# Patient Record
Sex: Female | Born: 1950 | Race: Black or African American | Hispanic: No | Marital: Single | Smoking: Never smoker
Health system: Southern US, Community
[De-identification: ages and names within clinical notes are randomized; demographics above are authoritative.]

## PROBLEM LIST (undated history)

## (undated) DIAGNOSIS — I773 Arterial fibromuscular dysplasia: Secondary | ICD-10-CM

## (undated) DIAGNOSIS — E7800 Pure hypercholesterolemia, unspecified: Secondary | ICD-10-CM

## (undated) DIAGNOSIS — G7102 Facioscapulohumeral muscular dystrophy: Secondary | ICD-10-CM

## (undated) DIAGNOSIS — K219 Gastro-esophageal reflux disease without esophagitis: Secondary | ICD-10-CM

## (undated) DIAGNOSIS — K635 Polyp of colon: Secondary | ICD-10-CM

## (undated) DIAGNOSIS — F419 Anxiety disorder, unspecified: Secondary | ICD-10-CM

## (undated) DIAGNOSIS — R51 Headache: Secondary | ICD-10-CM

## (undated) DIAGNOSIS — C50919 Malignant neoplasm of unspecified site of unspecified female breast: Secondary | ICD-10-CM

## (undated) DIAGNOSIS — Z923 Personal history of irradiation: Secondary | ICD-10-CM

## (undated) DIAGNOSIS — I10 Essential (primary) hypertension: Secondary | ICD-10-CM

## (undated) DIAGNOSIS — L501 Idiopathic urticaria: Secondary | ICD-10-CM

## (undated) HISTORY — PX: OTHER SURGICAL HISTORY: SHX169

## (undated) HISTORY — DX: Anxiety disorder, unspecified: F41.9

## (undated) HISTORY — DX: Essential (primary) hypertension: I10

## (undated) HISTORY — DX: Headache: R51

## (undated) HISTORY — DX: Pure hypercholesterolemia, unspecified: E78.00

## (undated) HISTORY — DX: Idiopathic urticaria: L50.1

## (undated) HISTORY — DX: Malignant neoplasm of unspecified site of unspecified female breast: C50.919

## (undated) HISTORY — PX: AUGMENTATION MAMMAPLASTY: SUR837

## (undated) HISTORY — PX: BREAST LUMPECTOMY: SHX2

## (undated) HISTORY — DX: Polyp of colon: K63.5

## (undated) HISTORY — DX: Facioscapulohumeral muscular dystrophy: G71.02

## (undated) HISTORY — DX: Gastro-esophageal reflux disease without esophagitis: K21.9

## (undated) HISTORY — PX: VAGINAL HYSTERECTOMY: SHX2639

## (undated) HISTORY — PX: POLYPECTOMY: SHX149

---

## 1999-05-19 ENCOUNTER — Other Ambulatory Visit: Admission: RE | Admit: 1999-05-19 | Discharge: 1999-05-19 | Payer: Self-pay | Admitting: Obstetrics and Gynecology

## 2000-10-17 ENCOUNTER — Other Ambulatory Visit: Admission: RE | Admit: 2000-10-17 | Discharge: 2000-10-17 | Payer: Self-pay | Admitting: Obstetrics and Gynecology

## 2001-12-13 ENCOUNTER — Other Ambulatory Visit: Admission: RE | Admit: 2001-12-13 | Discharge: 2001-12-13 | Payer: Self-pay | Admitting: Obstetrics and Gynecology

## 2005-04-05 ENCOUNTER — Ambulatory Visit: Payer: Self-pay | Admitting: Pulmonary Disease

## 2005-04-26 ENCOUNTER — Ambulatory Visit: Payer: Self-pay | Admitting: Gastroenterology

## 2005-05-09 ENCOUNTER — Ambulatory Visit: Payer: Self-pay | Admitting: Gastroenterology

## 2005-07-04 ENCOUNTER — Encounter (INDEPENDENT_AMBULATORY_CARE_PROVIDER_SITE_OTHER): Payer: Self-pay | Admitting: *Deleted

## 2005-07-04 ENCOUNTER — Ambulatory Visit (HOSPITAL_COMMUNITY): Admission: RE | Admit: 2005-07-04 | Discharge: 2005-07-04 | Payer: Self-pay | Admitting: Surgery

## 2005-12-09 ENCOUNTER — Ambulatory Visit: Payer: Self-pay | Admitting: Pulmonary Disease

## 2005-12-10 ENCOUNTER — Ambulatory Visit (HOSPITAL_COMMUNITY): Admission: RE | Admit: 2005-12-10 | Discharge: 2005-12-10 | Payer: Self-pay | Admitting: Pulmonary Disease

## 2006-12-16 ENCOUNTER — Emergency Department (HOSPITAL_COMMUNITY): Admission: EM | Admit: 2006-12-16 | Discharge: 2006-12-17 | Payer: Self-pay | Admitting: Emergency Medicine

## 2007-07-27 ENCOUNTER — Ambulatory Visit: Payer: Self-pay | Admitting: Pulmonary Disease

## 2007-12-20 DIAGNOSIS — K219 Gastro-esophageal reflux disease without esophagitis: Secondary | ICD-10-CM | POA: Insufficient documentation

## 2007-12-20 DIAGNOSIS — F411 Generalized anxiety disorder: Secondary | ICD-10-CM

## 2007-12-20 DIAGNOSIS — R51 Headache: Secondary | ICD-10-CM

## 2007-12-20 DIAGNOSIS — I10 Essential (primary) hypertension: Secondary | ICD-10-CM

## 2007-12-20 DIAGNOSIS — E7800 Pure hypercholesterolemia, unspecified: Secondary | ICD-10-CM

## 2007-12-20 DIAGNOSIS — R519 Headache, unspecified: Secondary | ICD-10-CM | POA: Insufficient documentation

## 2008-01-24 ENCOUNTER — Ambulatory Visit: Payer: Self-pay | Admitting: Pulmonary Disease

## 2008-01-24 DIAGNOSIS — I8390 Asymptomatic varicose veins of unspecified lower extremity: Secondary | ICD-10-CM

## 2008-01-24 DIAGNOSIS — L501 Idiopathic urticaria: Secondary | ICD-10-CM

## 2008-01-24 DIAGNOSIS — D126 Benign neoplasm of colon, unspecified: Secondary | ICD-10-CM | POA: Insufficient documentation

## 2008-01-28 ENCOUNTER — Ambulatory Visit: Payer: Self-pay | Admitting: Pulmonary Disease

## 2008-02-12 LAB — CONVERTED CEMR LAB
ALT: 22 units/L (ref 0–35)
AST: 29 units/L (ref 0–37)
Albumin: 3.5 g/dL (ref 3.5–5.2)
Alkaline Phosphatase: 73 units/L (ref 39–117)
BUN: 8 mg/dL (ref 6–23)
Basophils Absolute: 0 10*3/uL (ref 0.0–0.1)
Basophils Relative: 0.1 % (ref 0.0–1.0)
Bilirubin, Direct: 0.2 mg/dL (ref 0.0–0.3)
CO2: 33 meq/L — ABNORMAL HIGH (ref 19–32)
Calcium: 9.2 mg/dL (ref 8.4–10.5)
Chloride: 101 meq/L (ref 96–112)
Cholesterol: 234 mg/dL (ref 0–200)
Creatinine, Ser: 0.8 mg/dL (ref 0.4–1.2)
Direct LDL: 136.4 mg/dL
Eosinophils Absolute: 0.1 10*3/uL (ref 0.0–0.7)
Eosinophils Relative: 1.6 % (ref 0.0–5.0)
GFR calc Af Amer: 96 mL/min
GFR calc non Af Amer: 79 mL/min
Glucose, Bld: 83 mg/dL (ref 70–99)
HCT: 41.8 % (ref 36.0–46.0)
HDL: 88.8 mg/dL (ref >39.0)
Hemoglobin: 14 g/dL (ref 12.0–15.0)
Lymphocytes Relative: 22.7 % (ref 12.0–46.0)
MCHC: 33.6 g/dL (ref 30.0–36.0)
MCV: 91.5 fL (ref 78.0–100.0)
Monocytes Absolute: 0.3 10*3/uL (ref 0.1–1.0)
Monocytes Relative: 6.5 % (ref 3.0–12.0)
Neutro Abs: 3.2 10*3/uL (ref 1.4–7.7)
Neutrophils Relative %: 69.1 % (ref 43.0–77.0)
Platelets: 318 10*3/uL (ref 150–400)
Potassium: 4.1 meq/L (ref 3.5–5.1)
RBC: 4.56 M/uL (ref 3.87–5.11)
RDW: 12 % (ref 11.5–14.6)
Sodium: 141 meq/L (ref 135–145)
TSH: 1.5 microintl units/mL (ref 0.35–5.50)
Total Bilirubin: 0.8 mg/dL (ref 0.3–1.2)
Total CHOL/HDL Ratio: 2.6
Total Protein: 7 g/dL (ref 6.0–8.3)
Triglycerides: 67 mg/dL (ref 0–149)
VLDL: 13 mg/dL (ref 0–40)
WBC: 4.6 10*3/uL (ref 4.5–10.5)

## 2009-01-21 ENCOUNTER — Encounter: Payer: Self-pay | Admitting: Pulmonary Disease

## 2009-01-29 ENCOUNTER — Encounter: Payer: Self-pay | Admitting: Pulmonary Disease

## 2009-01-30 ENCOUNTER — Encounter: Payer: Self-pay | Admitting: Pulmonary Disease

## 2009-03-30 ENCOUNTER — Telehealth (INDEPENDENT_AMBULATORY_CARE_PROVIDER_SITE_OTHER): Payer: Self-pay | Admitting: *Deleted

## 2009-07-14 ENCOUNTER — Ambulatory Visit: Payer: Self-pay | Admitting: Pulmonary Disease

## 2009-07-17 ENCOUNTER — Ambulatory Visit: Payer: Self-pay | Admitting: Pulmonary Disease

## 2009-07-19 LAB — CONVERTED CEMR LAB
ALT: 15 units/L (ref 0–35)
AST: 25 units/L (ref 0–37)
Albumin: 3.8 g/dL (ref 3.5–5.2)
Alkaline Phosphatase: 68 units/L (ref 39–117)
BUN: 13 mg/dL (ref 6–23)
Basophils Absolute: 0 10*3/uL (ref 0.0–0.1)
Basophils Relative: 0 % (ref 0.0–3.0)
Bilirubin, Direct: 0.1 mg/dL (ref 0.0–0.3)
CO2: 31 meq/L (ref 19–32)
Calcium: 9.1 mg/dL (ref 8.4–10.5)
Chloride: 102 meq/L (ref 96–112)
Cholesterol: 222 mg/dL — ABNORMAL HIGH (ref 0–200)
Creatinine, Ser: 0.8 mg/dL (ref 0.4–1.2)
Direct LDL: 118 mg/dL
Eosinophils Absolute: 0.1 10*3/uL (ref 0.0–0.7)
Eosinophils Relative: 1.6 % (ref 0.0–5.0)
GFR calc non Af Amer: 95.3 mL/min (ref >60)
Glucose, Bld: 81 mg/dL (ref 70–99)
HCT: 36.6 % (ref 36.0–46.0)
HDL: 91.8 mg/dL (ref >39.00)
Hemoglobin: 12.6 g/dL (ref 12.0–15.0)
Lymphocytes Relative: 22.4 % (ref 12.0–46.0)
Lymphs Abs: 1 10*3/uL (ref 0.7–4.0)
MCHC: 34.5 g/dL (ref 30.0–36.0)
MCV: 92.5 fL (ref 78.0–100.0)
Monocytes Absolute: 0.3 10*3/uL (ref 0.1–1.0)
Monocytes Relative: 7.9 % (ref 3.0–12.0)
Neutro Abs: 2.9 10*3/uL (ref 1.4–7.7)
Neutrophils Relative %: 68.1 % (ref 43.0–77.0)
Platelets: 255 10*3/uL (ref 150.0–400.0)
Potassium: 3.8 meq/L (ref 3.5–5.1)
RBC: 3.95 M/uL (ref 3.87–5.11)
RDW: 11.8 % (ref 11.5–14.6)
Sodium: 145 meq/L (ref 135–145)
TSH: 0.97 microintl units/mL (ref 0.35–5.50)
Total Bilirubin: 0.6 mg/dL (ref 0.3–1.2)
Total CHOL/HDL Ratio: 2
Total Protein: 7.6 g/dL (ref 6.0–8.3)
Triglycerides: 63 mg/dL (ref 0.0–149.0)
VLDL: 12.6 mg/dL (ref 0.0–40.0)
WBC: 4.3 10*3/uL — ABNORMAL LOW (ref 4.5–10.5)

## 2009-10-15 ENCOUNTER — Telehealth (INDEPENDENT_AMBULATORY_CARE_PROVIDER_SITE_OTHER): Payer: Self-pay | Admitting: *Deleted

## 2010-01-01 ENCOUNTER — Encounter: Payer: Self-pay | Admitting: Pulmonary Disease

## 2010-07-14 ENCOUNTER — Ambulatory Visit: Payer: Self-pay | Admitting: Pulmonary Disease

## 2010-07-14 ENCOUNTER — Encounter (INDEPENDENT_AMBULATORY_CARE_PROVIDER_SITE_OTHER): Payer: Self-pay | Admitting: *Deleted

## 2010-07-30 ENCOUNTER — Encounter (INDEPENDENT_AMBULATORY_CARE_PROVIDER_SITE_OTHER): Payer: Self-pay | Admitting: *Deleted

## 2010-08-04 ENCOUNTER — Ambulatory Visit: Payer: Self-pay | Admitting: Gastroenterology

## 2010-09-19 HISTORY — PX: COLONOSCOPY: SHX5424

## 2010-10-11 ENCOUNTER — Encounter: Payer: Self-pay | Admitting: Obstetrics and Gynecology

## 2010-10-19 NOTE — Miscellaneous (Signed)
Summary: [facility] Previsit/prep  Clinical Lists Changes  Medications: Added new medication of MOVIPREP 100 GM  SOLR (PEG-KCL-NACL-NASULF-NA ASC-C) As per prep instructions. - Signed Rx of MOVIPREP 100 GM  SOLR (PEG-KCL-NACL-NASULF-NA ASC-C) As per prep instructions.;  #1 x 0;  Signed;  Entered by: Wyona Almas RN;  Authorized by: Mardella Layman MD [facility];  Method used: [facility] [phone]*, [address] [place], [place]  [address], Ph: [phone] or [phone], Fax: [phone] Observations: Added new observation of NKA: T (08/04/2010 8:55)    Prescriptions: MOVIPREP 100 GM  SOLR (PEG-KCL-NACL-NASULF-NA ASC-C) As per prep instructions.  #1 x 0   Entered by:   Wyona Almas RN   Authorized by:   Mardella Layman MD [facility]   Signed by:   Wyona Almas RN on 08/04/2010   Method used:   [facility]ronically to        [facility] [phone]* (retail)       [address]       [place], [place]  [address]       Ph: [phone] or [phone]       Fax: [phone]   RxID:   [id number]

## 2010-10-19 NOTE — Letter (Signed)
Summary: Moviprep Instructions  [facility]  [address]   [place], [place] [address]   Phone: [phone]  Fax: [phone]       TENIESHA STAUCH    26-May-1951    MRN: [id number]        Procedure Day Dorna Bloom: Wednesday, 08-25-10     Arrival Time: 8:30 a.m.     Procedure Time: 9:30 a.m.     Location of Procedure:                    x   [facility] (4th Floor)  PREPARATION FOR COLONOSCOPY WITH MOVIPREP   Starting 5 days prior to your procedure 08-20-10 do not eat nuts, seeds, popcorn, corn, beans, peas,  salads, or any raw vegetables.  Do not take any fiber supplements (e.g. Metamucil, Citrucel, and Benefiber).  THE DAY BEFORE YOUR PROCEDURE         DATE: 08-24-10   DAY: Tuesday  1.  Drink clear liquids the entire day-NO SOLID FOOD  2.  Do not drink anything colored red or purple.  Avoid juices with pulp.  No orange juice.  3.  Drink at least 64 oz. (8 glasses) of fluid/clear liquids during the day to prevent dehydration and help the prep work efficiently.  CLEAR LIQUIDS INCLUDE: Water Jello Ice Popsicles Tea (sugar ok, no milk/cream) Powdered fruit flavored drinks Coffee (sugar ok, no milk/cream) Gatorade Juice: apple, white grape, white cranberry  Lemonade Clear bullion, consomm, broth Carbonated beverages (any kind) Strained chicken noodle soup Hard Candy                             4.  In the morning, mix first dose of MoviPrep solution:    Empty 1 Pouch A and 1 Pouch B into the disposable container    Add lukewarm drinking water to the top line of the container. Mix to dissolve    Refrigerate (mixed solution should be used within 24 hrs)  5.  Begin drinking the prep at 5:00 p.m. The MoviPrep container is divided by 4 marks.   Every 15 minutes drink the solution down to the next mark (approximately 8 oz) until the full liter is complete.   6.  Follow completed prep with 16 oz of clear liquid of your choice (Nothing red or purple).   Continue to drink clear liquids until bedtime.  7.  Before going to bed, mix second dose of MoviPrep solution:    Empty 1 Pouch A and 1 Pouch B into the disposable container    Add lukewarm drinking water to the top line of the container. Mix to dissolve    Refrigerate  THE DAY OF YOUR PROCEDURE      DATE: 08-25-10  DAY: Wednesday  Beginning at 4:30 a.m. (5 hours before procedure):         1. Every 15 minutes, drink the solution down to the next mark (approx 8 oz) until the full liter is complete.  2. Follow completed prep with 16 oz. of clear liquid of your choice.    3. You may drink clear liquids until  7:30 a.m. (2 HOURS BEFORE PROCEDURE).   MEDICATION INSTRUCTIONS  Unless otherwise instructed, you should take regular prescription medications with a small sip of water   as early as possible the morning of your procedure.   Additional medication instructions: Hold HCTZ the morning of procedure.  OTHER INSTRUCTIONS  You will need a responsible adult at least 58 years of age to accompany you and drive you home.   This person must remain in the waiting room during your procedure.  Wear loose fitting clothing that is easily removed.  Leave jewelry and other valuables at home.  However, you may wish to bring a book to read or  an iPod/MP3 player to listen to music as you wait for your procedure to start.  Remove all body piercing jewelry and leave at home.  Total time from sign-in until discharge is approximately 2-3 hours.  You should go home directly after your procedure and rest.  You can resume normal activities the  day after your procedure.  The day of your procedure you should not:   Drive   Make legal decisions   Operate machinery   Drink alcohol   Return to work  You will receive specific instructions about eating, activities and medications before you leave.    The above instructions have been reviewed and explained to me by   Wyona Almas RN  August 04, 2010 9:30 AM    I fully understand and can verbalize these instructions _____________________________ Date _________

## 2010-10-19 NOTE — Progress Notes (Signed)
Summary: Change Avalide-LMTCB x 2  Phone Note Outgoing Call   Call placed by: Michel Bickers CMA,  October 15, 2009 5:05 PM Call placed to: Patient Summary of Call: Form received from the pharmacy requesting to change Avalide 150-12.5mg  due to recall. They are wanting to give the pt Avapro 150mg  and HCTZ 12.5mg  separately. SN is ok with this if the patient agrees. There are other alternate combination therapies if the patient wants to contiune on the combination therapy.LMOMTCB.  LMOMTCB Vernie Murders  October 16, 2009 9:32 AM  Initial call taken by: Michel Bickers CMA,  October 15, 2009 5:08 PM  Follow-up for Phone Call        called and spoke with pt and the pharmacy has not contacted her about this but she is fine with the change in meds if its ok with SN.. please advise Randell Loop CMA  October 16, 2009 10:52 AM    this was ok with SN to change this one med to the 2 meds and they have been sent to her pharmacy. lmom to make pt aware of the change. Randell Loop CMA  October 16, 2009 11:14 AM     New/Updated Medications: AVAPRO 150 MG TABS (IRBESARTAN) Take 1 tablet by mouth once a day HYDROCHLOROTHIAZIDE 12.5 MG CAPS (HYDROCHLOROTHIAZIDE) take one capsule by mouth once daily Prescriptions: HYDROCHLOROTHIAZIDE 12.5 MG CAPS (HYDROCHLOROTHIAZIDE) take one capsule by mouth once daily  #30 x 6   Entered by:   Randell Loop CMA   Authorized by:   Michele Mcalpine MD   Signed by:   Randell Loop CMA on 10/16/2009   Method used:   [facility]        [facility] [phone]* (retail)       [address]       [place], [place]  [address]       Ph: [phone] or [phone]       Fax: [phone]   RxID:   [id number] AVAPRO 150 MG TABS (IRBESARTAN) Take 1 tablet by mouth once a day  #30 x 6   Entered by:   Randell Loop CMA   Authorized by:   Michele Mcalpine MD   Signed by:   Randell Loop CMA on 10/16/2009   Method used:   Electronically to        [facility] [phone]* (retail)  [address]       [place], [place]  [address]       Ph: [phone] or [phone]       Fax: [phone]   RxID:   [id number]

## 2010-10-19 NOTE — Assessment & Plan Note (Signed)
Summary: 12 months/apc-pt req flu shot as well//kp   CC:  Yearly ROV & review of mult medical problems....  History of Present Illness: 60 y/o BF here for a follow up visit... she has mult med problems as noted....    ~  July 14, 2009:  she has been under some stress caring for her mother, Janyth Pupa... she is c/o being tired- not weak, exercises daily by walking her dog (husky)... BP well controlled on the Avalide & overdue for FLP to check her cholesterol... OK flu shot today.   ~  July 14, 2010:  she has had a good yr- no new complaints or concerns, just incr stress w/ mother... her insurance comp changed her Avalide to Avapro + HCTZ for some reason- BP reamins well controlled;  she has hx sl elev TChol/ LDL but good HDL & she doesn't want meds Rx (committed to diet alone);  weight= 126# is down 9# in the last yr;  GI is stable & she is due for 27yr f/u colon- refer to DrPatterson;  she is on Premarin, black cohosh, calcium, vits per GYN... OK 2011 Flu vaccine & TDAP booster today...    Current Problem List:  HYPERTENSION (ICD-401.9) - on AVAPRO 150mg /d + HCTZ 12.5mg /d w/ good control... BP= 126/78, tolerating rx well... denies HA, fatigue, visual changes, CP, palipit, dizziness, syncope, dyspnea, edema, etc...  VARICOSE VEINS, LOWER EXTREMITIES (ICD-454.9) - she has been eval at the Vein clinic and had VV surg by DrFeatherston... she avoids sodium, elevates legs, wears support hose...  HYPERCHOLESTEROLEMIA (ICD-272.0) - on diet alone and has a high HDL...  ~  last FLP was 6/05 showing TChol 236, Tg 148, HDL 83, LDL 125... I told her she may need a low dose statin med to get her TChol & LDL to goal...  ~  FLP 10/10 showed TChol 222, TG 63, HDL 92, LDL 118... rec better diet efforit.  GERD (ICD-530.81) - occas reflux symptoms treated w/ Prilosec vs Zantac OTC...  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 follow up planned 78yrs & we will refer to GI for this important screening procedure.  HEADACHE (ICD-784.0) - she believes related to stress...  ANXIETY (ICD-300.00) - she uses LORAZEPAM 0.5mg  as needed for anxiety... eg. during storms etc...  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  Health Maintenance:  Also takes ASA 81mg /d... GYN= DrHorvath, on Premarin 0.625mg /d, s/p hysterectomy... Mammograms yearly at [facility] (she has implants)- and they follow closely... she gets the seasonal Flu vaccine yearly... given TDAP booster 10/11...   Preventive Screening-Counseling & Management  Alcohol-Tobacco     Smoking Status: never  Allergies (verified): No Known Drug Allergies  Comments:  Nurse/Medical Assistant: The patient's medications and allergies were reviewed with the patient and were updated in the Medication and Allergy Lists.  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 Father alive age 27 w/ hx ASHD, back surg Mother, Addrenna  Rivers, alive age 60 w/ HBP, Chol, DM, breast cancer, DVT/PE... 3 Siblings- all sisters, one w/ back surg  Social History: Reviewed history from 07/14/2009 and no changes required. Divorced No children never smoked social alcohol coffee- 3 cups  Review of Systems  The patient denies anorexia, fever, weight loss, weight  gain, vision loss, decreased hearing, hoarseness, chest pain, syncope, dyspnea on exertion, peripheral edema, prolonged cough, headaches, hemoptysis, abdominal pain, melena, hematochezia, severe indigestion/heartburn, hematuria, incontinence, muscle weakness, suspicious skin lesions, transient blindness, difficulty walking, depression, unusual weight change, abnormal bleeding, enlarged lymph nodes, and angioedema.    Vital Signs:  Patient profile:   60 year old female Height:      63 inches Weight:      125.50 pounds BMI:     22.31 O2 Sat:      100 % on Room air Temp:     98.5 degrees F oral Pulse rate:   72 / minute BP sitting:   126 / 78  (left arm) Cuff size:   regular  Vitals Entered By: Randell Loop CMA (July 14, 2010 2:05 PM)  O2 Sat at Rest %:  100 O2 Flow:  Room air CC: Yearly ROV & review of mult medical problems... Is Patient Diabetic? No Pain Assessment Patient in pain? no      Comments meds updated today with pt   Physical Exam  Additional Exam:  WD, WN, 59 y/o BF in NAD... GENERAL:  Alert & oriented; pleasant & cooperative... HEENT:  Cecilia/AT, EOM-wnl, PERRLA, EACs-clear, TMs-wnl, NOSE-clear, THROAT-clear & wnl. NECK:  Supple w/ full ROM; no JVD; normal carotid impulses w/o bruits; no thyromegaly or nodules palpated; no lymphadenopathy. CHEST:  Clear to P & A; without wheezes/ rales/ or rhonchi. HEART:  Regular Rhythm; without murmurs/ rubs/ or gallops. ABDOMEN:  Soft & nontender; normal bowel sounds; no organomegaly or masses detected. EXT: without deformities or arthritic changes; no varicose veins/ venous insuffic/ or edema. NEURO:  CN's intact; motor testing normal; sensory testing normal; gait normal & balance OK. DERM:  No lesions noted; no rash etc...    Impression & Recommendations:  Problem # 1:  HYPERTENSION (ICD-401.9) Well controlled>  continue same. Her updated medication list for this problem includes:    Avapro 150 Mg Tabs (Irbesartan) .Marland Kitchen...  Take 1 tablet by mouth once a day    Hydrochlorothiazide 12.5 Mg Caps (Hydrochlorothiazide) .Marland Kitchen... Take one capsule by mouth once daily  Problem # 2:  HYPERCHOLESTEROLEMIA (ICD-272.0) She has elev TChol & LDL, but excellent HDL & is not inclined to take meds... discussed low chol/ low fat diet & exercise...  Problem # 3:  COLONIC POLYPS (ICD-211.3) She is due for colonoscopy w/ DrPatterson & has not received notification from the GI dept- we will refer for this important sceeening procedure. Orders: Gastroenterology Referral (GI)  Problem # 4:  OTHER MEDICAL PROBLEMS AS NOTED>>> OK Flu vaccine & TDAP today...  Complete Medication List: 1)  Avapro 150 Mg Tabs (Irbesartan) .... Take 1 tablet by mouth once a day 2)  Hydrochlorothiazide 12.5 Mg Caps (Hydrochlorothiazide) .... Take one capsule by mouth once daily 3)  Premarin 0.625 Mg Tabs (Estrogens conjugated) .... Take 1/2 tablet by mouth once a day 4)  Black Cohosh 200 Mg Caps (Black cohosh) .... Take 1 tablet by mouth once a day 5)  Lorazepam 0.5 Mg Tabs (Lorazepam) .... Take 1/2 to 1 tab by mouth up to three times a day as needed for nerves...  Other Orders: Tdap =>  40yrs IM [phone]) Admin 1st Vaccine ([address]454) Admin 1st Vaccine (09811) Flu Vaccine 97yrs + [phone])  Patient Instructions: 1)  Today we updated your med list- see below.... 2)  we refilled your perscriptions as requested... 3)  today we gave you the 2011 Flu vaccine and the TDAP (combination tetanus & whooping cough vaccine- good for 37yrs)... 4)  Call for any problems.Marland KitchenMarland Kitchen 5)  We will ask DrPatterson to check into your f/u colonoscopy... 6)  Please schedule a follow-up appointment in 1 year. Prescriptions: LORAZEPAM 0.5 MG TABS (LORAZEPAM) take 1/2 to 1 tab by mouth up to three times a day as needed for nerves...  #90 x 12   Entered and Authorized by:   Michele Mcalpine MD   Signed by:   Michele Mcalpine MD on 07/14/2010   Method used:   Print then Give to Patient   RxID:    [id number] HYDROCHLOROTHIAZIDE 12.5 MG CAPS (HYDROCHLOROTHIAZIDE) take one capsule by mouth once daily  #30 x 12   Entered and Authorized by:   Michele Mcalpine MD   Signed by:   Michele Mcalpine MD on 07/14/2010   Method used:   Print then Give to Patient   RxID:   [id number] AVAPRO 150 MG TABS (IRBESARTAN) Take 1 tablet by mouth once a day  #30 x 12   Entered and Authorized by:   Michele Mcalpine MD   Signed by:   Michele Mcalpine MD on 07/14/2010   Method used:   Print then Give to Patient   RxID:   [id number]    Immunizations Administered:  Tetanus Vaccine:    Vaccine Type: Tdap    Site: right deltoid    Mfr: boostrix    Dose: 0.5 ml    Route: IM    Given by: Randell Loop CMA    Exp. Date: 06/09/2012    Lot #: [id number]    VIS given: 08/06/08 version given July 14, 2010.  Flu Vaccine Consent Questions     Do you have a history of severe allergic reactions to this vaccine? no    Any prior history of allergic reactions to egg and/or gelatin? no    Do you have a sensitivity to the preservative Thimersol? no    Do you have a past history of Guillan-Barre Syndrome? no    Do you currently have an acute febrile illness? no    Have you ever had a severe reaction to latex? no    Vaccine information given and explained to patient? yes    Are you currently pregnant? no    Lot Number:[id number]   Exp Date:03/19/2011   Site Given  Left Deltoid IMu1    Randell Loop [facility]  July 14, 2010 3:41 PM

## 2010-10-19 NOTE — Letter (Signed)
Summary: Pre Visit Letter Revised  [facility]  [address] [place], [place] [address]   Phone: [phone]  Fax: [phone]        07/14/2010 MRN: [id number] [name] [name] [address] [facility] [address] [place], [place]  [address]             Procedure Date:  08-25-10   Welcome to the Gastroenterology Division at [facility].    You are scheduled to see a nurse for your pre-procedure visit on 08-04-10 at 9:00a.m. on the 3rd floor at [facility], [address]  We ask that you try to arrive at our office 15 minutes prior to your appointment time to allow for check-in.  Please take a minute to review the attached form.  If you answer "Yes" to one or more of the questions on the first page, we ask that you call the person listed at your earliest opportunity.  If you answer "No" to all of the questions, please complete the rest of the form and bring it to your appointment.    Your nurse visit will consist of discussing your medical and surgical history, your immediate family medical history, and your medications.   If you are unable to list all of your medications on the form, please bring the medication bottles to your appointment and we will list them.  We will need to be aware of both prescribed and over the counter drugs.  We will need to know exact dosage information as well.    Please be prepared to read and sign documents such as consent forms, a financial agreement, and acknowledgement forms.  If necessary, and with your consent, a friend or relative is welcome to sit-in on the nurse visit with you.  Please bring your insurance card so that we may make a copy of it.  If your insurance requires a referral to see a specialist, please bring your referral form from your primary care physician.  No co-pay is required for this nurse visit.     If you cannot keep your appointment, please call [phone] to cancel or reschedule prior to your appointment date.  This allows  [place] the opportunity to schedule an appointment for another patient in need of care.    Thank you for choosing [facility] for your medical needs.  We appreciate the opportunity to care for you.  Please visit [place] at our website  to learn more about our practice.  Sincerely, The Gastroenterology Division

## 2010-11-24 ENCOUNTER — Telehealth: Payer: Self-pay | Admitting: Pulmonary Disease

## 2010-11-30 NOTE — Progress Notes (Signed)
Summary: returning call  Phone Note Call from Patient Call back at Work Phone [phone]   Caller: Patient Call For: Chyrel Taha Summary of Call: Pt returning call to Dr. Kriste Basque states she can be reached at [phone]. Initial call taken by: Darletta Moll,  November 24, 2010 10:47 AM  Follow-up for Phone Call        SN is aware and will call the pt back today Randell Loop CMA  November 24, 2010 2:22 PM

## 2011-02-04 NOTE — Op Note (Signed)
[name] [name], [name] [name]                ACCOUNT NO.:  [id number]   MEDICAL RECORD NO.:  [id number]          PATIENT TYPE:  AMB   LOCATION:  DAY                          FACILITY:  [facility]   PHYSICIAN:  Thomas A. Cornett, M.D.DATE OF BIRTH:  02-02-1951   DATE OF PROCEDURE:  07/04/2005  DATE OF DISCHARGE:                                 OPERATIVE REPORT   PREOPERATIVE DIAGNOSIS:  Anal skin tag.   POSTOPERATIVE DIAGNOSIS:  Large anal polyp as well as internal and external  hemorrhoid.   PROCEDURE:  Wide excision of large anal polyp.   SURGEON:  Maisie Fus A. Cornett, M.D.   ANESTHESIA:  MAC with 20 mL of 0.25% Sensorcaine local.   ESTIMATED BLOOD LOSS:  10 mL.   SPECIMEN:  Large pedunculated anal polyp to pathology with internal  hemorrhoid and external hemorrhoid complex.   INDICATIONS FOR PROCEDURE:  The patient is a 60 year old female who has had  a large anal skin tag. When I saw her in the office, this appeared to be  originating from the anal canal or just inside of it in the distal rectum.  Colonoscopy revealed this. She also had a large external hemorrhoid  associated with it. I recommended excision since it is giving her problems  with hygiene. After explanation of the procedure as well as potential  complications, she agreed to proceed.   DESCRIPTION OF PROCEDURE:  The patient was brought to the operating room and  placed in lithotomy. MAC anesthesia was initiated. The perineum was prepped  and draped in sterile fashion. The region around the base of the anal polyp  was anesthetized with 0.25% Sensorcaine. I grabbed the polyp with my fingers  and used the electrocautery to dissect it as well as the distal end of an  internal hemorrhoid as well as an external hemorrhoid complex in the  posterior midline or just right of the posterior midline it appeared. This  was done with good hemostasis. Specimen was sent to pathology for  evaluation. I inspected the area of the anoderm where  it was excised and  this appeared to be hemostatic. There appeared be part of an internal  hemorrhoid associated with this well. Of  note, she did have some small grade III internal hemorrhoid in the left  lateral position. Gelfoam was placed for hemostasis. I used 2% lidocaine  jelly as well. At this point in time sponge, needle and instrument counts  were counted and found to be correct. The patient was awoke and taken to  recovery in satisfactory condition.      Thomas A. Cornett, M.D.  Electronically Signed     TAC/MEDQ  D:  07/04/2005  T:  07/04/2005  Job:  [id number]   cc:   Lonzo Cloud. Kriste Basque, M.D. [facility]  [address]  [place]  [place] [address]   Dr. Eloise Harman or [facility]

## 2011-05-19 ENCOUNTER — Other Ambulatory Visit: Payer: Self-pay | Admitting: Pulmonary Disease

## 2011-06-10 ENCOUNTER — Encounter: Payer: Self-pay | Admitting: Adult Health

## 2011-06-13 ENCOUNTER — Ambulatory Visit (INDEPENDENT_AMBULATORY_CARE_PROVIDER_SITE_OTHER): Payer: Federal, State, Local not specified - PPO

## 2011-06-13 ENCOUNTER — Encounter: Payer: Self-pay | Admitting: Adult Health

## 2011-06-13 ENCOUNTER — Encounter: Payer: Self-pay | Admitting: Gastroenterology

## 2011-06-13 ENCOUNTER — Ambulatory Visit (INDEPENDENT_AMBULATORY_CARE_PROVIDER_SITE_OTHER): Payer: Federal, State, Local not specified - PPO | Admitting: Adult Health

## 2011-06-13 ENCOUNTER — Encounter: Payer: Self-pay | Admitting: *Deleted

## 2011-06-13 VITALS — BP 126/78 | HR 63 | Temp 97.6°F | Ht 64.0 in | Wt 119.4 lb

## 2011-06-13 DIAGNOSIS — R5381 Other malaise: Secondary | ICD-10-CM

## 2011-06-13 DIAGNOSIS — R5383 Other fatigue: Secondary | ICD-10-CM

## 2011-06-13 DIAGNOSIS — Z86010 Personal history of colonic polyps: Secondary | ICD-10-CM

## 2011-06-13 DIAGNOSIS — Z23 Encounter for immunization: Secondary | ICD-10-CM

## 2011-06-13 LAB — BASIC METABOLIC PANEL
BUN: 12 mg/dL (ref 6–23)
CO2: 31 mEq/L (ref 19–32)
Calcium: 9.3 mg/dL (ref 8.4–10.5)
Chloride: 102 mEq/L (ref 96–112)
Creatinine, Ser: 0.8 mg/dL (ref 0.4–1.2)
GFR: 90.72 mL/min (ref >60.00)
Glucose, Bld: 80 mg/dL (ref 70–99)
Potassium: 3 mEq/L — ABNORMAL LOW (ref 3.5–5.1)
Sodium: 142 mEq/L (ref 135–145)

## 2011-06-13 LAB — CBC WITH DIFFERENTIAL/PLATELET
Basophils Absolute: 0 10*3/uL (ref 0.0–0.1)
Basophils Relative: 0.5 % (ref 0.0–3.0)
Eosinophils Absolute: 0 10*3/uL (ref 0.0–0.7)
HCT: 39.5 % (ref 36.0–46.0)
Hemoglobin: 13.1 g/dL (ref 12.0–15.0)
Lymphocytes Relative: 24 % (ref 12.0–46.0)
Lymphs Abs: 1.1 10*3/uL (ref 0.7–4.0)
MCHC: 33.3 g/dL (ref 30.0–36.0)
Monocytes Absolute: 0.5 10*3/uL (ref 0.1–1.0)
Monocytes Relative: 10.7 % (ref 3.0–12.0)
Neutro Abs: 2.9 10*3/uL (ref 1.4–7.7)
Neutrophils Relative %: 64 % (ref 43.0–77.0)
Platelets: 307 10*3/uL (ref 150.0–400.0)
RBC: 4.26 Mil/uL (ref 3.87–5.11)
RDW: 13.4 % (ref 11.5–14.6)

## 2011-06-13 LAB — HEPATIC FUNCTION PANEL
ALT: 23 U/L (ref 0–35)
AST: 32 U/L (ref 0–37)
Albumin: 4 g/dL (ref 3.5–5.2)
Alkaline Phosphatase: 67 U/L (ref 39–117)
Bilirubin, Direct: 0.1 mg/dL (ref 0.0–0.3)
Total Bilirubin: 0.4 mg/dL (ref 0.3–1.2)
Total Protein: 7.4 g/dL (ref 6.0–8.3)

## 2011-06-13 LAB — IBC PANEL
Iron: 68 ug/dL (ref 42–145)
Saturation Ratios: 16.6 % — ABNORMAL LOW (ref 20.0–50.0)
Transferrin: 292.6 mg/dL (ref 212.0–360.0)

## 2011-06-13 NOTE — Patient Instructions (Signed)
Avoid high amounts of caffeine, avoid energy drinks/supplements Increase water in take.  If possible switch back to day shift and cut down to 5 days a week.  I will call with with labs.  We are referring you to GI for routine colonoscopy.  Flu shot today  follow up Dr. Kriste Basque  In 3 months for physical.

## 2011-06-13 NOTE — Progress Notes (Signed)
Subjective:    Patient ID: [name] [name], female    DOB: [birth year], 60 y.o.   MRN: [id number]  HPI 36 AAF  ~ July 14, 2009: she has been under some stress caring for her mother, Janyth Pupa... she is c/o being tired- not weak, exercises daily by walking her dog (husky)... BP well controlled on the Avalide & overdue for FLP to check her cholesterol... OK flu shot today.   ~ July 14, 2010: she has had a good yr- no new complaints or concerns, just incr stress w/ mother... her insurance comp changed her Avalide to Avapro + HCTZ for some reason- BP reamins well controlled; she has hx sl elev TChol/ LDL but good HDL & she doesn't want meds Rx (committed to diet alone); weight= 126# is down 9# in the last yr; GI is stable & she is due for 55yr f/u colon- refer to DrPatterson; she is on Premarin, black cohosh, calcium, vits per GYN... OK 2011 Flu vaccine & TDAP booster today...   06/13/2011 Acute OV  Pt presents for a work in visit. Complains of  extreme fatigue for last 2-3 months . Sleeping 10 hrs / night. Feels tired all the time, falls to sleep as soon as she lies down in the bed. She denies  new meds. Takes ativan rarely. Under a lot of stress. With elderly /sick parents.   Works for [employer], recently moved  to night shift due to budget cuts  With more hours each day/week. Working 6 days a week now. On her day off can sleep all day.   Taking energy supplements , energy drinks which help some.  No depression .  No chest pain, bloody stools, heat/cold intolerance, rash or vomitting.  Weight is down few pounds.  Did not go for referral to GI for colonoscopy last year.    PMH : :  HYPERTENSION (ICD-401.9) - on AVAPRO 150mg /d + HCTZ 12.5mg /d w/ good control.   VARICOSE VEINS, LOWER EXTREMITIES (ICD-454.9) - she has been eval at the Vein clinic and had VV surg by DrFeatherston... she avoids sodium, elevates legs, wears support hose...   HYPERCHOLESTEROLEMIA (ICD-272.0) - on diet alone  and has a high HDL...  ~ last FLP was 6/05 showing TChol 236, Tg 148, HDL 83, LDL 125... I told her she may need a low dose statin med to get her TChol & LDL to goal...  ~ FLP 10/10 showed TChol 222, TG 63, HDL 92, LDL 118... rec better diet efforit.   GERD (ICD-530.81) - occas reflux symptoms treated w/ Prilosec vs Zantac OTC...   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  Referred 2011 for follow up colon however did not go.   HEADACHE (ICD-784.0) - she believes related to stress...   ANXIETY (ICD-300.00) - she uses LORAZEPAM 0.5mg  as needed for anxiety... eg. during storms etc...   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   Health Maintenance: Also takes ASA 81mg /d... GYN= DrHorvath, on Premarin 0.625mg /d, s/p hysterectomy... Mammograms yearly [facility] (she has implants)- and they follow closely... she gets the seasonal Flu vaccine yearly... given TDAP booster 10/11...     Review of Systems Constitutional:   No  weight loss, night sweats,  Fevers, chills  HEENT:   No headaches,  Difficulty swallowing,  Tooth/dental problems, or  Sore throat,  No sneezing, itching, ear ache, nasal congestion, post nasal drip,   CV:  No chest pain,  Orthopnea, PND, swelling in lower extremities, anasarca, dizziness, palpitations, syncope.   GI  No heartburn, indigestion, abdominal pain, nausea, vomiting, diarrhea, change in bowel habits, loss of appetite, bloody stools.   Resp: No shortness of breath with exertion or at rest.  No excess mucus, no productive cough,  No non-productive cough,  No coughing up of blood.  No change in color of mucus.  No wheezing.  No chest wall deformity  Skin: no rash or lesions.  GU: no dysuria, change in color of urine, no urgency or frequency.  No flank pain, no hematuria   MS:  No  joint pain or swelling.  No decreased range of motion.  No back pain.  Psych:  No change in mood or affect. No depression or anxiety.  No memory loss.         Objective:   Physical Exam GEN: A/Ox3; pleasant , NAD, well nourished   HEENT:  [place]/AT,  EACs-clear, TMs-wnl, NOSE-clear, THROAT-clear, no lesions, no postnasal drip or exudate noted.   NECK:  Supple w/ fair ROM; no JVD; normal carotid impulses w/o bruits; no thyromegaly or nodules palpated; no lymphadenopathy.  RESP  Clear  P & A; w/o, wheezes/ rales/ or rhonchi.no accessory muscle use, no dullness to percussion  CARD:  RRR, no m/r/g  , no peripheral edema, pulses intact, no cyanosis or clubbing.  GI:   Soft & nt; nml bowel sounds; no organomegaly or masses detected.  Musco: Warm bil, no deformities or joint swelling noted.   Neuro: alert, no focal deficits noted.    Skin: Warm, no lesions or rashes         Assessment & Plan:

## 2011-06-13 NOTE — Assessment & Plan Note (Signed)
?  etiology -suspect secondary to shift work and increased workload.  Will check labs to rule out other possibily causes  Also needs colonoscopy -hx of polyps.   Discussed healthy sleep habits. Consideration if possible to change back to day shift if option arises.

## 2011-06-14 ENCOUNTER — Telehealth: Payer: Self-pay | Admitting: *Deleted

## 2011-06-14 MED ORDER — POTASSIUM CHLORIDE CRYS ER 20 MEQ PO TBCR: 20.0000 meq | EXTENDED_RELEASE_TABLET | Freq: Two times a day (BID) | ORAL | Status: DC

## 2011-06-14 NOTE — Telephone Encounter (Signed)
PT notified of lab results and rx sent to pharmacy.

## 2011-06-14 NOTE — Telephone Encounter (Signed)
Message copied by Jenna Luo on Tue Jun 14, 2011 10:40 AM ------      Message from: Julio Sicks      Created: Mon Jun 13, 2011  3:30 PM       Blood count is normal, iron level is ok,       Thyroid is nml       K+ is low ,       REC: Kdur 1  Po daily #30 , prn refills.       Please contact office for sooner follow up if symptoms do not improve or worsen or seek emergency care

## 2011-06-27 ENCOUNTER — Encounter: Payer: Self-pay | Admitting: Pulmonary Disease

## 2011-07-11 ENCOUNTER — Ambulatory Visit (AMBULATORY_SURGERY_CENTER): Payer: Federal, State, Local not specified - PPO | Admitting: *Deleted

## 2011-07-11 DIAGNOSIS — Z1211 Encounter for screening for malignant neoplasm of colon: Secondary | ICD-10-CM

## 2011-07-11 DIAGNOSIS — Z86010 Personal history of colonic polyps: Secondary | ICD-10-CM

## 2011-07-11 NOTE — Progress Notes (Signed)
Pt has Moviprep from her last scheduled procedure (she cancelled last year due to mother's ill health).  She doesn't need an RX sent to pharmacy

## 2011-07-25 ENCOUNTER — Ambulatory Visit (AMBULATORY_SURGERY_CENTER): Payer: Federal, State, Local not specified - PPO | Admitting: Gastroenterology

## 2011-07-25 ENCOUNTER — Encounter: Payer: Self-pay | Admitting: Gastroenterology

## 2011-07-25 DIAGNOSIS — Z86010 Personal history of colonic polyps: Secondary | ICD-10-CM

## 2011-07-25 DIAGNOSIS — Z1211 Encounter for screening for malignant neoplasm of colon: Secondary | ICD-10-CM

## 2011-07-25 MED ORDER — SODIUM CHLORIDE 0.9 % IV SOLN: 500.0000 mL | INTRAVENOUS | Status: DC

## 2011-07-25 NOTE — Patient Instructions (Signed)
Please refer to the blue and neon green sheets for instructions regarding diet and activity for the rest of today.  Resume previous medications. 

## 2011-07-26 ENCOUNTER — Encounter: Payer: Self-pay | Admitting: *Deleted

## 2011-07-26 ENCOUNTER — Telehealth: Payer: Self-pay

## 2011-07-26 NOTE — Telephone Encounter (Signed)

## 2011-07-27 ENCOUNTER — Encounter: Payer: Self-pay | Admitting: *Deleted

## 2011-09-27 ENCOUNTER — Encounter: Payer: Self-pay | Admitting: Pulmonary Disease

## 2011-09-27 ENCOUNTER — Ambulatory Visit (INDEPENDENT_AMBULATORY_CARE_PROVIDER_SITE_OTHER): Payer: Federal, State, Local not specified - PPO | Admitting: Pulmonary Disease

## 2011-09-27 VITALS — BP 132/80 | HR 64 | Temp 97.5°F | Ht 64.0 in | Wt 122.4 lb

## 2011-09-27 DIAGNOSIS — F411 Generalized anxiety disorder: Secondary | ICD-10-CM

## 2011-09-27 DIAGNOSIS — I8390 Asymptomatic varicose veins of unspecified lower extremity: Secondary | ICD-10-CM

## 2011-09-27 DIAGNOSIS — I10 Essential (primary) hypertension: Secondary | ICD-10-CM

## 2011-09-27 DIAGNOSIS — E7800 Pure hypercholesterolemia, unspecified: Secondary | ICD-10-CM

## 2011-09-27 DIAGNOSIS — L501 Idiopathic urticaria: Secondary | ICD-10-CM

## 2011-09-27 DIAGNOSIS — Z0000 Encounter for general adult medical examination without abnormal findings: Secondary | ICD-10-CM

## 2011-09-27 DIAGNOSIS — D126 Benign neoplasm of colon, unspecified: Secondary | ICD-10-CM

## 2011-09-27 MED ORDER — HYDROCHLOROTHIAZIDE 12.5 MG PO CAPS: 12.5000 mg | ORAL_CAPSULE | ORAL | Status: DC

## 2011-09-27 MED ORDER — LORAZEPAM 0.5 MG PO TABS: ORAL_TABLET | ORAL | Status: DC

## 2011-09-27 MED ORDER — IRBESARTAN 150 MG PO TABS: 150.0000 mg | ORAL_TABLET | Freq: Every day | ORAL | Status: DC

## 2011-09-27 MED ORDER — POTASSIUM CHLORIDE CRYS ER 20 MEQ PO TBCR: EXTENDED_RELEASE_TABLET | ORAL | Status: DC

## 2011-09-27 NOTE — Patient Instructions (Signed)
Today we updated your med list in our EPIC system...    Continue your current medications the same...    We refilled your meds today...  Please return to our lab for your follow up CXR & fasting blood work...    Please call the PHONE TREE in a few days for your results...    Dial [id number] & when prompted enter your patient number followed by the # symbol...    Your patient number is:  [phone]  Call for any questions...  Let's plan a similar follow up exam in 1 year's time, sooner if needed for problems.Marland KitchenMarland Kitchen

## 2011-09-27 NOTE — Progress Notes (Signed)
Subjective:     Patient ID: [name] [name], female   DOB: Sep 05, 1951, 61 y.o.   MRN: [id number]  HPI 61 y/o BF here for a follow up visit... she has mult med problems as noted....   ~  July 14, 2009:  she has been under some stress caring for her mother, [name] [name]... she is c/o being tired- not weak, exercises daily by walking her dog (husky)... BP well controlled on the Avalide & overdue for FLP to check her cholesterol... OK flu shot today.  ~  July 14, 2010:  she has had a good yr- no new complaints or concerns, just incr stress w/ mother... her insurance comp changed her Avalide to Avapro + HCTZ for some reason- BP remains well controlled;  she has hx sl elev TChol/ LDL but good HDL & she doesn't want meds Rx (committed to diet alone);  weight= 126# is down 9# in the last yr;  GI is stable & she is due for 61yr f/u colon- refer to [name] [name];  she is on Premarin, black cohosh, calcium, vits per GYN... OK 2011 Flu vaccine & TDAP booster today...  ~  September 27, 2011:  Yearly ROV & CPX> [name] [name] reports a good yr w/o new complaints or concerns & she has a new Malamute puppy "[name] [name]";  She saw TP 9/12 w/ fatigue & labs showed K=3.0 (due to HCTZ), supplemented & feels better; we discussed checking CXR (she didn't go to basement for this film), EKG (NSR, rate60, WNL), & ret for fasting labs (she never did- we will call w/ reminder)... See Prob List>>          Problem List:  HYPERTENSION (ICD-401.9) - on AVAPRO 150mg /d + HCTZ 12.5mg /d & K20 added 9/12 (taking 1/2 tab daily she says) w/ good control...  ~  10/11:  BP= 126/78, tolerating rx well... denies HA, fatigue, visual changes, CP, palipit, dizziness, syncope, dyspnea, edema, etc... ~  1/13:  BP= 132/80 & her fatigue is resolved, feeling better, currently asymptomatic...  VARICOSE VEINS, LOWER EXTREMITIES (ICD-454.9) - she has been eval at the Vein clinic and had VV surg by [name] [name] (sclerotherapy w/o help she says)... she avoids  sodium, elevates legs, wears support hose...  HYPERCHOLESTEROLEMIA (ICD-272.0) - on diet alone and has a high HDL... ~  last FLP was 6/05 showing TChol 236, Tg 148, HDL 83, LDL 125... I told her she may need a low dose statin med to get her TChol & LDL to goal... ~  FLP 10/10 showed TChol 222, TG 63, HDL 92, LDL 118... rec better diet effort. ~  FLP 1/13 on diet alone ==> pending  GERD (ICD-530.81) - occas reflux symptoms treated w/ Prilosec vs Zantac OTC...  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 follow up planned 61yrs & we will refer to GI for this important screening procedure. ~  F/u colonoscopy done 11/12 by [name] [name] was neg> no polyps or cancers, no rectal mass, f/u suggested in 61yrs...  HEADACHE (ICD-784.0) - she believes related to stress...  ANXIETY (ICD-300.00) - she uses LORAZEPAM 0.5mg  as needed for anxiety... eg. during storms etc...  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  Health Maintenance:  Also takes ASA 81mg /d... GYN= [name] [name], on Premarin 0.625mg /d, s/p hysterectomy... Mammograms yearly at [facility] (she has implants)- and they follow closely... she gets the seasonal  Flu vaccine yearly... given TDAP booster 10/11...   Past Surgical History  Procedure Date  . Vaginal hysterectomy   . Bilateral breast implants   . Benign breast biopsy   . Varicose vein   . Cataract right eye w/ lens implant   . Excision of large anal polyp   . Colonoscopy   . Polypectomy     Outpatient Encounter Prescriptions as of 09/27/2011  Medication Sig Dispense Refill  . aspirin 81 MG tablet Take 81 mg by mouth daily.        . Black Cohosh 200 MG CAPS Take 1 capsule by mouth daily.        Marland Kitchen estrogens, conjugated, (PREMARIN) 0.3 MG tablet Take 0.3 mg by mouth daily. Take daily for 21 days then do not take for 7 days.        . hydrochlorothiazide (,MICROZIDE/HYDRODIURIL,) 12.5 MG capsule TAKE ONE CAPSULE BY MOUTH EVERY DAY  30 capsule  5  . irbesartan (AVAPRO) 150 MG tablet TAKE 1 TABLET BY MOUTH EVERY DAY  30 tablet  5  . LORazepam (ATIVAN) 0.5 MG tablet Take 1/2 to 1 tablet by mouth up to 3 times a day as needed for nerves       . potassium chloride SA (K-DUR,KLOR-CON) 20 MEQ tablet Take 1/2 tablet by mouth once daily       . DISCONTD: potassium chloride SA (K-DUR,KLOR-CON) 20 MEQ tablet Take 1 tablet (20 mEq total) by mouth 2 (two) times daily.  30 tablet  11    No Known Allergies   Current Medications, Allergies, Past Medical History, Past Surgical History, Family History, and Social History were reviewed in [employer] record.    Review of Systems       See HPI - all other systems neg except as noted...  The patient denies anorexia, fever, weight loss, weight gain, vision loss, decreased hearing, hoarseness, chest pain, syncope, dyspnea on exertion, peripheral edema, prolonged cough, headaches, hemoptysis, abdominal pain, melena, hematochezia, severe indigestion/heartburn, hematuria, incontinence, muscle weakness, suspicious skin lesions, transient blindness, difficulty walking, depression, unusual weight change, abnormal bleeding, enlarged lymph nodes, and angioedema.    Objective:   Physical Exam     WD, WN, 61 y/o BF in NAD... GENERAL:  Alert & oriented; pleasant & cooperative... HEENT:  [name] [name]/AT, EOM-wnl, PERRLA, EACs-clear, TMs-wnl, NOSE-clear, THROAT-clear & wnl. NECK:  Supple w/ full ROM; no JVD; normal carotid impulses w/o bruits; no thyromegaly or nodules palpated; no lymphadenopathy. CHEST:  Clear to P & A; without wheezes/ rales/ or rhonchi. HEART:  Regular Rhythm; without murmurs/ rubs/ or gallops. ABDOMEN:  Soft & nontender; normal bowel sounds; no organomegaly or masses detected. EXT: without deformities or arthritic changes; no varicose veins/ venous insuffic/  or edema. NEURO:  CN's intact; motor testing normal; sensory testing normal; gait normal & balance OK. DERM:  No lesions noted; no rash etc...  RADIOLOGY DATA:  Reviewed in the EPIC EMR & discussed w/ the patient...  LABORATORY DATA:  Reviewed in the EPIC EMR & discussed w/ the patient...   Assessment:     CPX>>  HBP>  Controlled on Avapro & HCT w/ K20 added 9/12 due to fatigue & hypokalemia; continue same, needs f/u labs...  VV, VI>  Prev eval at the Vein clinic w/ surg by [name] [name]...  CHOL>  On diet alone & needs f/u FLP, we are awaiting her ret to our lab for the FLP...  GI> GERD, Polyps>  She uses OTC acid suppressors &  had f/u co9lonoscopy 11/12 (neg)...  Hx HAs>  She denies recent HAs etc...  Anxiety>  She uses Lorazepam as needed & requests refill of this med...     Plan:     Patient's Medications  New Prescriptions   No medications on file  Previous Medications   ASPIRIN 81 MG TABLET    Take 81 mg by mouth daily.     BLACK COHOSH 200 MG CAPS    Take 1 capsule by mouth daily.     ESTROGENS, CONJUGATED, (PREMARIN) 0.3 MG TABLET    Take 0.3 mg by mouth daily. Take daily for 21 days then do not take for 7 days.   Modified Medications   Modified Medication Previous Medication   HYDROCHLOROTHIAZIDE (MICROZIDE) 12.5 MG CAPSULE hydrochlorothiazide (,MICROZIDE/HYDRODIURIL,) 12.5 MG capsule      Take 1 capsule (12.5 mg total) by mouth every morning.    TAKE ONE CAPSULE BY MOUTH EVERY DAY   IRBESARTAN (AVAPRO) 150 MG TABLET irbesartan (AVAPRO) 150 MG tablet      Take 1 tablet (150 mg total) by mouth daily.    TAKE 1 TABLET BY MOUTH EVERY DAY   LORAZEPAM (ATIVAN) 0.5 MG TABLET LORazepam (ATIVAN) 0.5 MG tablet      Take 1/2 to 1 tablet by mouth up to 3 times a day as needed for nerves    Take 1/2 to 1 tablet by mouth up to 3 times a day as needed for nerves    POTASSIUM CHLORIDE SA (K-DUR,KLOR-CON) 20 MEQ TABLET potassium chloride SA (K-DUR,KLOR-CON) 20 MEQ tablet       Take 1/2 tablet by mouth once daily    Take 1 tablet (20 mEq total) by mouth 2 (two) times daily.  Discontinued Medications   POTASSIUM CHLORIDE SA (K-DUR,KLOR-CON) 20 MEQ TABLET    Take 1/2 tablet by mouth once daily

## 2011-10-09 ENCOUNTER — Encounter: Payer: Self-pay | Admitting: Pulmonary Disease

## 2011-10-21 ENCOUNTER — Other Ambulatory Visit (INDEPENDENT_AMBULATORY_CARE_PROVIDER_SITE_OTHER): Payer: Federal, State, Local not specified - PPO

## 2011-10-21 DIAGNOSIS — Z0000 Encounter for general adult medical examination without abnormal findings: Secondary | ICD-10-CM

## 2011-10-21 LAB — BASIC METABOLIC PANEL
BUN: 16 mg/dL (ref 6–23)
Calcium: 9 mg/dL (ref 8.4–10.5)
Creatinine, Ser: 0.8 mg/dL (ref 0.4–1.2)
GFR: 101.85 mL/min (ref >60.00)
Glucose, Bld: 68 mg/dL — ABNORMAL LOW (ref 70–99)
Sodium: 140 mEq/L (ref 135–145)

## 2011-10-21 LAB — LIPID PANEL
HDL: 91.7 mg/dL (ref >39.00)
Triglycerides: 27 mg/dL (ref 0.0–149.0)

## 2011-10-26 ENCOUNTER — Other Ambulatory Visit: Payer: Self-pay | Admitting: *Deleted

## 2011-10-26 MED ORDER — POTASSIUM CHLORIDE CRYS ER 20 MEQ PO TBCR: 20.0000 meq | EXTENDED_RELEASE_TABLET | Freq: Two times a day (BID) | ORAL | Status: DC

## 2012-07-13 ENCOUNTER — Ambulatory Visit (INDEPENDENT_AMBULATORY_CARE_PROVIDER_SITE_OTHER): Payer: Federal, State, Local not specified - PPO

## 2012-07-13 DIAGNOSIS — Z23 Encounter for immunization: Secondary | ICD-10-CM

## 2012-07-19 DIAGNOSIS — Z23 Encounter for immunization: Secondary | ICD-10-CM

## 2012-10-05 ENCOUNTER — Encounter: Payer: Self-pay | Admitting: Pulmonary Disease

## 2012-10-15 ENCOUNTER — Other Ambulatory Visit: Payer: Self-pay | Admitting: Pulmonary Disease

## 2012-12-14 ENCOUNTER — Other Ambulatory Visit: Payer: Self-pay | Admitting: Pulmonary Disease

## 2013-01-01 ENCOUNTER — Encounter: Payer: Self-pay | Admitting: Pulmonary Disease

## 2013-01-01 ENCOUNTER — Other Ambulatory Visit (INDEPENDENT_AMBULATORY_CARE_PROVIDER_SITE_OTHER): Payer: Federal, State, Local not specified - PPO

## 2013-01-01 ENCOUNTER — Ambulatory Visit (INDEPENDENT_AMBULATORY_CARE_PROVIDER_SITE_OTHER): Payer: Federal, State, Local not specified - PPO | Admitting: Pulmonary Disease

## 2013-01-01 VITALS — BP 138/78 | HR 70 | Temp 97.5°F | Ht 64.0 in | Wt 131.0 lb

## 2013-01-01 DIAGNOSIS — E559 Vitamin D deficiency, unspecified: Secondary | ICD-10-CM

## 2013-01-01 DIAGNOSIS — I8390 Asymptomatic varicose veins of unspecified lower extremity: Secondary | ICD-10-CM

## 2013-01-01 DIAGNOSIS — E7800 Pure hypercholesterolemia, unspecified: Secondary | ICD-10-CM

## 2013-01-01 DIAGNOSIS — D126 Benign neoplasm of colon, unspecified: Secondary | ICD-10-CM

## 2013-01-01 DIAGNOSIS — I10 Essential (primary) hypertension: Secondary | ICD-10-CM

## 2013-01-01 DIAGNOSIS — F411 Generalized anxiety disorder: Secondary | ICD-10-CM

## 2013-01-01 DIAGNOSIS — M25579 Pain in unspecified ankle and joints of unspecified foot: Secondary | ICD-10-CM

## 2013-01-01 DIAGNOSIS — K219 Gastro-esophageal reflux disease without esophagitis: Secondary | ICD-10-CM

## 2013-01-01 LAB — CBC WITH DIFFERENTIAL/PLATELET
Basophils Absolute: 0 10*3/uL (ref 0.0–0.1)
Basophils Relative: 0.5 % (ref 0.0–3.0)
Eosinophils Absolute: 0.1 10*3/uL (ref 0.0–0.7)
Lymphocytes Relative: 39.9 % (ref 12.0–46.0)
MCHC: 33.5 g/dL (ref 30.0–36.0)
Neutrophils Relative %: 46.9 % (ref 43.0–77.0)
RBC: 4.5 Mil/uL (ref 3.87–5.11)

## 2013-01-01 LAB — BASIC METABOLIC PANEL
CO2: 30 mEq/L (ref 19–32)
Calcium: 9.8 mg/dL (ref 8.4–10.5)
Creatinine, Ser: 0.9 mg/dL (ref 0.4–1.2)
GFR: 86.62 mL/min (ref >60.00)
Glucose, Bld: 93 mg/dL (ref 70–99)
Sodium: 140 mEq/L (ref 135–145)

## 2013-01-01 LAB — HEPATIC FUNCTION PANEL
ALT: 21 U/L (ref 0–35)
Bilirubin, Direct: 0 mg/dL (ref 0.0–0.3)
Total Protein: 8 g/dL (ref 6.0–8.3)

## 2013-01-01 LAB — TSH: TSH: 1.9 u[IU]/mL (ref 0.35–5.50)

## 2013-01-01 MED ORDER — POTASSIUM CHLORIDE CRYS ER 20 MEQ PO TBCR: 20.0000 meq | EXTENDED_RELEASE_TABLET | Freq: Every day | ORAL | Status: DC

## 2013-01-01 MED ORDER — HYDROCHLOROTHIAZIDE 12.5 MG PO CAPS: 12.5000 mg | ORAL_CAPSULE | Freq: Every day | ORAL | Status: DC

## 2013-01-01 MED ORDER — IRBESARTAN 150 MG PO TABS: 150.0000 mg | ORAL_TABLET | Freq: Every day | ORAL | Status: DC

## 2013-01-01 NOTE — Patient Instructions (Addendum)
Today we updated your med list in our EPIC system...    Continue your current medications the same...    We refilled your meds per request...  Today we did your non-fasting blood work...    We will contact you w/ the results when available...   Call for any questions...  Let's plan a follow up visit in 65yr, sooner if needed for problems.Marland KitchenMarland Kitchen

## 2013-01-02 LAB — VITAMIN D 25 HYDROXY (VIT D DEFICIENCY, FRACTURES): Vit D, 25-Hydroxy: 32 ng/mL (ref 30–89)

## 2013-01-07 ENCOUNTER — Telehealth: Payer: Self-pay | Admitting: Pulmonary Disease

## 2013-01-07 NOTE — Telephone Encounter (Signed)
Called and spoke with pt and she is aware of lab results per SN.  pts med list has been updated with the vit d 1000 daily. Nothing further is needed.

## 2013-02-13 ENCOUNTER — Encounter: Payer: Self-pay | Admitting: Pulmonary Disease

## 2013-02-13 NOTE — Progress Notes (Signed)
Subjective:     Patient ID: [name] [name], female   DOB: [birth year], 62 y.o.   MRN: [id number]  HPI 62 y/o BF here for a follow up visit... she has mult med problems as noted....   ~  July 14, 2009:  she has been under some stress caring for her mother, [name] [name]... she is c/o being tired- not weak, exercises daily by walking her dog (husky)... BP well controlled on the Avalide & overdue for FLP to check her cholesterol... OK flu shot today.  ~  July 14, 2010:  she has had a good yr- no new complaints or concerns, just incr stress w/ mother... her insurance comp changed her Avalide to Avapro + HCTZ for some reason- BP remains well controlled;  she has hx sl elev TChol/ LDL but good HDL & she doesn't want meds Rx (committed to diet alone);  weight= 126# is down 9# in the last yr;  GI is stable & she is due for 18yr f/u colon- refer to DrPatterson;  she is on Premarin, black cohosh, calcium, vits per GYN... OK 2011 Flu vaccine & TDAP booster today...  ~  September 27, 2011:  Yearly ROV & CPX> [name] [name] reports a good yr w/o new complaints or concerns & she has a new Huskie dog "Ridley";  She saw TP 9/12 w/ fatigue & labs showed K=3.0 (due to HCTZ), supplemented & feels better; we discussed checking CXR (she didn't go to basement for this film), EKG (NSR, rate60, WNL), & ret for fasting labs (she never did- we will call w/ reminder)... See Prob List>>  ~  January 01, 2013:  55mo ROV & CPX> & [name] [name] is stable, good interval w/o new complaints or concerns- just painful left bubion & we discussed referral to Podiatry when she is ready... We reviewed the following medical problems during today's office visit >>     HBP> on ASA81, Avapro150, HCT12.5, K20; BP= 138/78 & she denies CP, palpit, dizzy, SOB, edema, etc...    VV> she knows to elim sodium, elev legs, wear support hose...    CHOL> on diet alone; last FLP 2/13 showed TChol 205, TG 27, HDL 92, LDL 98; she is not fasting for blood work today...     GI- GERD, Polyps> on OTC Prilosec vs Zantac prn; f/u colon 11/12 by DrPatterson was neg & he rec f/u 15yrs...    GYN> on Premarin0.3 and BlackCohosh per [facility]; she is s/p hyst...    Anxiety> on Ativan 0.5mg - 1/2 to 1 Tid prn; stress w/ care of motherLorenza [name]...    Hx idiopathic urticaria> she believed this was due to wt lifting in the past; no recurrence... We reviewed prob list, meds, xrays and labs> see below for updates >> she had the 2013 Flu vaccine 10/13... LABS 4/14:  Chems- wnl w/ K=3.6;  CBC- wnl;  TSH=1.90;  VitD=32 & rec to take 1000u OTC supplement daily...           Problem List:  HYPERTENSION (ICD-401.9) - on AVAPRO 150mg /d + HCTZ 12.5mg /d & K20 added 9/12 (taking 1/2 tab daily she says) w/ good control...  ~  10/11:  BP= 126/78, tolerating rx well... denies HA, fatigue, visual changes, CP, palipit, dizziness, syncope, dyspnea, edema, etc... ~  1/13:  BP= 132/80 & her fatigue is resolved, feeling better, currently asymptomatic... ~  4/14: on ASA81, Avapro150, HCT12.5, K20; BP= 138/78 & she denies CP, palpit, dizzy, SOB, edema, etc.  VARICOSE VEINS, LOWER EXTREMITIES (  ICD-454.9) - she has been eval at the Vein clinic and had VV surg by DrFeatherston (sclerotherapy w/o help she says)... she avoids sodium, elevates legs, wears support hose...  HYPERCHOLESTEROLEMIA (ICD-272.0) - on diet alone and has a high HDL... ~  last FLP was 6/05 showing TChol 236, Tg 148, HDL 83, LDL 125... I told her she may need a low dose statin med to get her TChol & LDL to goal... ~  FLP 10/10 showed TChol 222, TG 63, HDL 92, LDL 118... rec better diet effort. ~  FLP 1/13 on diet alone showed  TChol 205, TG 27, HDL 92, LDL 98  GERD (ICD-530.81) - occas reflux symptoms treated w/ Prilosec vs Zantac OTC...  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 follow up planned 46yrs & we will refer  to GI for this important screening procedure. ~  F/u colonoscopy done 11/12 by DrPatterson was neg> no polyps or cancers, no rectal mass, f/u suggested in 29yrs...  HEADACHE (ICD-784.0) - she believes related to stress...  ANXIETY (ICD-300.00) - she uses LORAZEPAM 0.5mg  as needed for anxiety... eg. during storms etc...  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  Health Maintenance:  Also takes ASA 81mg /d... GYN= DrHorvath, on Premarin 0.625mg /d, s/p hysterectomy... Mammograms yearly at [facility] (she has implants)- and they follow closely... she gets the seasonal Flu vaccine yearly... given TDAP booster 10/11...   Past Surgical History  Procedure Laterality Date  . Vaginal hysterectomy    . Bilateral breast implants    . Benign breast biopsy    . Varicose vein    . Cataract right eye w/ lens implant    . Excision of large anal polyp    . Colonoscopy    . Polypectomy      Outpatient Encounter Prescriptions as of 01/01/2013  Medication Sig Dispense Refill  . aspirin 81 MG tablet Take 81 mg by mouth daily.        Marland Kitchen estrogens, conjugated, (PREMARIN) 0.3 MG tablet Take 0.3 mg by mouth daily. Take daily for 21 days then do not take for 7 days.       . hydrochlorothiazide (MICROZIDE) 12.5 MG capsule Take 1 capsule (12.5 mg total) by mouth daily.  30 capsule  11  . irbesartan (AVAPRO) 150 MG tablet Take 1 tablet (150 mg total) by mouth daily.  30 tablet  11  . LORazepam (ATIVAN) 0.5 MG tablet Take 1/2 to 1 tablet by mouth up to 3 times a day as needed for nerves  90 tablet  5  . potassium chloride SA (K-DUR,KLOR-CON) 20 MEQ tablet Take 1 tablet (20 mEq total) by mouth daily.  30 tablet  11  . [DISCONTINUED] hydrochlorothiazide (MICROZIDE) 12.5 MG capsule TAKE ONE CAPSULE BY MOUTH EVERY MORNING **NEEDS OV**  30 capsule  1  . [DISCONTINUED] irbesartan (AVAPRO) 150 MG tablet TAKE 1 TABLET BY MOUTH EVERY DAY **NEEDS OV**  30 tablet   1  . [DISCONTINUED] potassium chloride SA (K-DUR,KLOR-CON) 20 MEQ tablet Take 1 tablet (20 mEq total) by mouth 2 (two) times daily.  180 tablet  3  . [DISCONTINUED] potassium chloride SA (K-DUR,KLOR-CON) 20 MEQ tablet Take 20 mEq by mouth daily.      . [DISCONTINUED] Black Cohosh 200 MG CAPS Take 1 capsule by mouth daily.         No  facility-administered encounter medications on file as of 01/01/2013.    No Known Allergies   Current Medications, Allergies, Past Medical History, Past Surgical History, Family History, and Social History were reviewed in [employer] record.    Review of Systems       See HPI - all other systems neg except as noted...  The patient denies anorexia, fever, weight loss, weight gain, vision loss, decreased hearing, hoarseness, chest pain, syncope, dyspnea on exertion, peripheral edema, prolonged cough, headaches, hemoptysis, abdominal pain, melena, hematochezia, severe indigestion/heartburn, hematuria, incontinence, muscle weakness, suspicious skin lesions, transient blindness, difficulty walking, depression, unusual weight change, abnormal bleeding, enlarged lymph nodes, and angioedema.    Objective:   Physical Exam     WD, WN, 61 y/o BF in NAD... GENERAL:  Alert & oriented; pleasant & cooperative... HEENT:  [place]/AT, EOM-wnl, PERRLA, EACs-clear, TMs-wnl, NOSE-clear, THROAT-clear & wnl. NECK:  Supple w/ full ROM; no JVD; normal carotid impulses w/o bruits; no thyromegaly or nodules palpated; no lymphadenopathy. CHEST:  Clear to P & A; without wheezes/ rales/ or rhonchi. HEART:  Regular Rhythm; without murmurs/ rubs/ or gallops. ABDOMEN:  Soft & nontender; normal bowel sounds; no organomegaly or masses detected. EXT: without deformities or arthritic changes; no varicose veins/ venous insuffic/ or edema. NEURO:  CN's intact; motor testing normal; sensory testing normal; gait normal & balance OK. DERM:  No lesions noted; no rash  etc...  RADIOLOGY DATA:  Reviewed in the EPIC EMR & discussed w/ the patient...  LABORATORY DATA:  Reviewed in the EPIC EMR & discussed w/ the patient...   Assessment:     CPX>>  HBP>  Controlled on Avapro & HCT w/ K20 added 9/12 due to fatigue & hypokalemia; continue same,  f/u labs=> ok...  VV, VI>  Prev eval at the Vein clinic w/ surg by DrFeatherston...  CHOL>  On diet alone & FLP looked ok in 2013...  GI> GERD, Polyps>  She uses OTC acid suppressors & had f/u co9lonoscopy 11/12 (neg)...  Hx HAs>  She denies recent HAs etc...  Anxiety>  She uses Lorazepam as needed & requests refill of this med...     Plan:     Patient's Medications  New Prescriptions   No medications on file  Previous Medications   ASPIRIN 81 MG TABLET    Take 81 mg by mouth daily.     CHOLECALCIFEROL (VITAMIN D) 1000 UNITS TABLET    Take 1,000 Units by mouth daily.   ESTROGENS, CONJUGATED, (PREMARIN) 0.3 MG TABLET    Take 0.3 mg by mouth daily. Take daily for 21 days then do not take for 7 days.    LORAZEPAM (ATIVAN) 0.5 MG TABLET    Take 1/2 to 1 tablet by mouth up to 3 times a day as needed for nerves  Modified Medications   Modified Medication Previous Medication   HYDROCHLOROTHIAZIDE (MICROZIDE) 12.5 MG CAPSULE hydrochlorothiazide (MICROZIDE) 12.5 MG capsule      Take 1 capsule (12.5 mg total) by mouth daily.    TAKE ONE CAPSULE BY MOUTH EVERY MORNING **NEEDS OV**   IRBESARTAN (AVAPRO) 150 MG TABLET irbesartan (AVAPRO) 150 MG tablet      Take 1 tablet (150 mg total) by mouth daily.    TAKE 1 TABLET BY MOUTH EVERY DAY **NEEDS OV**   POTASSIUM CHLORIDE SA (K-DUR,KLOR-CON) 20 MEQ TABLET potassium chloride SA (K-DUR,KLOR-CON) 20 MEQ tablet      Take 1 tablet (20 mEq total) by mouth daily.  Take 1 tablet (20 mEq total) by mouth 2 (two) times daily.  Discontinued Medications   BLACK COHOSH 200 MG CAPS    Take 1 capsule by mouth daily.     POTASSIUM CHLORIDE SA (K-DUR,KLOR-CON) 20 MEQ TABLET    Take  20 mEq by mouth daily.

## 2013-08-13 ENCOUNTER — Ambulatory Visit (INDEPENDENT_AMBULATORY_CARE_PROVIDER_SITE_OTHER): Payer: Federal, State, Local not specified - PPO

## 2013-08-13 DIAGNOSIS — Z23 Encounter for immunization: Secondary | ICD-10-CM

## 2014-01-18 ENCOUNTER — Other Ambulatory Visit: Payer: Self-pay | Admitting: Pulmonary Disease

## 2014-03-19 ENCOUNTER — Encounter: Payer: Self-pay | Admitting: Pulmonary Disease

## 2014-07-16 ENCOUNTER — Telehealth: Payer: Self-pay | Admitting: Pulmonary Disease

## 2014-07-16 NOTE — Telephone Encounter (Signed)
lmtcb x1 

## 2014-07-16 NOTE — Telephone Encounter (Signed)
[phone] caalling back

## 2014-07-16 NOTE — Telephone Encounter (Signed)
Ok to schedule appt for the pt.  I attempted to call the pt to set this up but it stated that all circuits are busy.  Try again later.  Will call back later.

## 2014-07-16 NOTE — Telephone Encounter (Signed)
Please advise.  Bing, CMA

## 2014-07-18 NOTE — Telephone Encounter (Signed)
lmomtcb x1 

## 2014-07-18 NOTE — Telephone Encounter (Signed)
appt has been scheduled for the pt. Nothing further is needed.

## 2014-07-28 ENCOUNTER — Ambulatory Visit (INDEPENDENT_AMBULATORY_CARE_PROVIDER_SITE_OTHER): Payer: Federal, State, Local not specified - PPO | Admitting: Pulmonary Disease

## 2014-07-28 ENCOUNTER — Encounter: Payer: Self-pay | Admitting: Pulmonary Disease

## 2014-07-28 ENCOUNTER — Ambulatory Visit (INDEPENDENT_AMBULATORY_CARE_PROVIDER_SITE_OTHER)
Admission: RE | Admit: 2014-07-28 | Discharge: 2014-07-28 | Disposition: A | Discharge status: Home / Self Care | Payer: Federal, State, Local not specified - PPO | Source: Ambulatory Visit | Attending: Pulmonary Disease | Admitting: Pulmonary Disease

## 2014-07-28 VITALS — BP 180/90 | HR 86 | Temp 98.1°F | Ht 64.0 in | Wt 132.0 lb

## 2014-07-28 DIAGNOSIS — I8393 Asymptomatic varicose veins of bilateral lower extremities: Secondary | ICD-10-CM

## 2014-07-28 DIAGNOSIS — D126 Benign neoplasm of colon, unspecified: Secondary | ICD-10-CM

## 2014-07-28 DIAGNOSIS — I10 Essential (primary) hypertension: Secondary | ICD-10-CM

## 2014-07-28 DIAGNOSIS — K219 Gastro-esophageal reflux disease without esophagitis: Secondary | ICD-10-CM

## 2014-07-28 DIAGNOSIS — E7800 Pure hypercholesterolemia, unspecified: Secondary | ICD-10-CM

## 2014-07-28 DIAGNOSIS — F411 Generalized anxiety disorder: Secondary | ICD-10-CM

## 2014-07-28 MED ORDER — LOSARTAN POTASSIUM 100 MG PO TABS: 100.0000 mg | ORAL_TABLET | Freq: Every day | ORAL | Status: DC

## 2014-07-28 MED ORDER — LORAZEPAM 0.5 MG PO TABS: ORAL_TABLET | ORAL | Status: DC

## 2014-07-28 NOTE — Patient Instructions (Signed)
Today we updated your med list in our EPIC system...    Remember- if you ever run out of meds or have any questions- call us at [phone]...  We decided to change your previous med regimen from Avapro, HCT, KCl to just LOSARTAN 100mg  - take one tab daily...    Remember to eliminate sodium/ salt from your diet...  Today we did a follow up EKG & CXR...    We will contact you w/ the results when available...   We gave you the 2015 Flu vaccine today...  Call for any questions...  Let's plan a follow up visit in 2 months for f/u BP check AND FASTING blood work on that day.Marland KitchenMarland Kitchen

## 2014-07-28 NOTE — Progress Notes (Signed)
Subjective:     Patient ID: [name] [name], female   DOB: 1951/02/13, 63 y.o.   MRN: [id number]  HPI 63 y/o BF here for a follow up visit... she has mult med problems as noted....  ~  SEE PREV EPIC NOTES FOR OLDER DATA >>   ~  July 14, 2010:  she has had a good yr- no new complaints or concerns, just incr stress w/ mother... her insurance comp changed her Avalide to Avapro + HCTZ for some reason- BP remains well controlled;  she has hx sl elev TChol/ LDL but good HDL & she doesn't want meds Rx (committed to diet alone);  weight= 126# is down 9# in the last yr;  GI is stable & she is due for 21yr f/u colon- refer to DrPatterson;  she is on Premarin, black cohosh, calcium, vits per GYN... OK 2011 Flu vaccine & TDAP booster today...  ~  September 27, 2011:  Yearly ROV & CPX> [name] [name] reports a good yr w/o new complaints or concerns & she has a new Huskie dog "[name] [name]";  She saw TP 9/12 w/ fatigue & labs showed K=3.0 (due to HCTZ), supplemented & feels better; we discussed checking CXR (she didn't go to basement for this film), EKG (NSR, rate60, WNL), & ret for fasting labs (she never did- we will call w/ reminder)... See Prob List>>  ~  January 01, 2013:  74mo ROV & CPX> & [name] [name] is stable, good interval w/o new complaints or concerns- just painful left bubion & we discussed referral to Podiatry when she is ready... We reviewed the following medical problems during today's office visit >>     HBP> on ASA81, Avapro150, HCT12.5, K20; BP= 138/78 & she denies CP, palpit, dizzy, SOB, edema, etc...    VV> she knows to elim sodium, elev legs, wear support hose...    CHOL> on diet alone; last FLP 2/13 showed TChol 205, TG 27, HDL 92, LDL 98; she is not fasting for blood work today...    GI- GERD, Polyps> on OTC Prilosec vs Zantac prn; f/u colon 11/12 by DrPatterson was neg & he rec f/u 1yrs...    GYN> on Premarin0.3 and BlackCohosh per [facility]; she is s/p hyst...    Anxiety> on Ativan 0.5mg - 1/2 to 1 Tid prn;  stress w/ care of motherLorenza [name]...    Hx idiopathic urticaria> she believed this was due to wt lifting in the past; no recurrence... We reviewed prob list, meds, xrays and labs> see below for updates >> she had the 2013 Flu vaccine 10/13... LABS 4/14:  Chems- wnl w/ K=3.6;  CBC- wnl;  TSH=1.90;  VitD=32 & rec to take 1000u OTC supplement daily...  ~  July 28, 2014:  96mo ROV & review of medical problems> [name] [name] states that she has been doing fine- feels well & asymptomatic but she ran out of her BP meds ~2mo ago & never called for a refill; now she notes that home BP check are running in the 170/90 range and BP confirmed 180/90 today; we discussed need to call the office anytime BP is elev, she has questions about meds/ what to do/ refills/ etc... We reviewed the following medical problems during today's office visit >>     HBP> on ASA81 & ran out of Avapro150/ HCT12.5/ K20- all ~93mo ago; BP= 180/90 & she denies CP, palpit, dizzy, SOB, edema, etc; we decided to change to LOSARTAN100mg /d, low sodium, etc...    VV> she knows to  elim sodium, elev legs, wear support hose; denies pain, swelling, leg lesions...    CHOL> on diet alone; last FLP 2/13 showed TChol 205, TG 27, HDL 92, LDL 98; she is not fasting for blood work today...    GI- GERD, Polyps> on OTC Prilosec vs Zantac prn; f/u colon 11/12 by DrPatterson was neg & he rec f/u 47yrs...    GYN> on Premarin0.3 and BlackCohosh per Andree Coss (seen yearly); she is s/p hyst; mammograms neg at [facility] annually (last 6/15)...    Anxiety> on Ativan 0.5mg - 1/2 to 1 Tid prn; stress w/ care of motherLorenza [name]; med refilled...    Hx idiopathic urticaria> she believed this was due to wt lifting in the past; no recurrence... We reviewed prob list, meds, xrays and labs> see below for updates >> OK 2015 Flu vaccine today, she will need Prevnar-13 in follow up...  CXR 11/15 showed norm heart size, clear lungs, NAD.Marland KitchenMarland Kitchen  EKG 11/15 showed NSR,  rate71, wnl, NAD... PLAN>>  She is not fasting today for blood work; we reviewed the importance of staying on meds & the need to call us for any questions (or refills if Pharm won't co-operate); REC changing to LOSARTAN100mg /d, no salt, monitor BP at home & call for questions; we plan ROV 51mo w/ FASTING labs and BP check...            Problem List:  HYPERTENSION (ICD-401.9) -  ~  Prev on AVAPRO 150mg /d + HCTZ 12.5mg /d & K20 added 9/12 (taking 1/2 tab daily she says) w/ good control...  ~  10/11:  BP= 126/78, tolerating rx well... denies HA, fatigue, visual changes, CP, palipit, dizziness, syncope, dyspnea, edema, etc... ~  1/13:  BP= 132/80 & her fatigue is resolved, feeling better, currently asymptomatic... ~  4/14: on ASA81, Avapro150, HCT12.5, K20; BP= 138/78 & she denies CP, palpit, dizzy, SOB, edema, etc. ~  11/15: on ASA81 & ran out of Avapro150/ HCT12.5/ K20- all ~8mo ago; BP= 180/90 & she denies CP, palpit, dizzy, SOB, edema, etc; we decided to change to LOSARTAN100mg /d, low sodium, etc.  VARICOSE VEINS, LOWER EXTREMITIES (ICD-454.9) - she has been eval at the Vein clinic and had VV surg by DrFeatherston (sclerotherapy w/o help she says)... she avoids sodium, elevates legs, wears support hose...  HYPERCHOLESTEROLEMIA (ICD-272.0) - on diet alone and has a high HDL... ~  last FLP was 6/05 showing TChol 236, Tg 148, HDL 83, LDL 125... I told her she may need a low dose statin med to get her TChol & LDL to goal... ~  FLP 10/10 showed TChol 222, TG 63, HDL 92, LDL 118... rec better diet effort. ~  FLP 1/13 on diet alone showed  TChol 205, TG 27, HDL 92, LDL 98  GERD (ICD-530.81) - occas reflux symptoms treated w/ Prilosec vs Zantac OTC...  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 follow up planned 57yrs & we will refer to GI for this important screening procedure. ~  F/u colonoscopy done  11/12 by DrPatterson was neg> no polyps or cancers, no rectal mass, f/u suggested in 10yrs...  HEADACHE (ICD-784.0) - she believes related to stress...  ANXIETY (ICD-300.00) - she uses LORAZEPAM 0.5mg  as needed for anxiety... eg. during storms etc...  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  Health Maintenance:  Also takes ASA 81mg /d... GYN=  DrHorvath, on Premarin 0.3mg /d, s/p hysterectomy... Mammograms yearly at [facility] (she has implants)- and they follow closely... she gets the seasonal Flu vaccine yearly... given TDAP booster 10/11...   Past Surgical History  Procedure Laterality Date  . Vaginal hysterectomy    . Bilateral breast implants    . Benign breast biopsy    . Varicose vein    . Cataract right eye w/ lens implant    . Excision of large anal polyp    . Colonoscopy    . Polypectomy      Outpatient Encounter Prescriptions as of 07/28/2014  Medication Sig  . aspirin 81 MG tablet Take 81 mg by mouth daily.    Marland Kitchen estrogens, conjugated, (PREMARIN) 0.3 MG tablet Take 0.3 mg by mouth daily. Take daily for 21 days then do not take for 7 days.   Marland Kitchen GARCINIA CAMBOGIA-CHROMIUM PO Take 2 tablets by mouth 2 (two) times daily.  Marland Kitchen LORazepam (ATIVAN) 0.5 MG tablet Take 1/2 to 1 tablet by mouth up to 3 times a day as needed for nerves  . [DISCONTINUED] LORazepam (ATIVAN) 0.5 MG tablet Take 1/2 to 1 tablet by mouth up to 3 times a day as needed for nerves  . losartan (COZAAR) 100 MG tablet Take 1 tablet (100 mg total) by mouth daily.  . [DISCONTINUED] cholecalciferol (VITAMIN D) 1000 UNITS tablet Take 1,000 Units by mouth daily.  . [DISCONTINUED] hydrochlorothiazide (MICROZIDE) 12.5 MG capsule Take 1 capsule (12.5 mg total) by mouth daily.  . [DISCONTINUED] irbesartan (AVAPRO) 150 MG tablet Take 1 tablet (150 mg total) by mouth daily.  . [DISCONTINUED] potassium chloride SA (K-DUR,KLOR-CON) 20 MEQ tablet Take 1 tablet (20 mEq  total) by mouth daily.    No Known Allergies   Current Medications, Allergies, Past Medical History, Past Surgical History, Family History, and Social History were reviewed in [employer] record.    Review of Systems       See HPI - all other systems neg except as noted...  The patient denies anorexia, fever, weight loss, weight gain, vision loss, decreased hearing, hoarseness, chest pain, syncope, dyspnea on exertion, peripheral edema, prolonged cough, headaches, hemoptysis, abdominal pain, melena, hematochezia, severe indigestion/heartburn, hematuria, incontinence, muscle weakness, suspicious skin lesions, transient blindness, difficulty walking, depression, unusual weight change, abnormal bleeding, enlarged lymph nodes, and angioedema.    Objective:   Physical Exam     WD, WN, 63 y/o BF in NAD... GENERAL:  Alert & oriented; pleasant & cooperative... HEENT:  [place]/AT, EOM-wnl, PERRLA, EACs-clear, TMs-wnl, NOSE-clear, THROAT-clear & wnl. NECK:  Supple w/ full ROM; no JVD; normal carotid impulses w/o bruits; no thyromegaly or nodules palpated; no lymphadenopathy. CHEST:  Clear to P & A; without wheezes/ rales/ or rhonchi. HEART:  Regular Rhythm; without murmurs/ rubs/ or gallops. ABDOMEN:  Soft & nontender; normal bowel sounds; no organomegaly or masses detected. EXT: without deformities or arthritic changes; no varicose veins/ venous insuffic/ or edema. NEURO:  CN's intact; motor testing normal; sensory testing normal; gait normal & balance OK. DERM:  No lesions noted; no rash etc...  RADIOLOGY DATA:  Reviewed in the EPIC EMR & discussed w/ the patient...  LABORATORY DATA:  Reviewed in the EPIC EMR & discussed w/ the patient...   Assessment:      HBP> prev controlled on Avapro & HCT w/ K20 added 9/12 due to fatigue & hypokalemia; she ran out of meds 4/15 & never called for refills or f/u appt til now; BP is elev 180/90 &  we decided to start LOSARTAN100, low  sodium diet, monitor BP at home & call for questions; plan short term ROV in 27mo to check BP & FASTING labs...  VV, VI>  Prev eval at the Vein clinic w/ surg by DrFeatherston...  CHOL>  On diet alone & FLP looked ok in 2013...  GI> GERD, Polyps>  She uses OTC acid suppressors & had f/u colonoscopy 11/12 (neg)...  Hx HAs>  She denies recent HAs etc...  Anxiety>  She uses Lorazepam as needed & requests refill of this med...     Plan:     Patient's Medications  New Prescriptions   LOSARTAN (COZAAR) 100 MG TABLET    Take 1 tablet (100 mg total) by mouth daily.  Previous Medications   ASPIRIN 81 MG TABLET    Take 81 mg by mouth daily.     ESTROGENS, CONJUGATED, (PREMARIN) 0.3 MG TABLET    Take 0.3 mg by mouth daily. Take daily for 21 days then do not take for 7 days.    GARCINIA CAMBOGIA-CHROMIUM PO    Take 2 tablets by mouth 2 (two) times daily.  Modified Medications   Modified Medication Previous Medication   LORAZEPAM (ATIVAN) 0.5 MG TABLET LORazepam (ATIVAN) 0.5 MG tablet      Take 1/2 to 1 tablet by mouth up to 3 times a day as needed for nerves    Take 1/2 to 1 tablet by mouth up to 3 times a day as needed for nerves  Discontinued Medications   CHOLECALCIFEROL (VITAMIN D) 1000 UNITS TABLET    Take 1,000 Units by mouth daily.   HYDROCHLOROTHIAZIDE (MICROZIDE) 12.5 MG CAPSULE    Take 1 capsule (12.5 mg total) by mouth daily.   IRBESARTAN (AVAPRO) 150 MG TABLET    Take 1 tablet (150 mg total) by mouth daily.   POTASSIUM CHLORIDE SA (K-DUR,KLOR-CON) 20 MEQ TABLET    Take 1 tablet (20 mEq total) by mouth daily.

## 2014-08-05 ENCOUNTER — Other Ambulatory Visit: Payer: Self-pay | Admitting: Pulmonary Disease

## 2014-08-05 DIAGNOSIS — E559 Vitamin D deficiency, unspecified: Secondary | ICD-10-CM

## 2014-08-05 DIAGNOSIS — I10 Essential (primary) hypertension: Secondary | ICD-10-CM

## 2014-08-05 DIAGNOSIS — F411 Generalized anxiety disorder: Secondary | ICD-10-CM

## 2014-08-05 DIAGNOSIS — D126 Benign neoplasm of colon, unspecified: Secondary | ICD-10-CM

## 2014-08-05 DIAGNOSIS — E7800 Pure hypercholesterolemia, unspecified: Secondary | ICD-10-CM

## 2014-09-29 ENCOUNTER — Encounter: Payer: Self-pay | Admitting: Pulmonary Disease

## 2014-09-29 ENCOUNTER — Other Ambulatory Visit (INDEPENDENT_AMBULATORY_CARE_PROVIDER_SITE_OTHER): Payer: Federal, State, Local not specified - PPO

## 2014-09-29 ENCOUNTER — Ambulatory Visit (INDEPENDENT_AMBULATORY_CARE_PROVIDER_SITE_OTHER): Payer: Federal, State, Local not specified - PPO | Admitting: Pulmonary Disease

## 2014-09-29 VITALS — BP 142/90 | HR 66 | Temp 97.0°F | Ht 62.0 in | Wt 125.6 lb

## 2014-09-29 DIAGNOSIS — D126 Benign neoplasm of colon, unspecified: Secondary | ICD-10-CM

## 2014-09-29 DIAGNOSIS — E7800 Pure hypercholesterolemia, unspecified: Secondary | ICD-10-CM

## 2014-09-29 DIAGNOSIS — I10 Essential (primary) hypertension: Secondary | ICD-10-CM

## 2014-09-29 DIAGNOSIS — E559 Vitamin D deficiency, unspecified: Secondary | ICD-10-CM

## 2014-09-29 DIAGNOSIS — F411 Generalized anxiety disorder: Secondary | ICD-10-CM

## 2014-09-29 DIAGNOSIS — K219 Gastro-esophageal reflux disease without esophagitis: Secondary | ICD-10-CM

## 2014-09-29 LAB — TSH: TSH: 1.78 u[IU]/mL (ref 0.35–4.50)

## 2014-09-29 LAB — CBC WITH DIFFERENTIAL/PLATELET
BASOS PCT: 0.5 % (ref 0.0–3.0)
Basophils Absolute: 0 10*3/uL (ref 0.0–0.1)
EOS ABS: 0 10*3/uL (ref 0.0–0.7)
Eosinophils Relative: 0.7 % (ref 0.0–5.0)
HCT: 43 % (ref 36.0–46.0)
HEMOGLOBIN: 14.1 g/dL (ref 12.0–15.0)
LYMPHS PCT: 24.3 % (ref 12.0–46.0)
Lymphs Abs: 1.6 10*3/uL (ref 0.7–4.0)
MCHC: 32.8 g/dL (ref 30.0–36.0)
MCV: 87.8 fl (ref 78.0–100.0)
MONO ABS: 0.5 10*3/uL (ref 0.1–1.0)
Monocytes Relative: 7.3 % (ref 3.0–12.0)
NEUTROS ABS: 4.3 10*3/uL (ref 1.4–7.7)
Neutrophils Relative %: 67.2 % (ref 43.0–77.0)
Platelets: 365 10*3/uL (ref 150.0–400.0)
RBC: 4.9 Mil/uL (ref 3.87–5.11)
RDW: 14 % (ref 11.5–15.5)
WBC: 6.4 10*3/uL (ref 4.0–10.5)

## 2014-09-29 LAB — VITAMIN D 25 HYDROXY (VIT D DEFICIENCY, FRACTURES): VITD: 16.89 ng/mL — AB (ref 30.00–100.00)

## 2014-09-29 LAB — HEPATIC FUNCTION PANEL
ALBUMIN: 4.1 g/dL (ref 3.5–5.2)
ALT: 20 U/L (ref 0–35)
AST: 29 U/L (ref 0–37)
Alkaline Phosphatase: 106 U/L (ref 39–117)
BILIRUBIN TOTAL: 0.6 mg/dL (ref 0.2–1.2)
Bilirubin, Direct: 0.1 mg/dL (ref 0.0–0.3)
Total Protein: 7.8 g/dL (ref 6.0–8.3)

## 2014-09-29 LAB — BASIC METABOLIC PANEL
BUN: 13 mg/dL (ref 6–23)
CALCIUM: 9.7 mg/dL (ref 8.4–10.5)
CHLORIDE: 102 meq/L (ref 96–112)
CO2: 27 meq/L (ref 19–32)
CREATININE: 0.9 mg/dL (ref 0.4–1.2)
GFR: 81.71 mL/min (ref >60.00)
Glucose, Bld: 76 mg/dL (ref 70–99)
Potassium: 3.3 mEq/L — ABNORMAL LOW (ref 3.5–5.1)
Sodium: 140 mEq/L (ref 135–145)

## 2014-09-29 NOTE — Progress Notes (Signed)
Subjective:     Patient ID: [name] [name], female   DOB: Jun 30, 1951, 64 y.o.   MRN: [id number]  HPI 64 y/o BF here for a follow up visit... she has mult med problems as noted....  ~  SEE PREV EPIC NOTES FOR OLDER DATA >>   ~  July 14, 2010:  she has had a good yr- no new complaints or concerns, just incr stress w/ mother... her insurance comp changed her Avalide to Avapro + HCTZ for some reason- BP remains well controlled;  she has hx sl elev TChol/ LDL but good HDL & she doesn't want meds Rx (committed to diet alone);  weight= 126# is down 9# in the last yr;  GI is stable & she is due for 20yr f/u colon- refer to DrPatterson;  she is on Premarin, black cohosh, calcium, vits per GYN... OK 2011 Flu vaccine & TDAP booster today...  ~  September 27, 2011:  Yearly ROV & CPX> [name] [name] reports a good yr w/o new complaints or concerns & she has a new Huskie dog "Ridley";  She saw TP 9/12 w/ fatigue & labs showed K=3.0 (due to HCTZ), supplemented & feels better; we discussed checking CXR (she didn't go to basement for this film), EKG (NSR, rate60, WNL), & ret for fasting labs (she never did- we will call w/ reminder)... See Prob List>>  ~  January 01, 2013:  25mo ROV & CPX> & [name] [name] is stable, good interval w/o new complaints or concerns- just painful left bubion & we discussed referral to Podiatry when she is ready... We reviewed the following medical problems during today's office visit >>     HBP> on ASA81, Avapro150, HCT12.5, K20; BP= 138/78 & she denies CP, palpit, dizzy, SOB, edema, etc...    VV> she knows to elim sodium, elev legs, wear support hose...    CHOL> on diet alone; last FLP 2/13 showed TChol 205, TG 27, HDL 92, LDL 98; she is not fasting for blood work today...    GI- GERD, Polyps> on OTC Prilosec vs Zantac prn; f/u colon 11/12 by DrPatterson was neg & he rec f/u 84yrs...    GYN> on Premarin0.3 and BlackCohosh per [facility]; she is s/p hyst...    Anxiety> on Ativan 0.5mg - 1/2 to 1 Tid prn;  stress w/ care of motherLorenza [name]...    Hx idiopathic urticaria> she believed this was due to wt lifting in the past; no recurrence... We reviewed prob list, meds, xrays and labs> see below for updates >> she had the 2013 Flu vaccine 10/13... LABS 4/14:  Chems- wnl w/ K=3.6;  CBC- wnl;  TSH=1.90;  VitD=32 & rec to take 1000u OTC supplement daily...  ~  July 28, 2014:  45mo ROV & review of medical problems> [name] [name] states that she has been doing fine- feels well & asymptomatic but she ran out of her BP meds ~24mo ago & never called for a refill; now she notes that home BP check are running in the 170/90 range and BP confirmed 180/90 today; we discussed need to call the office anytime BP is elev, she has questions about meds/ what to do/ refills/ etc... We reviewed the following medical problems during today's office visit >>     HBP> on ASA81 & ran out of Avapro150/ HCT12.5/ K20- all ~55mo ago; BP= 180/90 & she denies CP, palpit, dizzy, SOB, edema, etc; we decided to change to LOSARTAN100mg /d, low sodium, etc...    VV> she knows to  elim sodium, elev legs, wear support hose; denies pain, swelling, leg lesions...    CHOL> on diet alone; last FLP 2/13 showed TChol 205, TG 27, HDL 92, LDL 98; she is not fasting for blood work today...    GI- GERD, Polyps> on OTC Prilosec vs Zantac prn; f/u colon 11/12 by DrPatterson was neg & he rec f/u 91yrs...    GYN> on Premarin0.3 and BlackCohosh per Andree Coss (seen yearly); she is s/p hyst; mammograms neg at [facility] annually (last 6/15)...    Anxiety> on Ativan 0.5mg - 1/2 to 1 Tid prn; stress w/ care of motherLorenza [name]; med refilled...    Hx idiopathic urticaria> she believed this was due to wt lifting in the past; no recurrence... We reviewed prob list, meds, xrays and labs> see below for updates >> OK 2015 Flu vaccine today, she will need Prevnar-13 in follow up...  CXR 11/15 showed norm heart size, clear lungs, NAD.Marland KitchenMarland Kitchen  EKG 11/15 showed NSR,  rate71, wnl, NAD... PLAN>>  She is not fasting today for blood work; we reviewed the importance of staying on meds & the need to call us for any questions (or refills if Pharm won't co-operate); REC changing to LOSARTAN100mg /d, no salt, monitor BP at home & call for questions; we plan ROV 56mo w/ FASTING labs and BP check...   ~  September 29, 2014:  56mo ROV & [name] [name] is here for a follow up on her BP- now back on meds= Cozaar100 + low salt diet etc;  She notes that she has not yet taken today's dose & BP here is 142/90; she has not been checking her BP at home; med tolerated well & she is feeling well w/o CP/ palpit/ SOB/ edema/ etc... She did her FASTING blood work today (see below)...    CHOL> on diet alone; FLP 1/16 showed TChol 237, TG 71, HDL 78, LDL 145; this is the worst she has had & rec to start Simva40- take 1/2 tab daily...    Vit D Defic> Labs 1/16 showed Vit D level = 17 & rec to start OTC VitD supplement ~5000u daily... We reviewed prob list, meds, xrays and labs> see below for updates >>   LABS 1/16:  FLP- not at goals on diet alone w/ LDL=145;  Chems- ok x K=3.3 & rec dietary supplement;  CBC- wnl;  TSH=1.78;  VitD=17...           Problem List:  HYPERTENSION (ICD-401.9) -  ~  Prev on AVAPRO 150mg /d + HCTZ 12.5mg /d & K20 added 9/12 (taking 1/2 tab daily she says) w/ good control...  ~  10/11:  BP= 126/78, tolerating rx well... denies HA, fatigue, visual changes, CP, palipit, dizziness, syncope, dyspnea, edema, etc... ~  1/13:  BP= 132/80 & her fatigue is resolved, feeling better, currently asymptomatic... ~  4/14: on ASA81, Avapro150, HCT12.5, K20; BP= 138/78 & she denies CP, palpit, dizzy, SOB, edema, etc. ~  11/15: on ASA81 & ran out of Avapro150/ HCT12.5/ K20- all ~63mo ago; BP= 180/90 & she denies CP, palpit, dizzy, SOB, edema, etc; we decided to change to LOSARTAN100mg /d, low sodium, etc. ~  1/16: on ASA81 & Cozaar100; BP= 142/90 but hasn't taken meds today; remains asymptomatic;  reminded to take med daily in AM...  VARICOSE VEINS, LOWER EXTREMITIES (ICD-454.9) - she has been eval at the Vein clinic and had VV surg by DrFeatherston (sclerotherapy w/o help she says)... she avoids sodium, elevates legs, wears support hose...  HYPERCHOLESTEROLEMIA (ICD-272.0) - on  diet alone and has a high HDL... ~  last FLP was 6/05 showing TChol 236, Tg 148, HDL 83, LDL 125... I told her she may need a low dose statin med to get her TChol & LDL to goal... ~  FLP 10/10 showed TChol 222, TG 63, HDL 92, LDL 118... rec better diet effort. ~  FLP 1/13 on diet alone showed  TChol 205, TG 27, HDL 92, LDL 98 ~  FLP 1/16 on diet alone showed TChol 237, TG 71, HDL 78, LDL 145... rec to start [facility]- 1/2 tab daily...  GERD (ICD-530.81) - occas reflux symptoms treated w/ Prilosec vs Zantac OTC...  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 follow up planned 30yrs & we will refer to GI for this important screening procedure. ~  F/u colonoscopy done 11/12 by DrPatterson was neg> no polyps or cancers, no rectal mass, f/u suggested in 12yrs...  HEADACHE (ICD-784.0) - she believes related to stress...  VITAMIN D DEFICIENCY >> ~  Labs 4/14 showed Vit D level = 32 and she is rec to start OTC Vit D supplement ~1000u daily (she didn't do it). ~  Labs 1/16 showed Vit D level = 17 and she is rec to start OTC Vit D supplement = 5000u daily...  ANXIETY (ICD-300.00) - she uses LORAZEPAM 0.5mg  as needed for anxiety... eg. during storms etc...  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  Health Maintenance:  Also takes ASA 81mg /d... GYN= DrHorvath, on Premarin 0.3mg /d, s/p hysterectomy... Mammograms yearly at [facility] (she has implants)- and they follow closely... she gets the seasonal Flu vaccine yearly... given TDAP booster 10/11...   Past  Surgical History  Procedure Laterality Date  . Vaginal hysterectomy    . Bilateral breast implants    . Benign breast biopsy    . Varicose vein    . Cataract right eye w/ lens implant    . Excision of large anal polyp    . Colonoscopy    . Polypectomy      Outpatient Encounter Prescriptions as of 09/29/2014  Medication Sig  . aspirin 81 MG tablet Take 81 mg by mouth daily.    Marland Kitchen estrogens, conjugated, (PREMARIN) 0.3 MG tablet Take 0.3 mg by mouth daily. Take daily for 21 days then do not take for 7 days.   Marland Kitchen GARCINIA CAMBOGIA-CHROMIUM PO Take 2 tablets by mouth 2 (two) times daily.  Marland Kitchen LORazepam (ATIVAN) 0.5 MG tablet Take 1/2 to 1 tablet by mouth up to 3 times a day as needed for nerves  . losartan (COZAAR) 100 MG tablet Take 1 tablet (100 mg total) by mouth daily.    No Known Allergies   Current Medications, Allergies, Past Medical History, Past Surgical History, Family History, and Social History were reviewed in [employer] record.    Review of Systems       See HPI - all other systems neg except as noted...  The patient denies anorexia, fever, weight loss, weight gain, vision loss, decreased hearing, hoarseness, chest pain, syncope, dyspnea on exertion, peripheral edema, prolonged cough, headaches, hemoptysis, abdominal pain, melena, hematochezia, severe indigestion/heartburn, hematuria, incontinence, muscle weakness, suspicious skin lesions, transient blindness, difficulty walking, depression, unusual weight change, abnormal bleeding, enlarged lymph nodes, and angioedema.    Objective:   Physical Exam     WD, WN, 63  y/o BF in NAD... GENERAL:  Alert & oriented; pleasant & cooperative... HEENT:  /AT, EOM-wnl, PERRLA, EACs-clear, TMs-wnl, NOSE-clear, THROAT-clear & wnl. NECK:  Supple w/ full ROM; no JVD; normal carotid impulses w/o bruits; no thyromegaly or nodules palpated; no lymphadenopathy. CHEST:  Clear to P & A; without wheezes/ rales/ or  rhonchi. HEART:  Regular Rhythm; without murmurs/ rubs/ or gallops. ABDOMEN:  Soft & nontender; normal bowel sounds; no organomegaly or masses detected. EXT: without deformities or arthritic changes; no varicose veins/ venous insuffic/ or edema. NEURO:  CN's intact; motor testing normal; sensory testing normal; gait normal & balance OK. DERM:  No lesions noted; no rash etc...  RADIOLOGY DATA:  Reviewed in the EPIC EMR & discussed w/ the patient...  LABORATORY DATA:  Reviewed in the EPIC EMR & discussed w/ the patient...   Assessment:      HBP> started on LOSARTAN100 11/15 and BP improved on Rx; not checking BP at home & reminded to do so; f/u BP here 142/90 but didn't take meds this AM... She will take med everyday & monitor BP at home...  VV, VI>  Prev eval at the Vein clinic w/ surg by DrFeatherston...  CHOL>  On diet alone & FLP looked ok in 2013...  GI> GERD, Polyps>  She uses OTC acid suppressors & had f/u colonoscopy 11/12 (neg)...  Hx HAs>  She denies recent HAs etc...  Vit D Defic>  She was not taking VitD supplements as requested; labs 1/16 showed Vit D level = 17 & she s rec to start VitD 5000u daily  Anxiety>  She uses Lorazepam as needed & requests refill of this med...     Plan:     Patient's Medications  New Prescriptions                                                                   Vit D supplement  ~5000u  Daily...   Previous Medications   ASPIRIN 81 MG TABLET    Take 81 mg by mouth daily.     ESTROGENS, CONJUGATED, (PREMARIN) 0.3 MG TABLET    Take 0.3 mg by mouth daily. Take daily for 21 days then do not take for 7 days.    GARCINIA CAMBOGIA-CHROMIUM PO    Take 2 tablets by mouth 2 (two) times daily.   LORAZEPAM (ATIVAN) 0.5 MG TABLET    Take 1/2 to 1 tablet by mouth up to 3 times a day as needed for nerves   LOSARTAN (COZAAR) 100 MG TABLET    Take 1 tablet (100 mg total) by mouth daily.  Modified Medications   No medications on file  Discontinued  Medications   No medications on file

## 2014-09-29 NOTE — Patient Instructions (Addendum)
Today we updated your med list in our EPIC system...    Continue your current medications the same...  Today we did your follow up FASTING blood work...    We will contact you w/ the results when available...   Check you BP at home periodically & call for any questions...  Let's plan a follow up visit in 37mo, sooner if needed for problems.Marland KitchenMarland Kitchen

## 2014-09-30 LAB — LIPID PANEL
CHOL/HDL RATIO: 3
Cholesterol: 237 mg/dL — ABNORMAL HIGH (ref 0–200)
HDL: 77.7 mg/dL (ref >39.00)
LDL Cholesterol: 145 mg/dL — ABNORMAL HIGH (ref 0–99)
NONHDL: 159.3
Triglycerides: 71 mg/dL (ref 0.0–149.0)
VLDL: 14.2 mg/dL (ref 0.0–40.0)

## 2015-01-06 ENCOUNTER — Other Ambulatory Visit: Payer: Self-pay | Admitting: Obstetrics and Gynecology

## 2015-01-07 LAB — CYTOLOGY - PAP

## 2015-01-23 ENCOUNTER — Other Ambulatory Visit: Payer: Self-pay | Admitting: Pulmonary Disease

## 2015-01-23 MED ORDER — LOSARTAN POTASSIUM 100 MG PO TABS: 100.0000 mg | ORAL_TABLET | Freq: Every day | ORAL | Status: DC

## 2015-01-23 NOTE — Telephone Encounter (Signed)
Refill request received from [facility] to refill pt's losartan with a 90 day supply. Per SN, ok to refill with no additional refills. Form faxed back to [facility]

## 2015-03-24 ENCOUNTER — Encounter: Payer: Self-pay | Admitting: Gastroenterology

## 2015-03-30 ENCOUNTER — Ambulatory Visit (INDEPENDENT_AMBULATORY_CARE_PROVIDER_SITE_OTHER): Payer: Federal, State, Local not specified - PPO | Admitting: Pulmonary Disease

## 2015-03-30 ENCOUNTER — Encounter: Payer: Self-pay | Admitting: Pulmonary Disease

## 2015-03-30 VITALS — BP 134/70 | HR 87 | Temp 98.2°F | Wt 126.4 lb

## 2015-03-30 DIAGNOSIS — K219 Gastro-esophageal reflux disease without esophagitis: Secondary | ICD-10-CM | POA: Diagnosis not present

## 2015-03-30 DIAGNOSIS — E7800 Pure hypercholesterolemia, unspecified: Secondary | ICD-10-CM

## 2015-03-30 DIAGNOSIS — I10 Essential (primary) hypertension: Secondary | ICD-10-CM | POA: Diagnosis not present

## 2015-03-30 DIAGNOSIS — E559 Vitamin D deficiency, unspecified: Secondary | ICD-10-CM | POA: Diagnosis not present

## 2015-03-30 NOTE — Progress Notes (Signed)
Subjective:     Patient ID: [name] [name], female   DOB: 27-Nov-1950, 64 y.o.   MRN: [id number]  HPI 64 y/o BF here for a follow up visit... she has mult med problems as noted....  ~  SEE PREV [name] NOTES FOR OLDER DATA >>   ~  July 14, 2010:  she has had a good yr- no new complaints or concerns, just incr stress w/ mother... her insurance comp changed her Avalide to Avapro + HCTZ for some reason- BP remains well controlled;  she has hx sl elev TChol/ LDL but good HDL & she doesn't want meds Rx (committed to diet alone);  weight= 126# is down 9# in the last yr;  GI is stable & she is due for 26yr f/u colon- refer to [name];  she is on Premarin, black cohosh, calcium, vits per GYN... OK 2011 Flu vaccine & TDAP booster today...  ~  September 27, 2011:  Yearly ROV & CPX> [name] reports a good yr w/o new complaints or concerns & she has a new Huskie dog "[name]";  She saw TP 9/12 w/ fatigue & labs showed K=3.0 (due to HCTZ), supplemented & feels better; we discussed checking CXR (she didn't go to basement for this film), EKG (NSR, rate60, WNL), & ret for fasting labs (she never did- we will call w/ reminder)... See Prob List>>  ~  January 01, 2013:  43mo ROV & CPX> & [name] is stable, good interval w/o new complaints or concerns- just painful left bubion & we discussed referral to Podiatry when she is ready... We reviewed the following medical problems during today's office visit >>     HBP> on ASA81, Avapro150, HCT12.5, K20; BP= 138/78 & she denies CP, palpit, dizzy, SOB, edema, etc...    VV> she knows to elim sodium, elev legs, wear support hose...    CHOL> on diet alone; last FLP 2/13 showed TChol 205, TG 27, HDL 92, LDL 98; she is not fasting for blood work today...    GI- GERD, Polyps> on OTC Prilosec vs Zantac prn; f/u colon 11/12 by [name] was neg & he rec f/u 73yrs...    GYN> on Premarin0.3 and BlackCohosh [name] she is s/p hyst...    Anxiety> on Ativan 0.5mg - 1/2 to 1 Tid prn;  stress w/ care of motherLorenza [name]...    Hx idiopathic urticaria> she believed this was due to wt lifting in the past; no recurrence... We reviewed prob list, meds, xrays and labs> see below for updates >> she had the 2013 Flu vaccine 10/13...  LABS 4/14:  Chems- wnl w/ K=3.6;  CBC- wnl;  TSH=1.90;  VitD=32 & rec to take 1000u OTC supplement daily...  ~  July 28, 2014:  23mo ROV & review of medical problems> [name] states that she has been doing fine- feels well & asymptomatic but she ran out of her BP meds ~43mo ago & never called for a refill; now she notes that home BP check are running in the 170/90 range and BP confirmed 180/90 today; we discussed need to call the office anytime BP is elev, she has questions about meds/ what to do/ refills/ etc... We reviewed the following medical problems during today's office visit >>     HBP> on ASA81 & ran out of Avapro150/ HCT12.5/ K20- all ~57mo ago; BP= 180/90 & she denies CP, palpit, dizzy, SOB, edema, etc; we decided to change to LOSARTAN100mg /d, low sodium, etc...    VV> she knows  to elim sodium, elev legs, wear support hose; denies pain, swelling, leg lesions...    CHOL> on diet alone; last FLP 2/13 showed TChol 205, TG 27, HDL 92, LDL 98; she is not fasting for blood work today...    GI- GERD, Polyps> on OTC Prilosec vs Zantac prn; f/u colon 11/12 by [name] was neg & he rec f/u 71yrs...    GYN> on Premarin0.3 and BlackCohosh per [name] (seen yearly); she is s/p hyst; mammograms neg at [name] annually (last 6/15)...    Anxiety> on Ativan 0.5mg - 1/2 to 1 Tid prn; stress w/ care of motherLorenza [name]; med refilled...    Hx idiopathic urticaria> she believed this was due to wt lifting in the past; no recurrence... We reviewed prob list, meds, xrays and labs> see below for updates >> OK 2015 Flu vaccine today, she will need Prevnar-13 in follow up...  CXR 11/15 showed norm heart size, clear lungs, NAD.Marland KitchenMarland Kitchen  EKG 11/15 showed NSR,  rate71, wnl, NAD... PLAN>>  She is not fasting today for blood work; we reviewed the importance of staying on meds & the need to call us for any questions (or refills if Pharm won't co-operate); REC changing to LOSARTAN100mg /d, no salt, monitor BP at home & call for questions; we plan ROV 67mo w/ FASTING labs and BP check...   ~  September 29, 2014:  67mo ROV & [name] [name] is here for a follow up on her BP- now back on meds= Cozaar100 + low salt diet etc;  She notes that she has not yet taken today's dose & BP here is 142/90; she has not been checking her BP at home; med tolerated well & she is feeling well w/o CP/ palpit/ SOB/ edema/ etc... She did her FASTING blood work today (see below)...    CHOL> on diet alone; FLP 1/16 showed TChol 237, TG 71, HDL 78, LDL 145; this is the worst she has had & rec to start Simva40- take 1/2 tab daily...    Vit D Defic> Labs 1/16 showed Vit D level = 17 & rec to start OTC VitD supplement ~5000u daily... We reviewed prob list, meds, xrays and labs> see below for updates >>   LABS 1/16:  FLP- not at goals on diet alone w/ LDL=145;  Chems- ok x K=3.3 & rec dietary supplement;  CBC- wnl;  TSH=1.78;  VitD=17...  ~  March 30, 2015:  5mo ROV & [name] [name] reports doing well, BP controlled on her regular Cozaar100 (tol well) w/ BP=134/70 today & she denies HA, visual sx, CP, palpit, SOB, edema, etc... She decided NOT to restart the Simva20 & wants to control her Chol w/ diet + exercise (we reviewed these today...  Finally she has continued the VitD supplement 5000u daily... EXAM shows Afeb, VSS, O2sat=99% on RA;  HEENT- neg, mallampati1;  Chest- clear w/o w/r/r;  Heart- RR w/o m/r/g;  Abd- neg- soft, nondender;  Ext- w/o c/c/e;  Neuro- intact... We reviewed prob list, meds, xrays and labs>  PLAN>>  [name] [name] is stable on her diet + Cozaar100, ASA81, VitD5000; we reviewed diet & exercise program...            Problem List:  HYPERTENSION (ICD-401.9) -  ~  Prev on AVAPRO 150mg /d + HCTZ  12.5mg /d & K20 added 9/12 (taking 1/2 tab daily she says) w/ good control...  ~  10/11:  BP= 126/78, tolerating rx well... denies HA, fatigue, visual changes, CP, palipit, dizziness, syncope, dyspnea, edema, etc... ~  1/13:  BP= 132/80 & her fatigue is resolved, feeling better, currently asymptomatic... ~  4/14: on ASA81, Avapro150, HCT12.5, K20; BP= 138/78 & she denies CP, palpit, dizzy, SOB, edema, etc. ~  11/15: on ASA81 & ran out of Avapro150/ HCT12.5/ K20- all ~67mo ago; BP= 180/90 & she denies CP, palpit, dizzy, SOB, edema, etc; we decided to change to LOSARTAN100mg /d, low sodium, etc. ~  1/16: on ASA81 & Cozaar100; BP= 142/90 but hasn't taken meds today; remains asymptomatic; reminded to take med daily in AM... ~  7/16: BP controlled on her regular Cozaar100 (tol well) w/ BP=134/70 today & she denies HA, visual sx, CP, palpit, SOB, edema, etc...  VARICOSE VEINS, LOWER EXTREMITIES (ICD-454.9) - she has been eval at the Vein clinic and had VV surg by DrFeatherston (sclerotherapy w/o help she says)... she avoids sodium, elevates legs, wears support hose...  HYPERCHOLESTEROLEMIA (ICD-272.0) - on diet alone and has a high HDL... ~  last FLP was 6/05 showing TChol 236, Tg 148, HDL 83, LDL 125... I told her she may need a low dose statin med to get her TChol & LDL to goal... ~  FLP 10/10 showed TChol 222, TG 63, HDL 92, LDL 118... rec better diet effort. ~  FLP 1/13 on diet alone showed  TChol 205, TG 27, HDL 92, LDL 98 ~  FLP 1/16 on diet alone showed TChol 237, TG 71, HDL 78, LDL 145... rec to start [facility]- 1/2 tab daily (she chose not to & prefers diet alone)...  GERD (ICD-530.81) - occas reflux symptoms treated w/ Prilosec vs Zantac OTC...  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 follow up planned 80yrs & we will refer to GI for this important screening procedure. ~  F/u colonoscopy done  11/12 by [name] [name] was neg> no polyps or cancers, no rectal mass, f/u suggested in 43yrs...  HEADACHE (ICD-784.0) - she believes related to stress...  VITAMIN D DEFICIENCY >> ~  Labs 4/14 showed Vit D level = 32 and she is rec to start OTC Vit D supplement ~1000u daily (she didn't do it). ~  Labs 1/16 showed Vit D level = 17 and she is rec to start OTC Vit D supplement = 5000u daily...  ANXIETY (ICD-300.00) - she uses LORAZEPAM 0.5mg  as needed for anxiety... eg. during storms etc...  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  Health Maintenance:  Also takes ASA 81mg /d... GYN= DrHorvath, on Premarin 0.3mg /d, s/p hysterectomy... Mammograms yearly at [name] (she has implants)- and they follow closely... she gets the seasonal Flu vaccine yearly... given TDAP booster 10/11...   Past Surgical History  Procedure Laterality Date  . Vaginal hysterectomy    . Bilateral breast implants    . Benign breast biopsy    . Varicose vein    . Cataract right eye w/ lens implant    . Excision of large anal polyp    . Colonoscopy    . Polypectomy      Outpatient Encounter Prescriptions as of 03/30/2015  Medication Sig  . aspirin 81 MG tablet Take 81 mg by mouth daily.    Marland Kitchen estrogens, conjugated, (PREMARIN) 0.3 MG tablet Take 0.3 mg by mouth daily. Take daily for 21 days then do not take for 7 days.   Marland Kitchen GARCINIA CAMBOGIA-CHROMIUM PO Take 2 tablets by mouth 2 (two) times daily.  Marland Kitchen  LORazepam (ATIVAN) 0.5 MG tablet Take 1/2 to 1 tablet by mouth up to 3 times a day as needed for nerves  . losartan (COZAAR) 100 MG tablet Take 1 tablet (100 mg total) by mouth daily.   No facility-administered encounter medications on file as of 03/30/2015.    No Known Allergies   Current Medications, Allergies, Past Medical History, Past Surgical History, Family History, and Social History were reviewed in [employer] record.     Review of Systems       See HPI - all other systems neg except as noted...  The patient denies anorexia, fever, weight loss, weight gain, vision loss, decreased hearing, hoarseness, chest pain, syncope, dyspnea on exertion, peripheral edema, prolonged cough, headaches, hemoptysis, abdominal pain, melena, hematochezia, severe indigestion/heartburn, hematuria, incontinence, muscle weakness, suspicious skin lesions, transient blindness, difficulty walking, depression, unusual weight change, abnormal bleeding, enlarged lymph nodes, and angioedema.    Objective:   Physical Exam     WD, WN, 64 y/o BF in NAD... GENERAL:  Alert & oriented; pleasant & cooperative... HEENT:  [place]/AT, EOM-wnl, PERRLA, EACs-clear, TMs-wnl, NOSE-clear, THROAT-clear & wnl. NECK:  Supple w/ full ROM; no JVD; normal carotid impulses w/o bruits; no thyromegaly or nodules palpated; no lymphadenopathy. CHEST:  Clear to P & A; without wheezes/ rales/ or rhonchi. HEART:  Regular Rhythm; without murmurs/ rubs/ or gallops. ABDOMEN:  Soft & nontender; normal bowel sounds; no organomegaly or masses detected. EXT: without deformities or arthritic changes; no varicose veins/ venous insuffic/ or edema. NEURO:  CN's intact; motor testing normal; sensory testing normal; gait normal & balance OK. DERM:  No lesions noted; no rash etc...  RADIOLOGY DATA:  Reviewed in the [name] EMR & discussed w/ the patient...  LABORATORY DATA:  Reviewed in the [name] EMR & discussed w/ the patient...   Assessment:      HBP> started on LOSARTAN100 11/15 and BP improved on Rx; not checking BP at home & reminded to do so; f/u BP here 134/70 & rec to continue the same...  VV, VI>  Prev eval at the Vein clinic w/ surg by DrFeatherston...  CHOL>  On diet alone & FLP looked ok in 2013...  GI> GERD, Polyps>  She uses OTC acid suppressors & had f/u colonoscopy 11/12 (neg)...  Hx HAs>  She denies recent HAs etc...  Vit D Defic>  She was not taking VitD  supplements as requested; labs 1/16 showed Vit D level = 17 & she s rec to start VitD 5000u daily  Anxiety>  She uses Lorazepam as needed & requests refill of this med...     Plan:     Patient's Medications  New Prescriptions   No medications on file  Previous Medications   ASPIRIN 81 MG TABLET    Take 81 mg by mouth daily.     CHOLECALCIFEROL (VITAMIN D-3) 1000 UNITS CAPS    Take 1 capsule by mouth daily.   ESTROGENS, CONJUGATED, (PREMARIN) 0.3 MG TABLET    Take 0.3 mg by mouth daily. Take daily for 21 days then do not take for 7 days.    GARCINIA CAMBOGIA-CHROMIUM PO    Take 2 tablets by mouth 2 (two) times daily.   LORAZEPAM (ATIVAN) 0.5 MG TABLET    Take 1/2 to 1 tablet by mouth up to 3 times a day as needed for nerves  Modified Medications   Modified Medication Previous Medication   LOSARTAN (COZAAR) 100 MG TABLET losartan (COZAAR) 100 MG tablet  TAKE 1 TABLET (100 MG TOTAL) BY MOUTH DAILY.    Take 1 tablet (100 mg total) by mouth daily.  Discontinued Medications   No medications on file

## 2015-03-30 NOTE — Patient Instructions (Signed)
Today we updated your med list in our EPIC system...    Continue your current medications the same...  We decided to leave you off any cholesterol meds for now but you must follow a strict low cholestrol/ low fat diet...    We will recheck you fasting lipid profile on return...  Continue the Vit D supplement as you are doing...  Call for any questions...  Let's plan a follow up visit in 58mo, sooner if needed for problems.Marland KitchenMarland Kitchen

## 2015-05-26 ENCOUNTER — Other Ambulatory Visit: Payer: Self-pay | Admitting: Pulmonary Disease

## 2015-07-21 ENCOUNTER — Encounter: Payer: Self-pay | Admitting: Pulmonary Disease

## 2015-08-25 ENCOUNTER — Ambulatory Visit (INDEPENDENT_AMBULATORY_CARE_PROVIDER_SITE_OTHER): Payer: Federal, State, Local not specified - PPO

## 2015-08-25 DIAGNOSIS — Z23 Encounter for immunization: Secondary | ICD-10-CM

## 2015-09-30 ENCOUNTER — Ambulatory Visit (INDEPENDENT_AMBULATORY_CARE_PROVIDER_SITE_OTHER): Payer: Federal, State, Local not specified - PPO | Admitting: Pulmonary Disease

## 2015-09-30 ENCOUNTER — Encounter: Payer: Self-pay | Admitting: Pulmonary Disease

## 2015-09-30 VITALS — BP 138/64 | HR 66 | Temp 97.8°F | Ht 63.0 in | Wt 133.6 lb

## 2015-09-30 DIAGNOSIS — Z0000 Encounter for general adult medical examination without abnormal findings: Secondary | ICD-10-CM | POA: Diagnosis not present

## 2015-09-30 DIAGNOSIS — E7800 Pure hypercholesterolemia, unspecified: Secondary | ICD-10-CM | POA: Diagnosis not present

## 2015-09-30 DIAGNOSIS — I10 Essential (primary) hypertension: Secondary | ICD-10-CM | POA: Diagnosis not present

## 2015-09-30 NOTE — Patient Instructions (Signed)
Today we updated your med list in our EPIC system...    Continue your current medications the same...  Please return to our lab at your convenience for your FASTING blood work...    We will contact you w/ the results when available...   Keep up the good work w/ diet, exercise, etc...  Call for any questions...  Let's plan a follow up visit in 70yr, sooner if needed for problems.Marland KitchenMarland Kitchen

## 2015-09-30 NOTE — Progress Notes (Signed)
Subjective:     Patient ID: [name] [name], female   DOB: [birth year], 65 y.o.   MRN: [id number]  HPI 65 y/o BF here for a follow up visit... she has mult med problems as noted....  ~  SEE PREV EPIC NOTES FOR OLDER DATA >>   ~  July 14, 2010:  she has had a good yr- no new complaints or concerns, just incr stress w/ mother... her insurance comp changed her Avalide to Avapro + HCTZ for some reason- BP remains well controlled;  she has hx sl elev TChol/ LDL but good HDL & she doesn't want meds Rx (committed to diet alone);  weight= 126# is down 9# in the last yr;  GI is stable & she is due for 48yr f/u colon- refer to DrPatterson;  she is on Premarin, black cohosh, calcium, vits per GYN... OK 2011 Flu vaccine & TDAP booster today...  ~  September 27, 2011:  Yearly ROV & CPX> [name] [name] reports a good yr w/o new complaints or concerns & she has a new Huskie dog "[name] [name]";  She saw TP 9/12 w/ fatigue & labs showed K=3.0 (due to HCTZ), supplemented & feels better; we discussed checking CXR (she didn't go to basement for this film), EKG (NSR, rate60, WNL), & ret for fasting labs (she never did- we will call w/ reminder)... See Prob List>>  ~  January 01, 2013:  45mo ROV & CPX> & [name] [name] is stable, good interval w/o new complaints or concerns- just painful left bubion & we discussed referral to Podiatry when she is ready... We reviewed the following medical problems during today's office visit >>     HBP> on ASA81, Avapro150, HCT12.5, K20; BP= 138/78 & she denies CP, palpit, dizzy, SOB, edema, etc...    VV> she knows to elim sodium, elev legs, wear support hose...    CHOL> on diet alone; last FLP 2/13 showed TChol 205, TG 27, HDL 92, LDL 98; she is not fasting for blood work today...    GI- GERD, Polyps> on OTC Prilosec vs Zantac prn; f/u colon 11/12 by DrPatterson was neg & he rec f/u 49yrs...    GYN> on Premarin0.3 and BlackCohosh per [facility]; she is s/p hyst...    Anxiety> on Ativan 0.5mg - 1/2 to 1 Tid prn;  stress w/ care of motherLorenza [name]...    Hx idiopathic urticaria> she believed this was due to wt lifting in the past; no recurrence... We reviewed prob list, meds, xrays and labs> see below for updates >> she had the 2013 Flu vaccine 10/13...  LABS 4/14:  Chems- wnl w/ K=3.6;  CBC- wnl;  TSH=1.90;  VitD=32 & rec to take 1000u OTC supplement daily...  ~  July 28, 2014:  27mo ROV & review of medical problems> [name] [name] states that she has been doing fine- feels well & asymptomatic but she ran out of her BP meds ~42mo ago & never called for a refill; now she notes that home BP check are running in the 170/90 range and BP confirmed 180/90 today; we discussed need to call the office anytime BP is elev, she has questions about meds/ what to do/ refills/ etc... We reviewed the following medical problems during today's office visit >>     HBP> on ASA81 & ran out of Avapro150/ HCT12.5/ K20- all ~82mo ago; BP= 180/90 & she denies CP, palpit, dizzy, SOB, edema, etc; we decided to change to LOSARTAN100mg /d, low sodium, etc...    VV> she knows  to elim sodium, elev legs, wear support hose; denies pain, swelling, leg lesions...    CHOL> on diet alone; last FLP 2/13 showed TChol 205, TG 27, HDL 92, LDL 98; she is not fasting for blood work today...    GI- GERD, Polyps> on OTC Prilosec vs Zantac prn; f/u colon 11/12 by DrPatterson was neg & he rec f/u 21yrs...    GYN> on Premarin0.3 and BlackCohosh per Andree Coss (seen yearly); she is s/p hyst; mammograms neg at [facility] annually (last 6/15)...    Anxiety> on Ativan 0.5mg - 1/2 to 1 Tid prn; stress w/ care of motherLorenza [name]; med refilled...    Hx idiopathic urticaria> she believed this was due to wt lifting in the past; no recurrence... We reviewed prob list, meds, xrays and labs> see below for updates >> OK 2015 Flu vaccine today, she will need Prevnar-13 in follow up...  CXR 11/15 showed norm heart size, clear lungs, NAD.Marland KitchenMarland Kitchen  EKG 11/15 showed NSR,  rate71, wnl, NAD... PLAN>>  She is not fasting today for blood work; we reviewed the importance of staying on meds & the need to call us for any questions (or refills if Pharm won't co-operate); REC changing to LOSARTAN100mg /d, no salt, monitor BP at home & call for questions; we plan ROV 47mo w/ FASTING labs and BP check...   ~  September 29, 2014:  47mo ROV & [name] [name] is here for a follow up on her BP- now back on meds= Cozaar100 + low salt diet etc;  She notes that she has not yet taken today's dose & BP here is 142/90; she has not been checking her BP at home; med tolerated well & she is feeling well w/o CP/ palpit/ SOB/ edema/ etc... She did her FASTING blood work today (see below)...    CHOL> on diet alone; FLP 1/16 showed TChol 237, TG 71, HDL 78, LDL 145; this is the worst she has had & rec to start Simva40- take 1/2 tab daily...    Vit D Defic> Labs 1/16 showed Vit D level = 17 & rec to start OTC VitD supplement ~5000u daily... We reviewed prob list, meds, xrays and labs> see below for updates >>   LABS 1/16:  FLP- not at goals on diet alone w/ LDL=145;  Chems- ok x K=3.3 & rec dietary supplement;  CBC- wnl;  TSH=1.78;  VitD=17...  ~  March 30, 2015:  77mo ROV & [name] [name] reports doing well, BP controlled on her regular Cozaar100 (tol well) w/ BP=134/70 today & she denies HA, visual sx, CP, palpit, SOB, edema, etc... She decided NOT to restart the Simva20 & wants to control her Chol w/ diet + exercise (we reviewed these today...  Finally she has continued the VitD supplement 5000u daily... EXAM shows Afeb, VSS, O2sat=99% on RA;  HEENT- neg, mallampati1;  Chest- clear w/o w/r/r;  Heart- RR w/o m/r/g;  Abd- neg- soft, nondender;  Ext- w/o c/c/e;  Neuro- intact... We reviewed prob list, meds, xrays and labs>  PLAN>>  [name] [name] is stable on her diet + Cozaar100, ASA81, VitD5000; we reviewed diet & exercise program...   ~  September 30, 2015:  77mo ROV & annual CPX>  [name] [name] reports doing well, feels good, no new  complaints or concerns;  She remains active walking her [place] Husky "[name] [name]" 63mi/d;  We reviewed the following medical problems during today's office visit >>     HBP> on ASA81 & LOSAR100; BP= 138/64 & she denies CP, palpit, dizzy,  SOB, edema, etc; continue same & low sodium...    VV> prev eval at vein clinic DrFeatherston w/ surg; she knows to elim sodium, elev legs, wear support hose; denies pain, swelling, leg lesions...    CHOL> on diet alone; last FLP 1/16 showed TChol 237, TG 71, HDL 78, LDL 145; we reviewed low chol/ low fat diet...    GI- GERD, Polyps> on OTC Prilosec vs Zantac prn; f/u colon 11/12 by DrPatterson was neg & he rec f/u 87yrs...    GYN> on Premarin0.3 and BlackCohosh per Andree Coss (seen yearly); she is s/p hyst; mammograms neg at [facility] annually (last 10/16)...    VitD defic> VitD level 1/16 = 17 & asked to incr OTC supplement to 5000u daily but she didn't do it...     Anxiety> on Ativan 0.5mg - 1/2 to 1 Tid prn; stress w/ care of motherLorenza [name]; med refilled...    Hx idiopathic urticaria> she believed this was due to wt lifting in the past; no recurrence... EXAM shows Afeb, VSS, O2sat=100% on RA;  HEENT- neg, mallampati2;  Chest- clear w/o w/r/r;  Heart- RR w/o m/r/g;  Abd- soft, neg;  Ext- neg w/o c/c/e;  Neuro- intact...  FASTING LABS>  She is not fasting today & will ret for labs at her convenience...  IMP/PLAN>>  [name] [name] is stable & rec to continue same meds, ret for FLabs, consider low dose statin med for her lipids (she declines)...           Problem List:  HYPERTENSION (ICD-401.9) -  ~  Prev on AVAPRO 150mg /d + HCTZ 12.5mg /d & K20 added 9/12 (taking 1/2 tab daily she says) w/ good control...  ~  10/11:  BP= 126/78, tolerating rx well... denies HA, fatigue, visual changes, CP, palipit, dizziness, syncope, dyspnea, edema, etc... ~  1/13:  BP= 132/80 & her fatigue is resolved, feeling better, currently asymptomatic... ~  4/14: on ASA81, Avapro150, HCT12.5,  K20; BP= 138/78 & she denies CP, palpit, dizzy, SOB, edema, etc. ~  11/15: on ASA81 & ran out of Avapro150/ HCT12.5/ K20- all ~100mo ago; BP= 180/90 & she denies CP, palpit, dizzy, SOB, edema, etc; we decided to change to LOSARTAN100mg /d, low sodium, etc. ~  1/16: on ASA81 & Cozaar100; BP= 142/90 but hasn't taken meds today; remains asymptomatic; reminded to take med daily in AM... ~  7/16: BP controlled on her regular Cozaar100 (tol well) w/ BP=134/70 today & she denies HA, visual sx, CP, palpit, SOB, edema, etc... ~  1/17: on ASA81 & LOSAR100; BP= 138/64 & she denies CP, palpit, dizzy, SOB, edema, etc; continue same & low sodium.  VARICOSE VEINS, LOWER EXTREMITIES (ICD-454.9) - she has been eval at the Vein clinic and had VV surg by DrFeatherston (sclerotherapy w/o help she says)... she avoids sodium, elevates legs, wears support hose...  HYPERCHOLESTEROLEMIA (ICD-272.0) - on diet alone and has a high HDL... ~  last FLP was 6/05 showing TChol 236, Tg 148, HDL 83, LDL 125... I told her she may need a low dose statin med to get her TChol & LDL to goal... ~  FLP 10/10 showed TChol 222, TG 63, HDL 92, LDL 118... rec better diet effort. ~  FLP 1/13 on diet alone showed  TChol 205, TG 27, HDL 92, LDL 98 ~  FLP 1/16 on diet alone showed TChol 237, TG 71, HDL 78, LDL 145... rec to start [facility]- 1/2 tab daily (she chose not to & prefers diet alone)... ~  FLP 1/17  on diet alone - pending  GERD (ICD-530.81) - occas reflux symptoms treated w/ Prilosec vs Zantac OTC...  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 follow up planned 13yrs & we will refer to GI for this important screening procedure. ~  F/u colonoscopy done 11/12 by DrPatterson was neg> no polyps or cancers, no rectal mass, f/u suggested in 79yrs...  HEADACHE (ICD-784.0) - she believes related to stress...  VITAMIN D DEFICIENCY >> ~  Labs  4/14 showed Vit D level = 32 and she is rec to start OTC Vit D supplement ~1000u daily (she didn't do it). ~  Labs 1/16 showed Vit D level = 17 and she is rec to start OTC Vit D supplement = 5000u daily...  ANXIETY (ICD-300.00) - she uses LORAZEPAM 0.5mg  as needed for anxiety... eg. during storms etc...  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  Health Maintenance:  Also takes ASA 81mg /d... GYN= DrHorvath, on Premarin 0.3mg /d, s/p hysterectomy... Mammograms yearly at [facility] (she has implants)- and they follow closely... she gets the seasonal Flu vaccine yearly... given TDAP booster 10/11...   Past Surgical History  Procedure Laterality Date  . Vaginal hysterectomy    . Bilateral breast implants    . Benign breast biopsy    . Varicose vein    . Cataract right eye w/ lens implant    . Excision of large anal polyp    . Colonoscopy    . Polypectomy      Outpatient Encounter Prescriptions as of 09/30/2015  Medication Sig  . aspirin 81 MG tablet Take 81 mg by mouth daily.    . Cholecalciferol (VITAMIN D-3) 1000 UNITS CAPS Take 1 capsule by mouth daily.  Marland Kitchen estrogens, conjugated, (PREMARIN) 0.3 MG tablet Take 0.3 mg by mouth daily. Take daily for 21 days then do not take for 7 days.   Marland Kitchen GARCINIA CAMBOGIA-CHROMIUM PO Take 2 tablets by mouth 2 (two) times daily.  Marland Kitchen LORazepam (ATIVAN) 0.5 MG tablet Take 1/2 to 1 tablet by mouth up to 3 times a day as needed for nerves  . losartan (COZAAR) 100 MG tablet TAKE 1 TABLET (100 MG TOTAL) BY MOUTH DAILY.   No facility-administered encounter medications on file as of 09/30/2015.    No Known Allergies    Immunization History  Administered Date(s) Administered  . Influenza Split 06/13/2011, 07/19/2012  . Influenza Whole 07/14/2009, 07/14/2010  . Influenza,inj,Quad PF,36+ Mos 08/13/2013, 07/28/2014, 08/25/2015  . Td 07/14/2010    Current Medications, Allergies, Past Medical  History, Past Surgical History, Family History, and Social History were reviewed in [employer] record.    Review of Systems       See HPI - all other systems neg except as noted...  The patient denies anorexia, fever, weight loss, weight gain, vision loss, decreased hearing, hoarseness, chest pain, syncope, dyspnea on exertion, peripheral edema, prolonged cough, headaches, hemoptysis, abdominal pain, melena, hematochezia, severe indigestion/heartburn, hematuria, incontinence, muscle weakness, suspicious skin lesions, transient blindness, difficulty walking, depression, unusual weight change, abnormal bleeding, enlarged lymph nodes, and angioedema.    Objective:   Physical Exam     WD, WN, 64 y/o BF in NAD... GENERAL:  Alert & oriented; pleasant & cooperative... HEENT:  [place]/AT, EOM-wnl, PERRLA, EACs-clear, TMs-wnl, NOSE-clear, THROAT-clear & wnl. NECK:  Supple w/ full ROM; no JVD; normal carotid  impulses w/o bruits; no thyromegaly or nodules palpated; no lymphadenopathy. CHEST:  Clear to P & A; without wheezes/ rales/ or rhonchi. HEART:  Regular Rhythm; without murmurs/ rubs/ or gallops. ABDOMEN:  Soft & nontender; normal bowel sounds; no organomegaly or masses detected. EXT: without deformities or arthritic changes; no varicose veins/ venous insuffic/ or edema. NEURO:  CN's intact; motor testing normal; sensory testing normal; gait normal & balance OK. DERM:  No lesions noted; no rash etc...  RADIOLOGY DATA:  Reviewed in the EPIC EMR & discussed w/ the patient...  LABORATORY DATA:  Reviewed in the EPIC EMR & discussed w/ the patient...   Assessment:      HBP> started on LOSARTAN100 11/15 and BP improved on Rx; not checking BP at home & reminded to do so; f/u BP here 138/64 & rec to continue the same...  VV, VI>  Prev eval at the Vein clinic w/ surg by DrFeatherston...  CHOL>  On diet alone & FLP has not been at goal- she will ret for FLP soon...  GI> GERD,  Polyps>  She uses OTC acid suppressors & had f/u colonoscopy 11/12 (neg)...  Hx HAs>  She denies recent HAs etc...  Vit D Defic>  She was not taking VitD supplements as requested; labs 1/16 showed Vit D level = 17 & she s rec to start VitD 5000u daily but she reverted to OTC 1000u supplement!  Anxiety>  She uses Lorazepam as needed & requests refill of this med...     Plan:     Patient's Medications  New Prescriptions   No medications on file  Previous Medications   ASPIRIN 81 MG TABLET    Take 81 mg by mouth daily.     CHOLECALCIFEROL (VITAMIN D-3) 1000 UNITS CAPS    Take 1 capsule by mouth daily.   ESTROGENS, CONJUGATED, (PREMARIN) 0.3 MG TABLET    Take 0.3 mg by mouth daily. Take daily for 21 days then do not take for 7 days.    GARCINIA CAMBOGIA-CHROMIUM PO    Take 2 tablets by mouth 2 (two) times daily.   LORAZEPAM (ATIVAN) 0.5 MG TABLET    Take 1/2 to 1 tablet by mouth up to 3 times a day as needed for nerves   LOSARTAN (COZAAR) 100 MG TABLET    TAKE 1 TABLET (100 MG TOTAL) BY MOUTH DAILY.  Modified Medications   No medications on file  Discontinued Medications   No medications on file

## 2015-12-15 ENCOUNTER — Emergency Department (HOSPITAL_COMMUNITY): Payer: Federal, State, Local not specified - PPO

## 2015-12-15 ENCOUNTER — Observation Stay (HOSPITAL_COMMUNITY)
Admission: EM | Admit: 2015-12-15 | Discharge: 2015-12-16 | Disposition: A | Discharge status: Home / Self Care | Payer: Federal, State, Local not specified - PPO | Attending: Internal Medicine | Admitting: Internal Medicine

## 2015-12-15 ENCOUNTER — Encounter (HOSPITAL_COMMUNITY): Payer: Self-pay | Admitting: *Deleted

## 2015-12-15 DIAGNOSIS — I10 Essential (primary) hypertension: Secondary | ICD-10-CM | POA: Diagnosis present

## 2015-12-15 DIAGNOSIS — K219 Gastro-esophageal reflux disease without esophagitis: Secondary | ICD-10-CM | POA: Insufficient documentation

## 2015-12-15 DIAGNOSIS — D329 Benign neoplasm of meninges, unspecified: Secondary | ICD-10-CM | POA: Diagnosis not present

## 2015-12-15 DIAGNOSIS — G9389 Other specified disorders of brain: Secondary | ICD-10-CM | POA: Diagnosis not present

## 2015-12-15 DIAGNOSIS — Z803 Family history of malignant neoplasm of breast: Secondary | ICD-10-CM | POA: Diagnosis not present

## 2015-12-15 DIAGNOSIS — Z8673 Personal history of transient ischemic attack (TIA), and cerebral infarction without residual deficits: Secondary | ICD-10-CM | POA: Diagnosis not present

## 2015-12-15 DIAGNOSIS — R930 Abnormal findings on diagnostic imaging of skull and head, not elsewhere classified: Secondary | ICD-10-CM | POA: Diagnosis not present

## 2015-12-15 DIAGNOSIS — Z961 Presence of intraocular lens: Secondary | ICD-10-CM | POA: Diagnosis not present

## 2015-12-15 DIAGNOSIS — E7800 Pure hypercholesterolemia, unspecified: Secondary | ICD-10-CM | POA: Diagnosis not present

## 2015-12-15 DIAGNOSIS — Z7982 Long term (current) use of aspirin: Secondary | ICD-10-CM | POA: Diagnosis not present

## 2015-12-15 DIAGNOSIS — I6930 Unspecified sequelae of cerebral infarction: Secondary | ICD-10-CM | POA: Insufficient documentation

## 2015-12-15 DIAGNOSIS — K5730 Diverticulosis of large intestine without perforation or abscess without bleeding: Secondary | ICD-10-CM | POA: Diagnosis not present

## 2015-12-15 DIAGNOSIS — Z90710 Acquired absence of both cervix and uterus: Secondary | ICD-10-CM | POA: Diagnosis not present

## 2015-12-15 DIAGNOSIS — Z86010 Personal history of colonic polyps: Secondary | ICD-10-CM | POA: Insufficient documentation

## 2015-12-15 DIAGNOSIS — G939 Disorder of brain, unspecified: Secondary | ICD-10-CM | POA: Diagnosis not present

## 2015-12-15 DIAGNOSIS — Z9841 Cataract extraction status, right eye: Secondary | ICD-10-CM | POA: Diagnosis not present

## 2015-12-15 DIAGNOSIS — R42 Dizziness and giddiness: Secondary | ICD-10-CM | POA: Diagnosis present

## 2015-12-15 DIAGNOSIS — F419 Anxiety disorder, unspecified: Secondary | ICD-10-CM | POA: Insufficient documentation

## 2015-12-15 DIAGNOSIS — R9089 Other abnormal findings on diagnostic imaging of central nervous system: Secondary | ICD-10-CM | POA: Insufficient documentation

## 2015-12-15 LAB — CBC WITH DIFFERENTIAL/PLATELET
BASOS ABS: 0 10*3/uL (ref 0.0–0.1)
BASOS PCT: 0 %
EOS ABS: 0.1 10*3/uL (ref 0.0–0.7)
EOS PCT: 1 %
HCT: 38.5 % (ref 36.0–46.0)
Hemoglobin: 12.9 g/dL (ref 12.0–15.0)
LYMPHS PCT: 26 %
Lymphs Abs: 1.4 10*3/uL (ref 0.7–4.0)
MCH: 29 pg (ref 26.0–34.0)
MCHC: 33.5 g/dL (ref 30.0–36.0)
MCV: 86.5 fL (ref 78.0–100.0)
MONO ABS: 0.3 10*3/uL (ref 0.1–1.0)
Monocytes Relative: 5 %
Neutro Abs: 3.8 10*3/uL (ref 1.7–7.7)
Neutrophils Relative %: 68 %
PLATELETS: 266 10*3/uL (ref 150–400)
RBC: 4.45 MIL/uL (ref 3.87–5.11)
RDW: 13 % (ref 11.5–15.5)
WBC: 5.5 10*3/uL (ref 4.0–10.5)

## 2015-12-15 LAB — COMPREHENSIVE METABOLIC PANEL
ALT: 20 U/L (ref 14–54)
AST: 42 U/L — ABNORMAL HIGH (ref 15–41)
Albumin: 3.9 g/dL (ref 3.5–5.0)
Alkaline Phosphatase: 78 U/L (ref 38–126)
Anion gap: 10 (ref 5–15)
BUN: 11 mg/dL (ref 6–20)
CALCIUM: 9 mg/dL (ref 8.9–10.3)
CHLORIDE: 102 mmol/L (ref 101–111)
CO2: 26 mmol/L (ref 22–32)
Creatinine, Ser: 0.71 mg/dL (ref 0.44–1.00)
GFR calc non Af Amer: >60 mL/min (ref >60)
Glucose, Bld: 119 mg/dL — ABNORMAL HIGH (ref 65–99)
Potassium: 3.4 mmol/L — ABNORMAL LOW (ref 3.5–5.1)
SODIUM: 138 mmol/L (ref 135–145)
Total Bilirubin: 0.7 mg/dL (ref 0.3–1.2)
Total Protein: 7.7 g/dL (ref 6.5–8.1)

## 2015-12-15 LAB — URINALYSIS, ROUTINE W REFLEX MICROSCOPIC
Bilirubin Urine: NEGATIVE
Glucose, UA: NEGATIVE mg/dL
KETONES UR: NEGATIVE mg/dL
LEUKOCYTES UA: NEGATIVE
NITRITE: NEGATIVE
PH: 7.5 (ref 5.0–8.0)
Protein, ur: NEGATIVE mg/dL
Specific Gravity, Urine: 1.008 (ref 1.005–1.030)

## 2015-12-15 LAB — CBG MONITORING, ED: Glucose-Capillary: 116 mg/dL — ABNORMAL HIGH (ref 65–99)

## 2015-12-15 LAB — I-STAT TROPONIN, ED: TROPONIN I, POC: 0.01 ng/mL (ref 0.00–0.08)

## 2015-12-15 LAB — URINE MICROSCOPIC-ADD ON

## 2015-12-15 MED ORDER — LOSARTAN POTASSIUM 50 MG PO TABS
100.0000 mg | ORAL_TABLET | Freq: Every day | ORAL | Status: DC
  Administered 2015-12-16: 100 mg via ORAL
  Filled 2015-12-15: qty 2

## 2015-12-15 MED ORDER — LEVETIRACETAM 500 MG PO TABS
500.0000 mg | ORAL_TABLET | Freq: Two times a day (BID) | ORAL | Status: DC
  Administered 2015-12-16: 500 mg via ORAL
  Filled 2015-12-15: qty 1

## 2015-12-15 MED ORDER — ASPIRIN 81 MG PO TABS: 81.0000 mg | ORAL_TABLET | Freq: Every day | ORAL | Status: DC

## 2015-12-15 MED ORDER — SODIUM CHLORIDE 0.9 % IV BOLUS (SEPSIS)
1000.0000 mL | Freq: Once | INTRAVENOUS | Status: AC
  Administered 2015-12-15: 1000 mL via INTRAVENOUS

## 2015-12-15 MED ORDER — ZOLPIDEM TARTRATE 5 MG PO TABS: 5.0000 mg | ORAL_TABLET | Freq: Every evening | ORAL | Status: DC | PRN

## 2015-12-15 MED ORDER — MECLIZINE HCL 25 MG PO TABS
25.0000 mg | ORAL_TABLET | Freq: Once | ORAL | Status: AC
  Administered 2015-12-15: 25 mg via ORAL
  Filled 2015-12-15: qty 1

## 2015-12-15 MED ORDER — ACETAMINOPHEN 325 MG PO TABS: 650.0000 mg | ORAL_TABLET | Freq: Four times a day (QID) | ORAL | Status: DC | PRN

## 2015-12-15 MED ORDER — METOCLOPRAMIDE HCL 5 MG/ML IJ SOLN
10.0000 mg | Freq: Once | INTRAMUSCULAR | Status: AC
  Administered 2015-12-15: 10 mg via INTRAVENOUS
  Filled 2015-12-15: qty 2

## 2015-12-15 MED ORDER — ONDANSETRON HCL 4 MG/2ML IJ SOLN: 4.0000 mg | Freq: Once | INTRAMUSCULAR | Status: DC

## 2015-12-15 MED ORDER — SODIUM CHLORIDE 0.9 % IV SOLN
1000.0000 mg | Freq: Once | INTRAVENOUS | Status: AC
  Administered 2015-12-15: 1000 mg via INTRAVENOUS
  Filled 2015-12-15 (×2): qty 10

## 2015-12-15 MED ORDER — SODIUM CHLORIDE 0.9 % IV SOLN: 1000.0000 mg | Freq: Two times a day (BID) | INTRAVENOUS | Status: DC

## 2015-12-15 MED ORDER — GADOBENATE DIMEGLUMINE 529 MG/ML IV SOLN
10.0000 mL | Freq: Once | INTRAVENOUS | Status: AC | PRN
  Administered 2015-12-15: 10 mL via INTRAVENOUS

## 2015-12-15 MED ORDER — ACETAMINOPHEN 650 MG RE SUPP
650.0000 mg | Freq: Four times a day (QID) | RECTAL | Status: DC | PRN
Start: 2015-12-15 — End: 2015-12-16

## 2015-12-15 MED ORDER — ESTROGENS CONJUGATED 0.3 MG PO TABS
0.3000 mg | ORAL_TABLET | Freq: Every day | ORAL | Status: DC
  Administered 2015-12-16: 0.3 mg via ORAL
  Filled 2015-12-15 (×2): qty 1

## 2015-12-15 MED ORDER — ONDANSETRON HCL 4 MG/2ML IJ SOLN: 4.0000 mg | Freq: Three times a day (TID) | INTRAMUSCULAR | Status: AC | PRN

## 2015-12-15 MED ORDER — IOHEXOL 300 MG/ML  SOLN
100.0000 mL | Freq: Once | INTRAMUSCULAR | Status: AC | PRN
  Administered 2015-12-15: 100 mL via INTRAVENOUS

## 2015-12-15 MED ORDER — ASPIRIN 81 MG PO CHEW
81.0000 mg | CHEWABLE_TABLET | Freq: Every day | ORAL | Status: DC
  Administered 2015-12-16: 81 mg via ORAL
  Filled 2015-12-15: qty 1

## 2015-12-15 MED ORDER — DIAZEPAM 5 MG/ML IJ SOLN
5.0000 mg | Freq: Once | INTRAMUSCULAR | Status: AC
  Administered 2015-12-15: 5 mg via INTRAVENOUS
  Filled 2015-12-15: qty 2

## 2015-12-15 NOTE — Progress Notes (Signed)
Pt arrived to room from ED with c/o of dizziness and vomiting, pt alert and oriented, denies any pain, pt settled in bed with call light at bedside, pt reassured, will continue to monitor. Obasogie-Asidi, Taleeyah Bora Efe

## 2015-12-15 NOTE — Consult Note (Signed)
Requesting Physician: Clayton Bibles, PA      Reason for consultation: Dizziness, abnormal brain MRI  HPI:                                                                                                                                         [name] [name] is an 65 y.o. female patient who presented with with episode of severe dizziness earlier this morning. She works night shifts and post office. When she was getting ready to walk her dog this morning, she experienced intense feeling of lightheadedness and presyncopal symptoms which persisted first several minutes. No other neurological symptoms at that time. She did not have loss of consciousness. Patient lives alone not sure if there was any altered mental status associated with this. She reportedly has been having similar episodes of lightheadedness and dizziness symptoms with past 2-3 weeks occurring couple of times daily.  At this time she is at her baseline, no neurological symptoms no vision speech problems or sensorimotor difficulties. Never had seizures or staring spells to her knowledge.  As part of her workup in the ER, she had an MRI of the brain done which showed an incidental small focal area of signal changes in the right lateral temporal lobe. Hence neurology is consulted for further evaluation.  Patient denies any significant medical problems no cancer history. She had a biopsy of the breast lesion that in the past which turned out to be benign. She does have a strong family history of breast cancer in her mother and maternal aunt.    Past Medical History: Past Medical History  Diagnosis Date  . Hypertension   . Varicose veins of lower extremities   . Hypercholesterolemia   . GERD (gastroesophageal reflux disease)   . Colonic polyp   . Headache(784.0)   . Anxiety   . Idiopathic urticaria     Past Surgical History  Procedure Laterality Date  . Vaginal hysterectomy    . Bilateral breast implants    . Benign breast biopsy      . Varicose vein    . Cataract right eye w/ lens implant    . Excision of large anal polyp    . Colonoscopy    . Polypectomy      Family History: Family History  Problem Relation Age of Onset  . Hypertension Mother   . Hyperlipidemia Mother   . Diabetes Mother   . Breast cancer Mother   . Pulmonary embolism Mother   . Deep vein thrombosis Mother   . Colon cancer Neg Hx   . Esophageal cancer Neg Hx   . Stomach cancer Neg Hx     Social History:   reports that she has never smoked. She does not have any smokeless tobacco history on file. She reports that she does not drink alcohol or use illicit drugs.  Allergies:  No Known Allergies  Medications:                                                                                                                        No current facility-administered medications for this encounter.  Current outpatient prescriptions:  .  aspirin 81 MG tablet, Take 81 mg by mouth daily.  , Disp: , Rfl:  .  Cholecalciferol (VITAMIN D-3) 1000 UNITS CAPS, Take 1 capsule by mouth daily., Disp: , Rfl:  .  estrogens, conjugated, (PREMARIN) 0.3 MG tablet, Take 0.3 mg by mouth daily. Take daily for 21 days then do not take for 7 days. , Disp: , Rfl:  .  losartan (COZAAR) 100 MG tablet, TAKE 1 TABLET (100 MG TOTAL) BY MOUTH DAILY., Disp: 90 tablet, Rfl: 2 .  LORazepam (ATIVAN) 0.5 MG tablet, Take 1/2 to 1 tablet by mouth up to 3 times a day as needed for nerves (Patient not taking: Reported on 12/15/2015), Disp: 90 tablet, Rfl: 5   ROS:                                                                                                                                       History obtained from the patient  General ROS: negative for - chills, fatigue, fever, night sweats, weight gain or weight loss Psychological ROS: negative for - behavioral disorder, hallucinations, memory difficulties, mood swings or suicidal ideation Ophthalmic ROS: negative for - blurry  vision, double vision, eye pain or loss of vision ENT ROS: negative for - epistaxis, nasal discharge, oral lesions, sore throat, tinnitus or vertigo Allergy and Immunology ROS: negative for - hives or itchy/watery eyes Hematological and Lymphatic ROS: negative for - bleeding problems, bruising or swollen lymph nodes Endocrine ROS: negative for - galactorrhea, hair pattern changes, polydipsia/polyuria or temperature intolerance Respiratory ROS: negative for - cough, hemoptysis, shortness of breath or wheezing Cardiovascular ROS: negative for - chest pain, dyspnea on exertion, edema or irregular heartbeat Gastrointestinal ROS: negative for - abdominal pain, diarrhea, hematemesis, nausea/vomiting or stool incontinence Genito-Urinary ROS: negative for - dysuria, hematuria, incontinence or urinary frequency/urgency Musculoskeletal ROS: negative for - joint swelling or muscular weakness Neurological ROS: as noted in HPI Dermatological ROS: negative for rash and skin lesion changes  Neurologic Examination:  Today's Vitals   12/15/15 1315 12/15/15 1345 12/15/15 1415 12/15/15 1500  BP: 142/71 158/77 152/69 157/76  Pulse: 79 83 82 85  Temp:      TempSrc:      Resp: 16 25 17 26   Height:      Weight:      SpO2: 98% 99% 98% 99%  PainSc:        Evaluation of higher integrative functions including: Level of alertness: Alert,  Oriented to time, place and person Recent and remote memory - intact   Attention span and concentration  - intact   Speech: fluent, no evidence of dysarthria or aphasia noted.  Test the following cranial nerves: 2-12 grossly intact Motor examination: Normal tone, bulk, full 5/5 motor strength in all 4 extremities Examination of sensation : Normal and symmetric sensation to pinprick in all 4 extremities and on face Examination of deep tendon reflexes: 2+, normal and symmetric in  all extremities, normal plantars bilaterally Test coordination: Normal finger nose testing, with no evidence of limb appendicular ataxia or abnormal involuntary movements or tremors noted.  Gait: Deferred     Lab Results: Basic Metabolic Panel:  Recent Labs Lab 12/15/15 1020  NA 138  K 3.4*  CL 102  CO2 26  GLUCOSE 119*  BUN 11  CREATININE 0.71  CALCIUM 9.0    Liver Function Tests:  Recent Labs Lab 12/15/15 1020  AST 42*  ALT 20  ALKPHOS 78  BILITOT 0.7  PROT 7.7  ALBUMIN 3.9   No results for input(s): LIPASE, AMYLASE in the last 168 hours. No results for input(s): AMMONIA in the last 168 hours.  CBC:  Recent Labs Lab 12/15/15 1020  WBC 5.5  NEUTROABS 3.8  HGB 12.9  HCT 38.5  MCV 86.5  PLT 266    Cardiac Enzymes: No results for input(s): CKTOTAL, CKMB, CKMBINDEX, TROPONINI in the last 168 hours.  Lipid Panel: No results for input(s): CHOL, TRIG, HDL, CHOLHDL, VLDL, LDLCALC in the last 168 hours.  CBG:  Recent Labs Lab 12/15/15 1018  GLUCAP 116*    Microbiology: No results found for this or any previous visit.   Imaging: Ct Head Wo Contrast  12/15/2015  CLINICAL DATA:  Fatigue, dizziness, headache starting this morning EXAM: CT HEAD WITHOUT CONTRAST TECHNIQUE: Contiguous axial images were obtained from the base of the skull through the vertex without intravenous contrast. COMPARISON:  None. FINDINGS: Brain: No intracranial hemorrhage, mass effect or midline shift. No acute cortical infarction. No hydrocephalus. No intra or extra-axial fluid collection. No mass lesion is noted on this unenhanced scan. Vascular: No hyperdense vessel or unexpected calcification. Skull: Negative for fracture or focal lesion. Sinuses/Orbits: No acute findings. Other: None. IMPRESSION: No acute intracranial abnormality. No definite acute cortical infarction. Electronically Signed   By: Lahoma Crocker M.D.   On: 12/15/2015 12:42   Mr Brain Wo Contrast (neuro  Protocol)  12/15/2015  CLINICAL DATA:  Sudden onset severe dizziness with vomiting. Near syncope. Hypertensive. EXAM: MRI HEAD WITHOUT CONTRAST MRA HEAD WITHOUT CONTRAST TECHNIQUE: Multiplanar, multiecho pulse sequences of the brain and surrounding structures were obtained without intravenous contrast. Angiographic images of the head were obtained using MRA technique without contrast. COMPARISON:  Head CT 12/15/2015 and MRI 12/10/2005 FINDINGS: MRI HEAD FINDINGS There is no evidence of acute infarct, intracranial hemorrhage, midline shift, or extra-axial fluid collection. Ventricles and sulci are normal. There are a few punctate foci of T2 hyperintensity in the subcortical cerebral white matter which are nonspecific and  not greater than expected for patient's age. Additionally there is a 7 mm oval T2 hyperintense focus in the anterior right temporal lobe which appears to involve cortex and is new from the prior MRI. There is a tiny, chronic infarct in the left cerebellum which is new from the prior MRI. Prior right cataract extraction is noted. Paranasal sinuses and mastoid air cells are clear. Major intracranial vascular flow voids are preserved. MRA HEAD FINDINGS The visualized distal vertebral arteries are patent and codominant. PICA, AICA, and SCA origins are patent. Basilar artery is patent without stenosis. There is likely a tiny right posterior communicating artery. PCAs are patent without evidence of significant proximal stenosis. The internal carotid arteries are patent from skullbase to carotid termini without evidence of stenosis. The cavernous segments are mildly tortuous bilaterally. ACAs and MCAs are patent without evidence of significant proximal stenosis or major branch occlusion. Anterior communicating artery is present. No intracranial aneurysm is identified. IMPRESSION: 1. No acute infarct. 2. Tiny chronic left cerebellar infarct. 3. New 7 mm T2 hyperintense lesion involving anterior right  temporal lobe cortex, indeterminate. Postcontrast imaging is recommended to assess for small neoplasm. 4. No major intracranial arterial occlusion or significant stenosis. Electronically Signed   By: Logan Bores M.D.   On: 12/15/2015 16:41   Mr Virgel Paling (cerebral Arteries)  12/15/2015  CLINICAL DATA:  Sudden onset severe dizziness with vomiting. Near syncope. Hypertensive. EXAM: MRI HEAD WITHOUT CONTRAST MRA HEAD WITHOUT CONTRAST TECHNIQUE: Multiplanar, multiecho pulse sequences of the brain and surrounding structures were obtained without intravenous contrast. Angiographic images of the head were obtained using MRA technique without contrast. COMPARISON:  Head CT 12/15/2015 and MRI 12/10/2005 FINDINGS: MRI HEAD FINDINGS There is no evidence of acute infarct, intracranial hemorrhage, midline shift, or extra-axial fluid collection. Ventricles and sulci are normal. There are a few punctate foci of T2 hyperintensity in the subcortical cerebral white matter which are nonspecific and not greater than expected for patient's age. Additionally there is a 7 mm oval T2 hyperintense focus in the anterior right temporal lobe which appears to involve cortex and is new from the prior MRI. There is a tiny, chronic infarct in the left cerebellum which is new from the prior MRI. Prior right cataract extraction is noted. Paranasal sinuses and mastoid air cells are clear. Major intracranial vascular flow voids are preserved. MRA HEAD FINDINGS The visualized distal vertebral arteries are patent and codominant. PICA, AICA, and SCA origins are patent. Basilar artery is patent without stenosis. There is likely a tiny right posterior communicating artery. PCAs are patent without evidence of significant proximal stenosis. The internal carotid arteries are patent from skullbase to carotid termini without evidence of stenosis. The cavernous segments are mildly tortuous bilaterally. ACAs and MCAs are patent without evidence of significant  proximal stenosis or major branch occlusion. Anterior communicating artery is present. No intracranial aneurysm is identified. IMPRESSION: 1. No acute infarct. 2. Tiny chronic left cerebellar infarct. 3. New 7 mm T2 hyperintense lesion involving anterior right temporal lobe cortex, indeterminate. Postcontrast imaging is recommended to assess for small neoplasm. 4. No major intracranial arterial occlusion or significant stenosis. Electronically Signed   By: Logan Bores M.D.   On: 12/15/2015 16:41      Assessment and plan:   [name] [name] is an 65 y.o. female patient who presented for evaluation of a acute episode of persistent dizziness and lightheaded feeling status morning. She has been having the similar episodes to mild extent, left times a  day for the past 2-3 weeks. No other neurological symptoms. Her neurological examination is nonfocal. Incidental small focal area of signal change is seen in the noncontrast MRI done as part of her workup in the ER in the right lateral temporal lobe.  I ordered MRI of the brain with contrast for further evaluation of this lesion. Small intensely enhancing dural based lesion is seen in the right lateral temporal lobe. The differential certainly includes a benign meningioma based on its appearance. She had a brain MRI in 2007 which did not show any evidence of this enhancing lesion, which appears to be greater than usual. Given the strong family history of breast cancer in her mother and maternal aunt, I would consider a dural based metastatic lesion in the differential. Hence recommend further workup with a CT of the chest, abdomen and pelvis. ER provider will speak to neurosurgery as well to review MRI images for their input.  Based on the location of this lesion, in theory, it can be epileptogenic. The episodic dizziness and lightheaded symptoms she has been describing could potentially represent epileptiform in nature. I recommend starting Keppra with a loading  dose of 1 g IV given now in the ER followed by maintenance dose of 500 mg po twice a day starting tomorrow morning, EEG to be done tomorrow morning.   She'll be admitted to hospitalist service for overnight monitoring and completion of the workup. We'll have cardiac telemetry monitoring to rule out arrhythmias contributing to the dizziness / lightheaded symptoms.  We'll follow-up.

## 2015-12-15 NOTE — ED Notes (Signed)
Patient transported to MRI 

## 2015-12-15 NOTE — H&P (Signed)
History and Physical  Patient Name: [name] [name]     WUJ:[id number]    DOB: Oct 03, 1950    DOA: 12/15/2015 Referring physician: Trixie Dredge PA-C PCP: Michele Mcalpine, MD      Chief Complaint: Dizziness  HPI: [name] [name] is a 65 y.o. female with a past medical history significant for HTN who presents with dizziness.  The patient was in her usual state of health until a few weeks ago when she started to notice dizziness while walking her dog.  This was a sensation of being "swimmy headed", which she noticed while walking, would stop for a few seconds and then it would go away.    Last night, she worked overnight as usual, came home and was going to get the dog out when she felt a more intense diziness than before, like the "room was swaying, not spinning."  This was so intense that she called 9-1-1 and she was headed to the backdoor to unlock it before they came, when she got so weak she collapsed.  There was no headache, focal weakness, speech disturbance, and she doesn't think she lost consciousness.  This has never happened before.  In the ED, she was afebrile, hypertensive. Na 138, K 3.4, Cr 0.7, AST 42, WBC 5K, Hgb 12.9, TNI normal, UA clear.  A CT of the head was normal, and MRI brain showed a small 7mm T2 enhancing lesion in the R temporal lobe, consistent with a meningioma or a dural metastasis.    Neurology were consulted who felt the patient's dizziness maybe be epileptiform/seizure, started Keppra, recommended EEG and overnight observation, and also recommended that ruling out metastasis (given patient's family history of breast cancer in mother and sister) was necessary.    Review of Systems:  Pt complains of dizziness, weakness, collapse. Pt denies any confusion, LOC, seizure, headache, weight loss, breast mass.  All other systems negative except as just noted or noted in the history of present illness.  No Known Allergies  Prior to Admission medications   Medication Sig  Start Date End Date Taking? Authorizing Provider  aspirin 81 MG tablet Take 81 mg by mouth daily.     Yes Historical Provider, MD  Cholecalciferol (VITAMIN D-3) 1000 UNITS CAPS Take 1 capsule by mouth daily.   Yes Historical Provider, MD  estrogens, conjugated, (PREMARIN) 0.3 MG tablet Take 0.3 mg by mouth daily. Take daily for 21 days then do not take for 7 days.    Yes Historical Provider, MD  losartan (COZAAR) 100 MG tablet TAKE 1 TABLET (100 MG TOTAL) BY MOUTH DAILY. 05/26/15  Yes Michele Mcalpine, MD  LORazepam (ATIVAN) 0.5 MG tablet Take 1/2 to 1 tablet by mouth up to 3 times a day as needed for nerves Patient not taking: Reported on 12/15/2015 07/28/14   Michele Mcalpine, MD    Past Medical History  Diagnosis Date  . Hypertension   . Varicose veins of lower extremities   . Hypercholesterolemia   . GERD (gastroesophageal reflux disease)   . Colonic polyp   . Headache(784.0)   . Anxiety   . Idiopathic urticaria     Past Surgical History  Procedure Laterality Date  . Vaginal hysterectomy    . Bilateral breast implants    . Benign breast biopsy    . Varicose vein    . Cataract right eye w/ lens implant    . Excision of large anal polyp    . Colonoscopy    . Polypectomy  Family history: family history includes Breast cancer in her mother; Deep vein thrombosis in her mother; Dementia in her father; Diabetes in her mother; Hyperlipidemia in her mother; Hypertension in her mother; Pulmonary embolism in her mother; Sickle cell anemia in her cousin. There is no history of Colon cancer, Esophageal cancer, or Stomach cancer.  Social History: Patient lives alone with her dog, [place].  She does not smoke.  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  She is from [place].      Physical Exam: BP 176/99 mmHg  Pulse 79  Temp(Src) 98.2 F (36.8 C) (Axillary)  Resp 18  Ht 5\' 2"  (1.575 m)  Wt 58.06 kg (128 lb)  BMI 23.41 kg/m2  SpO2 100% General appearance: Thin adult female, alert and in  no acute distress.   Eyes: Anicteric, conjunctiva pink, lids and lashes normal.     ENT: No nasal deformity, discharge, or epistaxis.  OP moist without lesions.   Lymph: No axillary, cervical or supraclavicular lymphadenopathy. Skin: Warm and dry.  No suspicious rashes or lesions. Breast: Deferred Cardiac: RRR, nl S1-S2, no murmurs appreciated.  Capillary refill is brisk.  No LE edema.  Radial and DP pulses 2+ and symmetric. Respiratory: Normal respiratory rate and rhythm.  No rales.  Rare expiratory high pitched wheezes, clear with cough. Abdomen: Abdomen soft without rigidity.  No TTP. No ascites, distension.   MSK: No deformities or effusions. Neuro: Pupils are 3 mm and reactive to 2 mm.  Extraocular movements are intact, without nystagmus.  Cranial nerve 5 is within normal limits.  Cranial nerve 7 is symmetrical.  Cranial nerve 8 is within normal limits.  Cranial nerves 9 and 10 reveal equal palate elevation.  Cranial nerve 11 reveals sternocleidomastoid strong.  Cranial nerve 12 is midline.  Motor strength testing is 5/5 in the upper and lower extremities bilaterally with normal motor, tone and bulk. Sensory examination is intact to light touch and position.  Romberg maneuver is negative for pathology.  Finger-to-nose testing is within normal limits.  The patient is oriented to time, place and person.  Speech is fluent.  Naming is grossly intact.  Recall, recent and remote, as well as general fund of knowledge seem within normal limits.  Attention span and concentration are within normal limits. Psych: Behavior appropriate.  Affect normal.  No evidence of aural or visual hallucinations or delusions.       Labs on Admission:  The metabolic panel shows normal electrolytes and renal function. Minimally elevated AST. TNI normal. UA clear. The complete blood count shows no leukocytosis, anemia or thrombocytopenia.   Radiological Exams on Admission: Personally reviewed: Ct Head Wo  Contrast  12/15/2015  CLINICAL DATA:  Fatigue, dizziness, headache starting this morning EXAM: CT HEAD WITHOUT CONTRAST TECHNIQUE: Contiguous axial images were obtained from the base of the skull through the vertex without intravenous contrast. COMPARISON:  None. FINDINGS: Brain: No intracranial hemorrhage, mass effect or midline shift. No acute cortical infarction. No hydrocephalus. No intra or extra-axial fluid collection. No mass lesion is noted on this unenhanced scan. Vascular: No hyperdense vessel or unexpected calcification. Skull: Negative for fracture or focal lesion. Sinuses/Orbits: No acute findings. Other: None. IMPRESSION: No acute intracranial abnormality. No definite acute cortical infarction. Electronically Signed   By: Natasha Mead M.D.   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 Clinical request to evaluate for metastatic disease. EXAM: CT CHEST, ABDOMEN, AND PELVIS WITH CONTRAST TECHNIQUE: Multidetector CT imaging of the chest, abdomen and pelvis was performed following the standard protocol during bolus administration of intravenous contrast. CONTRAST:  OMNIPAQUE IOHEXOL 300 MG/ML  SOLN COMPARISON:  No prior CT of the chest, abdomen or pelvis. 07/28/2014 chest radiograph. FINDINGS: CT CHEST Mediastinum/Nodes: Normal heart size. Trace pericardial fluid/thickening. Great vessels are normal in course and caliber. No central pulmonary emboli. Normal visualized thyroid. Normal esophagus. No pathologically enlarged axillary, mediastinal or hilar lymph nodes. Lungs/Pleura: No pneumothorax. No pleural effusion. Minimal tree-in-bud opacity in the right middle lobe without appreciable bronchiectasis. Mild osteophyte related fibrosis in the  medial right lower lobe. No acute consolidative airspace disease, significant pulmonary nodules or lung masses. Musculoskeletal: There are a few small non expansile sclerotic foci scattered throughout the sternum. There is patchy increased sclerosis in the T5 vertebral body. Increased sclerosis in the superior T12 endplate is probably degenerative. Mild degenerative changes are present in the thoracic spine. Intact appearing bilateral breast prostheses. CT ABDOMEN AND PELVIS Hepatobiliary: Normal liver with no liver mass. Normal gallbladder with no radiopaque cholelithiasis. No biliary ductal dilatation. Pancreas: Normal, with no mass or duct dilation. Spleen: Normal size. No mass. Adrenals/Urinary Tract: Normal adrenals. Normal kidneys with no hydronephrosis and no renal mass. Normal bladder. Stomach/Bowel: Grossly normal stomach. Normal caliber small bowel with no small bowel wall thickening. Normal appendix. Mild sigmoid diverticulosis, with no large bowel wall thickening or pericolonic fat stranding. Vascular/Lymphatic: Normal caliber abdominal aorta. Patent portal, splenic and renal veins. No pathologically enlarged lymph nodes in the abdomen or pelvis. Reproductive: Status post hysterectomy, with no abnormal findings at the vaginal cuff. No adnexal mass. Other: No pneumoperitoneum, ascites or focal fluid collection. Musculoskeletal: Subcentimeter sclerotic focus in the left L4 vertebral body, nonspecific. No additional focal osseous lesions in the abdomen or pelvis. IMPRESSION: 1. Nonspecific small sclerotic foci in the sternum and T5 and L4 vertebral bodies. Consider correlation with a bone scintigraphy study on a short term outpatient basis. 2. Otherwise no potential sites of metastatic disease in the chest, abdomen or pelvis. 3. Minimal tree-in-bud opacity in the right middle lobe without bronchiectasis, favoring a minimal infectious or inflammatory bronchiolitis of doubtful clinical significance. 4. Mild  sigmoid diverticulosis. Electronically Signed   By: Delbert Phenix M.D.   On: 12/15/2015 21:00   Mr Brain Wo Contrast (neuro Protocol)  12/15/2015  CLINICAL DATA:  Sudden onset severe dizziness with vomiting. Near syncope. Hypertensive. EXAM: MRI HEAD WITHOUT CONTRAST MRA HEAD WITHOUT CONTRAST TECHNIQUE: Multiplanar, multiecho pulse sequences of the brain and surrounding structures were obtained without intravenous contrast. Angiographic images of the head were obtained using MRA technique without contrast. COMPARISON:  Head CT 12/15/2015 and MRI 12/10/2005 FINDINGS: MRI HEAD FINDINGS There is no evidence of acute infarct, intracranial hemorrhage, midline shift, or extra-axial fluid collection. Ventricles and sulci are normal. There are a few punctate foci of T2 hyperintensity in the subcortical cerebral white matter which are nonspecific and not greater than expected for patient's age. Additionally there is a 7 mm oval T2 hyperintense focus in the anterior right temporal lobe which appears to involve cortex and is new from the prior MRI. There is a tiny, chronic infarct in the left cerebellum which is new from the prior MRI. Prior right cataract extraction is noted. Paranasal sinuses and mastoid air cells are clear.  Major intracranial vascular flow voids are preserved. MRA HEAD FINDINGS The visualized distal vertebral arteries are patent and codominant. PICA, AICA, and SCA origins are patent. Basilar artery is patent without stenosis. There is likely a tiny right posterior communicating artery. PCAs are patent without evidence of significant proximal stenosis. The internal carotid arteries are patent from skullbase to carotid termini without evidence of stenosis. The cavernous segments are mildly tortuous bilaterally. ACAs and MCAs are patent without evidence of significant proximal stenosis or major branch occlusion. Anterior communicating artery is present. No intracranial aneurysm is identified. IMPRESSION: 1.  No acute infarct. 2. Tiny chronic left cerebellar infarct. 3. New 7 mm T2 hyperintense lesion involving anterior right temporal lobe cortex, indeterminate. Postcontrast imaging is recommended to assess for small neoplasm. 4. No major intracranial arterial occlusion or significant stenosis. Electronically Signed   By: Sebastian Ache M.D.   On: 12/15/2015 16:41   Mr Laqueta Jean Contrast  12/15/2015  CLINICAL DATA:  Sudden onset of severe dizziness with vomiting. EXAM: MRI HEAD WITH CONTRAST TECHNIQUE: Multiplanar, multiecho pulse sequences of the brain and surrounding structures were obtained with intravenous contrast. COMPARISON:  None. CONTRAST:  10mL MULTIHANCE GADOBENATE DIMEGLUMINE 529 MG/ML IV SOLN FINDINGS: Post infusion, 5 x 7 x 5 mm enhancing lesion along the floor of the RIGHT middle cranial fossa is demonstrated, extra-axial, small dural tail, consistent with a meningioma. No other similar lesions. 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 Electronically Signed   By: Elsie Stain M.D.   On: 12/15/2015 19:29   Ct Abdomen Pelvis W Contrast  12/15/2015  CLINICAL DATA:  65 year old female with indeterminate anterior right temporal lobe lesion on noncontrast brain MRI, which was described as a subcentimeter meningioma at the floor of the right middle cranial fossa on subsequent post-contrast MRI. Clinical request to evaluate for metastatic disease. EXAM: CT CHEST, ABDOMEN, AND PELVIS WITH CONTRAST TECHNIQUE: Multidetector CT imaging of the chest, abdomen and pelvis was performed following the standard protocol during bolus administration of intravenous contrast. CONTRAST:  OMNIPAQUE IOHEXOL 300 MG/ML  SOLN COMPARISON:  No prior CT of the chest, abdomen or pelvis. 07/28/2014 chest radiograph. FINDINGS: CT CHEST Mediastinum/Nodes: Normal heart size. Trace pericardial fluid/thickening. Great vessels are normal in course and caliber. No central  pulmonary emboli. Normal visualized thyroid. Normal esophagus. No pathologically enlarged axillary, mediastinal or hilar lymph nodes. Lungs/Pleura: No pneumothorax. No pleural effusion. Minimal tree-in-bud opacity in the right middle lobe without appreciable bronchiectasis. Mild osteophyte related fibrosis in the medial right lower lobe. No acute consolidative airspace disease, significant pulmonary nodules or lung masses. Musculoskeletal: There are a few small non expansile sclerotic foci scattered throughout the sternum. There is patchy increased sclerosis in the T5 vertebral body. Increased sclerosis in the superior T12 endplate is probably degenerative. Mild degenerative changes are present in the thoracic spine. Intact appearing bilateral breast prostheses. CT ABDOMEN AND PELVIS Hepatobiliary: Normal liver with no liver mass. Normal gallbladder with no radiopaque cholelithiasis. No biliary ductal dilatation. Pancreas: Normal, with no mass or duct dilation. Spleen: Normal size. No mass. Adrenals/Urinary Tract: Normal adrenals. Normal kidneys with no hydronephrosis and no renal mass. Normal bladder. Stomach/Bowel: Grossly normal stomach. Normal caliber small bowel with no small bowel wall thickening. Normal appendix. Mild sigmoid diverticulosis, with no large bowel wall thickening or pericolonic fat stranding. Vascular/Lymphatic: Normal caliber abdominal aorta. Patent portal, splenic and renal veins. No pathologically enlarged lymph nodes in the abdomen or pelvis. Reproductive: Status  post hysterectomy, with no abnormal findings at the vaginal cuff. No adnexal mass. Other: No pneumoperitoneum, ascites or focal fluid collection. Musculoskeletal: Subcentimeter sclerotic focus in the left L4 vertebral body, nonspecific. No additional focal osseous lesions in the abdomen or pelvis. IMPRESSION: 1. Nonspecific small sclerotic foci in the sternum and T5 and L4 vertebral bodies. Consider correlation with a bone  scintigraphy study on a short term outpatient basis. 2. Otherwise no potential sites of metastatic disease in the chest, abdomen or pelvis. 3. Minimal tree-in-bud opacity in the right middle lobe without bronchiectasis, favoring a minimal infectious or inflammatory bronchiolitis of doubtful clinical significance. 4. Mild sigmoid diverticulosis. Electronically Signed   By: Delbert Phenix M.D.   On: 12/15/2015 21:00   Mr Shirlee Latch (cerebral Arteries)  12/15/2015  CLINICAL DATA:  Sudden onset severe dizziness with vomiting. Near syncope. Hypertensive. EXAM: MRI HEAD WITHOUT CONTRAST MRA HEAD WITHOUT CONTRAST TECHNIQUE: Multiplanar, multiecho pulse sequences of the brain and surrounding structures were obtained without intravenous contrast. Angiographic images of the head were obtained using MRA technique without contrast. COMPARISON:  Head CT 12/15/2015 and MRI 12/10/2005 FINDINGS: MRI HEAD FINDINGS There is no evidence of acute infarct, intracranial hemorrhage, midline shift, or extra-axial fluid collection. Ventricles and sulci are normal. There are a few punctate foci of T2 hyperintensity in the subcortical cerebral white matter which are nonspecific and not greater than expected for patient's age. Additionally there is a 7 mm oval T2 hyperintense focus in the anterior right temporal lobe which appears to involve cortex and is new from the prior MRI. There is a tiny, chronic infarct in the left cerebellum which is new from the prior MRI. Prior right cataract extraction is noted. Paranasal sinuses and mastoid air cells are clear. Major intracranial vascular flow voids are preserved. MRA HEAD FINDINGS The visualized distal vertebral arteries are patent and codominant. PICA, AICA, and SCA origins are patent. Basilar artery is patent without stenosis. There is likely a tiny right posterior communicating artery. PCAs are patent without evidence of significant proximal stenosis. The internal carotid arteries are patent  from skullbase to carotid termini without evidence of stenosis. The cavernous segments are mildly tortuous bilaterally. ACAs and MCAs are patent without evidence of significant proximal stenosis or major branch occlusion. Anterior communicating artery is present. No intracranial aneurysm is identified. IMPRESSION: 1. No acute infarct. 2. Tiny chronic left cerebellar infarct. 3. New 7 mm T2 hyperintense lesion involving anterior right temporal lobe cortex, indeterminate. Postcontrast imaging is recommended to assess for small neoplasm. 4. No major intracranial arterial occlusion or significant stenosis. Electronically Signed   By: Sebastian Ache M.D.   On: 12/15/2015 16:41    EKG: Independently reviewed. Rate 80, QTc 442, no ischemia.    Assessment/Plan 1. Brain lesion and dizziness:  This is new.  The lesion appears to have the appearance of a dural lesion, meningioma versus dural metastasis.  CT abdomen pelvis and chest shows only non-specific sclerotic lesions of the sternum and vertebrae, no breast mass or lymphadenopathy.   -Consult to Neurology, appreciate cares -levetiracetam load now, then 500 mg PO BID starting tomorrow -EEG tomorrow morning -Ondansetron as necessary for nausea -CT imaging shows non-specific sclerotic lesions of sternum and vertebrae, follow up imaging with bone scintigraphy as outpatient -Mammogram as outpatient -Follow up with Neurosurgery, Dr. Bevely Palmer in 4 weeks   2. HTN:  Hypertensive at admission. -Continue home losartan      DVT PPx: Outpatient status, low risk Diet: Regular Consultants: Neurology Code  Status: FULL Family Communication: None present  Medical decision making: What exists of the patient's previous chart was reviewed in depth and the case was discussed with Trixie Dredge, PA-C. Patient seen 10:36 PM on 12/15/2015.  Disposition Plan:  I recommend admission to med surg bed, observation status.  Clinical condition: stable.  Anticipate EEG in AM,  discharge with bone scintigraphy, mammogram, Neurosurgery in 4 weeks, and PCP follow up.  Other disposition per Neurology.      Alberteen Sam [facility] Pager [phone]

## 2015-12-15 NOTE — ED Provider Notes (Signed)
CSN: [id number]     Arrival date & time 12/15/15  [id number] History   First MD Initiated Contact with Patient 12/15/15 1005     Chief Complaint  Patient presents with  . Dizziness     (Consider location/radiation/quality/duration/timing/severity/associated sxs/prior Treatment) HPI   Pt with hx HTN, HLD, possible hx vertigo p/w intermittent lightheadedness and near syncope that has come in waves for the past two weeks.  It lasts for less than 1 minute and has happened 2-3 x per week.  This morning she worked all night as usual, came home and changed, was planning to walk the dog when she developed the lightheadedness and it did not resolve.  She was unable to get up to walk the dog for fear of falling.  The lightheadedness also feels like a pressure at her temples.  On the way to the hospital she developed spinning, N/V.  She has been diagnosed with vertigo previously but states she thought she actually had food poisoning.  Denies chest pain, SOB, palpitations, leg swelling, abdominal pain.  Has had sick contacts as her parents have been sick and hospitalized and she has been in and out of the hospital.  For the same reason she has not been sleeping well.  She has been eating and drinking normally, denies medication changes, denies recent travel, or recent illness.    Past Medical History  Diagnosis Date  . Hypertension   . Varicose veins of lower extremities   . Hypercholesterolemia   . GERD (gastroesophageal reflux disease)   . Colonic polyp   . Headache(784.0)   . Anxiety   . Idiopathic urticaria    Past Surgical History  Procedure Laterality Date  . Vaginal hysterectomy    . Bilateral breast implants    . Benign breast biopsy    . Varicose vein    . Cataract right eye w/ lens implant    . Excision of large anal polyp    . Colonoscopy    . Polypectomy     Family History  Problem Relation Age of Onset  . Hypertension Mother   . Hyperlipidemia Mother   . Diabetes Mother   . Breast  cancer Mother   . Pulmonary embolism Mother   . Deep vein thrombosis Mother   . Colon cancer Neg Hx   . Esophageal cancer Neg Hx   . Stomach cancer Neg Hx    Social History  Substance Use Topics  . Smoking status: Never Smoker   . Smokeless tobacco: None  . Alcohol Use: No   OB History    No data available     Review of Systems  All other systems reviewed and are negative.     Allergies  Review of patient's allergies indicates no known allergies.  Home Medications   Prior to Admission medications   Medication Sig Start Date End Date Taking? Authorizing Provider  aspirin 81 MG tablet Take 81 mg by mouth daily.      Historical Provider, MD  Cholecalciferol (VITAMIN D-3) 1000 UNITS CAPS Take 1 capsule by mouth daily.    Historical Provider, MD  estrogens, conjugated, (PREMARIN) 0.3 MG tablet Take 0.3 mg by mouth daily. Take daily for 21 days then do not take for 7 days.     Historical Provider, MD  GARCINIA CAMBOGIA-CHROMIUM PO Take 2 tablets by mouth 2 (two) times daily.    Historical Provider, MD  LORazepam (ATIVAN) 0.5 MG tablet Take 1/2 to 1 tablet by mouth up to 3  times a day as needed for nerves 07/28/14   Noralee Space, MD  losartan (COZAAR) 100 MG tablet TAKE 1 TABLET (100 MG TOTAL) BY MOUTH DAILY. 05/26/15   Noralee Space, MD   BP 183/86 mmHg  Pulse 81  Temp(Src) 97.7 F (36.5 C) (Oral)  Resp 9  Ht 5\' 2"  (1.575 m)  Wt 58.06 kg  BMI 23.41 kg/m2  SpO2 99% Physical Exam  Constitutional: She appears well-developed and well-nourished. No distress.  HENT:  Head: Normocephalic and atraumatic.  Neck: Neck supple.  Cardiovascular: Normal rate and regular rhythm.   Pulmonary/Chest: Effort normal and breath sounds normal. No respiratory distress. She has no wheezes. She has no rales.  Abdominal: Soft. She exhibits no distension. There is no tenderness. There is no rebound and no guarding.  Musculoskeletal: She exhibits no edema.  Neurological: She is alert. She exhibits  normal muscle tone.  CN II-XII intact, EOMs intact,no pronator drift, grip strengths equal bilaterally; strength 5/5 in all extremities, sensation intact in all extremities; finger to nose, heel to shin, rapid alternating movements normal.  Gait testing deferred as pt dizzy.  Pt able to sit upright without assistance or difficulty.     Skin: She is not diaphoretic.  Psychiatric: She has a normal mood and affect. Her behavior is normal.  Nursing note and vitals reviewed. Movement of head with CN testing prompted vomiting.    ED Course  Procedures (including critical care time) Labs Review Labs Reviewed  COMPREHENSIVE METABOLIC PANEL - Abnormal; Notable for the following:    Potassium 3.4 (*)    Glucose, Bld 119 (*)    AST 42 (*)    All other components within normal limits  URINALYSIS, ROUTINE W REFLEX MICROSCOPIC (NOT AT [facility]) - Abnormal; Notable for the following:    Hgb urine dipstick TRACE (*)    All other components within normal limits  URINE MICROSCOPIC-ADD ON - Abnormal; Notable for the following:    Squamous Epithelial / LPF 0-5 (*)    Bacteria, UA RARE (*)    All other components within normal limits  CBG MONITORING, ED - Abnormal; Notable for the following:    Glucose-Capillary 116 (*)    All other components within normal limits  URINE CULTURE  CBC WITH DIFFERENTIAL/PLATELET  I-STAT TROPOININ, ED    Imaging Review Ct Head Wo Contrast  12/15/2015  CLINICAL DATA:  Fatigue, dizziness, headache starting this morning EXAM: CT HEAD WITHOUT CONTRAST TECHNIQUE: Contiguous axial images were obtained from the base of the skull through the vertex without intravenous contrast. COMPARISON:  None. FINDINGS: Brain: No intracranial hemorrhage, mass effect or midline shift. No acute cortical infarction. No hydrocephalus. No intra or extra-axial fluid collection. No mass lesion is noted on this unenhanced scan. Vascular: No hyperdense vessel or unexpected calcification. Skull: Negative for  fracture or focal lesion. Sinuses/Orbits: No acute findings. Other: None. IMPRESSION: No acute intracranial abnormality. No definite acute cortical infarction. Electronically Signed   By: Lahoma Crocker M.D.   On: 12/15/2015 12:42   I have personally reviewed and evaluated these images and lab results as part of my medical decision-making.   EKG Interpretation   Date/Time:  Tuesday December 15 2015 09:56:17 EDT Ventricular Rate:  80 PR Interval:  205 QRS Duration: 86 QT Interval:  383 QTC Calculation: 442 R Axis:   79 Text Interpretation:  Sinus rhythm LAE, consider biatrial enlargement  Probable left ventricular hypertrophy Borderline T abnormalities, inferior  leads agree. lateral ST depression slightly  greater than old. Confirmed by  Johnney Killian, MD, Jeannie Done [phone]) on 12/15/2015 12:03:37 PM       12:51 PM Pt with persistent dizziness, also feeling cold.    1:00 PM Discussed pt with Dr Johnney Killian who will also see the patient.    5:17 PM Pt reports she is feeling tired, not dizzy or nauseated.  Discussed pt with Dr Silverio Decamp who has reviewed the images and will see the patient.    5:20 PM I have spoken with Dr Cyndy Freeze (neurosurgery) who recommends sending patient back for MRI with contrast as recommended by the radiologist.  States pt can follow up with him in the office or with neurology in 4 weeks and will need repeat MRI.    7:22 PM Dr Silverio Decamp has seen the patient.  Recommends medical admission and consult to neurosurgery to request advice on newly seen enhancing lesion on brain MRI.  Also recommends adding CT chest/abdomen/pelvis given the possibility of metastatic disease.  Concern patient's symptoms may be related to seizures.  Please see his note for further details.    7:35 PM I spoke again with Dr Cyndy Freeze who will review the new images.  Anticipates re-imaging and follow up in 4 weeks unless this is indeed a metastatic lesion., and it will therefore be treated as indicated.  He will call me  back if his original plan of follow up in 4 weeks with reimaging changes.  8:09 PM Dr Cyndy Freeze has not called back, pt to follow up with him in 4 weeks.     MDM   Final diagnoses:  Dizziness  Chronic arterial ischemic stroke  Meningioma (HCC)    Pt with HTN, HLD p/w episodes of lightheadedness/near syncope, today with dizziness and vomiting.  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 Labs unremarkable.  Pt seen in ED by neurology (Dr Silverio Decamp) who advises patient should be admitted, worked up for possible metastatic cancer, also workup for possible seizure.  Please see Dr Woodward Ku note for further details.  Admitted to [facility], Dr Loleta Books accepting.      Clayton Bibles, PA-C 12/15/15 2013  Charlesetta Shanks, MD 12/16/15 [phone]

## 2015-12-15 NOTE — ED Notes (Signed)
Pt had a sudden onset of severe dizziness with vomiting. Pt has had 8 episodes of emesis with [facility]. Pt has a hx of vertigo and states this feels different. Pt received 8mg  of zofran PTA with no relief. Upon [facility] arrival pt was htn at 220/120. Pt is compliant with htn meds, had her meds this morning. Pt was found sitting on her steps when [facility] arrived.

## 2015-12-15 NOTE — ED Notes (Signed)
Patient transported to CT 

## 2015-12-16 ENCOUNTER — Observation Stay (HOSPITAL_BASED_OUTPATIENT_CLINIC_OR_DEPARTMENT_OTHER)
Admit: 2015-12-16 | Discharge: 2015-12-16 | Disposition: A | Discharge status: Home / Self Care | Payer: Federal, State, Local not specified - PPO | Attending: Family Medicine | Admitting: Family Medicine

## 2015-12-16 DIAGNOSIS — R42 Dizziness and giddiness: Secondary | ICD-10-CM

## 2015-12-16 DIAGNOSIS — R55 Syncope and collapse: Secondary | ICD-10-CM

## 2015-12-16 DIAGNOSIS — R9089 Other abnormal findings on diagnostic imaging of central nervous system: Secondary | ICD-10-CM | POA: Insufficient documentation

## 2015-12-16 DIAGNOSIS — D329 Benign neoplasm of meninges, unspecified: Secondary | ICD-10-CM

## 2015-12-16 DIAGNOSIS — I6930 Unspecified sequelae of cerebral infarction: Secondary | ICD-10-CM | POA: Insufficient documentation

## 2015-12-16 DIAGNOSIS — Z8673 Personal history of transient ischemic attack (TIA), and cerebral infarction without residual deficits: Secondary | ICD-10-CM | POA: Diagnosis not present

## 2015-12-16 DIAGNOSIS — R930 Abnormal findings on diagnostic imaging of skull and head, not elsewhere classified: Secondary | ICD-10-CM | POA: Diagnosis not present

## 2015-12-16 DIAGNOSIS — G939 Disorder of brain, unspecified: Secondary | ICD-10-CM | POA: Diagnosis not present

## 2015-12-16 MED ORDER — LEVETIRACETAM 500 MG PO TABS: 500.0000 mg | ORAL_TABLET | Freq: Two times a day (BID) | ORAL | Status: DC

## 2015-12-16 NOTE — Progress Notes (Signed)
laying

## 2015-12-16 NOTE — Care Management Note (Signed)
Case Management Note  Patient Details  Name: [name] [name] MRN: [id number] Date of Birth: [birth year]  Subjective/Objective:                    Action/Plan: Patient presented with dizziness. Lives at home alone. Will follow for discharge needs pending PT/OT evals and physician orders.  Expected Discharge Date:                  Expected Discharge Plan:     In-House Referral:     Discharge planning Services     Post Acute Care Choice:    Choice offered to:     DME Arranged:    DME Agency:     HH Arranged:    HH Agency:     Status of Service:  In process, will continue to follow  Medicare Important Message Given:    Date Medicare IM Given:    Medicare IM give by:    Date Additional Medicare IM Given:    Additional Medicare Important Message give by:     If discussed at [place] of Stay Meetings, dates discussed:    Additional Comments:  Rolm Baptise, RN 12/16/2015, 11:45 AM [phone]

## 2015-12-16 NOTE — Discharge Summary (Signed)
[facility] Discharge Summary   Patient: [name] [name] ZOX:[id number]   PCP: Michele Mcalpine, MD DOB: 1950-12-31   Date of admission: 12/15/2015   Date of discharge: 12/16/2015     Discharge Diagnoses:  Principal Problem:   Meningioma [facility]) Active Problems:   Essential hypertension   Dizziness   Brain lesion   Abnormal finding on MRI of brain   Chronic arterial ischemic stroke   Recommendations for Outpatient Follow-up:  1. Please follow-up as recommended   Follow-up Information    Follow up with NADEL,SCOTT M, MD. Schedule an appointment as soon as possible for a visit in 1 week.   Specialty:  Pulmonary Disease   Contact information:   [address] [place] [address] [phone]       Follow up with Loura Halt Ditty, MD. Schedule an appointment as soon as possible for a visit in 1 month.   Specialty:  Neurosurgery   Why:  To establish care and follow-up   Contact information:   [address] [place] [address] [phone]       Follow up with [facility]. Schedule an appointment as soon as possible for a visit in 2 weeks.   Specialty:  Neurology   Why:  To establish care and follow-up   Contact information:   [address] [place] [address] [phone]     Diet recommendation: regular diet  Activity: The patient is advised to gradually reintroduce usual activities.  Discharge Condition: good  History of present illness: As per the H and P dictated on admission, "[name] [name] is a 65 y.o. female with a past medical history significant for HTN who presents with dizziness.  The patient was in her usual state of health until a few weeks ago when she started to notice dizziness while walking her dog. This was a sensation of being "swimmy headed", which she noticed while walking, would stop for a few seconds and then it would go away.   Last night, she worked overnight as usual, came home  and was going to get the dog out when she felt a more intense diziness than before, like the "room was swaying, not spinning." This was so intense that she called 9-1-1 and she was headed to the backdoor to unlock it before they came, when she got so weak she collapsed. There was no headache, focal weakness, speech disturbance, and she doesn't think she lost consciousness. This has never happened before.  In the ED, she was afebrile, hypertensive. Na 138, K 3.4, Cr 0.7, AST 42, WBC 5K, Hgb 12.9, TNI normal, UA clear. A CT of the head was normal, and MRI brain showed a small 7mm T2 enhancing lesion in the R temporal lobe, consistent with a meningioma or a dural metastasis.   Neurology were consulted who felt the patient's dizziness maybe be epileptiform/seizure, started Keppra, recommended EEG and overnight observation, and also recommended that ruling out metastasis (given patient's family history of breast cancer in mother and sister) was necessary.  "  Hospital Course:  Summary of her active problems in the hospital is as following. Principal Problem:   Meningioma (HCC) Dizziness. The patient presented with complaints of dizziness. Orthostatics were negative 2. Telemetry was also negative. EKG was unremarkable. The patient did not have any focal deficit or neck semination. The patient was able to ambulate without any assistance or support. Neurology was initially consulted for the patient who recommended the patient  to undergone an EEG. EEG was also unremarkable. Although neurology recommends to continue Her on discharge and follow-up with PCP in one week. Neurology also recommends to monitor precautions while driving although does not recommend to discontinue driving at completely.  Active Problems:   Essential hypertension Continue blood pressure medication since her orthostatics are negative and blood pressure remains stable.    Brain lesion   Abnormal finding on MRI of  brain Suspected meningioma. Patient will follow-up with neurosurgery in one month. Also follow-up with neurology in 2 weeks.  All other chronic medical condition were stable during the hospitalization.  Patient was ambulatory without any assistance. On the day of the discharge the patient's vitals remained stable and the workup was negative, and no other acute medical condition were reported by patient. the patient was felt safe to be discharge at home with family.  Procedures and Results:  EEG   Consultations:  Neurology   Phone consultation with neurosurgery  DISCHARGE MEDICATION: Discharge Medication List as of 12/16/2015  4:43 PM    START taking these medications   Details  levETIRAcetam (KEPPRA) 500 MG tablet Take 1 tablet (500 mg total) by mouth 2 (two) times daily., Starting 12/16/2015, Until Discontinued, Normal      CONTINUE these medications which have NOT CHANGED   Details  aspirin 81 MG tablet Take 81 mg by mouth daily.  , Until Discontinued, Historical Med    Cholecalciferol (VITAMIN D-3) 1000 UNITS CAPS Take 1 capsule by mouth daily., Until Discontinued, Historical Med    estrogens, conjugated, (PREMARIN) 0.3 MG tablet Take 0.3 mg by mouth daily. Take daily for 21 days then do not take for 7 days. , Until Discontinued, Historical Med    losartan (COZAAR) 100 MG tablet TAKE 1 TABLET (100 MG TOTAL) BY MOUTH DAILY., Normal      STOP taking these medications     LORazepam (ATIVAN) 0.5 MG tablet        No Known Allergies Discharge Instructions    Diet - low sodium heart healthy    Complete by:  As directed      Discharge instructions    Complete by:  As directed   It is important that you read following instructions as well as go over your medication list with RN to help you understand your care after this hospitalization.  Discharge Instructions: Please follow-up with PCP in one week Follow-up with neurology in 2 weeks. Follow-up with neurosurgery in 4  weeks.  Please request your primary care physician to go over all Hospital Tests and Procedure/Radiological results at the follow up,  Please get all Hospital records sent to your PCP by signing hospital release before you go home.   Do not drive, operating heavy machinery, perform activities at heights, swimming or participation in water activities or provide baby sitting services if your were admitted for dizziness; until you have been seen by Primary Care Physician or a Neurologist and advised to do so again. Do not take more than prescribed Pain, Sleep and Anxiety Medications. You were cared for by a hospitalist during your hospital stay. If you have any questions about your discharge medications or the care you received while you were in the hospital after you are discharged, you can call the unit and ask to speak with the hospitalist on call if the hospitalist that took care of you is not available.  Once you are discharged, your primary care physician will handle any further medical issues. Please note that NO  REFILLS for any discharge medications will be authorized once you are discharged, as it is imperative that you return to your primary care physician (or establish a relationship with a primary care physician if you do not have one) for your aftercare needs so that they can reassess your need for medications and monitor your lab values. You Must read complete instructions/literature along with all the possible adverse reactions/side effects for all the Medicines you take and that have been prescribed to you. Take any new Medicines after you have completely understood and accept all the possible adverse reactions/side effects. Wear Seat belts while driving. If you have smoked or chewed Tobacco in the last 2 yrs please stop smoking and/or stop any Recreational drug use.     Increase activity slowly    Complete by:  As directed           Discharge Exam: Filed Weights   12/15/15 0949   Weight: 58.06 kg (128 lb)   Filed Vitals:   12/16/15 1556 12/16/15 1648  BP: 143/80 139/66  Pulse: 84 82  Temp:  97.1 F (36.2 C)  Resp:  18   General: Appear in no distress, no Rash; Oral Mucosa moist. Cardiovascular: S1 and S2 Present, no Murmur, no JVD Respiratory: Bilateral Air entry present and Clear to Auscultation, no Crackles, no wheezes Abdomen: Bowel Sound present, Soft and no tenderness Extremities: no Pedal edema, no calf tenderness Neurology: Grossly no focal neuro deficit.  The results of significant diagnostics from this hospitalization (including imaging, microbiology, ancillary and laboratory) are listed below for reference.    Significant Diagnostic Studies: Ct Head Wo Contrast  12/15/2015  CLINICAL DATA:  Fatigue, dizziness, headache starting this morning EXAM: CT HEAD WITHOUT CONTRAST TECHNIQUE: Contiguous axial images were obtained from the base of the skull through the vertex without intravenous contrast. COMPARISON:  None. FINDINGS: Brain: No intracranial hemorrhage, mass effect or midline shift. No acute cortical infarction. No hydrocephalus. No intra or extra-axial fluid collection. No mass lesion is noted on this unenhanced scan. Vascular: No hyperdense vessel or unexpected calcification. Skull: Negative for fracture or focal lesion. Sinuses/Orbits: No acute findings. Other: None. IMPRESSION: No acute intracranial abnormality. No definite acute cortical infarction. Electronically Signed   By: Natasha Mead M.D.   On: 12/15/2015 12:42   Ct Chest W Contrast  12/15/2015  CLINICAL DATA:  65 year old female with indeterminate anterior right temporal lobe lesion on noncontrast brain MRI, which was described as a subcentimeter meningioma at the floor of the right middle cranial fossa on subsequent post-contrast MRI. Clinical request to evaluate for metastatic disease. EXAM: CT CHEST, ABDOMEN, AND PELVIS WITH CONTRAST TECHNIQUE: Multidetector CT imaging of the chest, abdomen  and pelvis was performed following the standard protocol during bolus administration of intravenous contrast. CONTRAST:  OMNIPAQUE IOHEXOL 300 MG/ML  SOLN COMPARISON:  No prior CT of the chest, abdomen or pelvis. 07/28/2014 chest radiograph. FINDINGS: CT CHEST Mediastinum/Nodes: Normal heart size. Trace pericardial fluid/thickening. Great vessels are normal in course and caliber. No central pulmonary emboli. Normal visualized thyroid. Normal esophagus. No pathologically enlarged axillary, mediastinal or hilar lymph nodes. Lungs/Pleura: No pneumothorax. No pleural effusion. Minimal tree-in-bud opacity in the right middle lobe without appreciable bronchiectasis. Mild osteophyte related fibrosis in the medial right lower lobe. No acute consolidative airspace disease, significant pulmonary nodules or lung masses. Musculoskeletal: There are a few small non expansile sclerotic foci scattered throughout the sternum. There is patchy increased sclerosis in the T5 vertebral body. Increased sclerosis in  the superior T12 endplate is probably degenerative. Mild degenerative changes are present in the thoracic spine. Intact appearing bilateral breast prostheses. CT ABDOMEN AND PELVIS Hepatobiliary: Normal liver with no liver mass. Normal gallbladder with no radiopaque cholelithiasis. No biliary ductal dilatation. Pancreas: Normal, with no mass or duct dilation. Spleen: Normal size. No mass. Adrenals/Urinary Tract: Normal adrenals. Normal kidneys with no hydronephrosis and no renal mass. Normal bladder. Stomach/Bowel: Grossly normal stomach. Normal caliber small bowel with no small bowel wall thickening. Normal appendix. Mild sigmoid diverticulosis, with no large bowel wall thickening or pericolonic fat stranding. Vascular/Lymphatic: Normal caliber abdominal aorta. Patent portal, splenic and renal veins. No pathologically enlarged lymph nodes in the abdomen or pelvis. Reproductive: Status post hysterectomy, with no abnormal  findings at the vaginal cuff. No adnexal mass. Other: No pneumoperitoneum, ascites or focal fluid collection. Musculoskeletal: Subcentimeter sclerotic focus in the left L4 vertebral body, nonspecific. No additional focal osseous lesions in the abdomen or pelvis. IMPRESSION: 1. Nonspecific small sclerotic foci in the sternum and T5 and L4 vertebral bodies. Consider correlation with a bone scintigraphy study on a short term outpatient basis. 2. Otherwise no potential sites of metastatic disease in the chest, abdomen or pelvis. 3. Minimal tree-in-bud opacity in the right middle lobe without bronchiectasis, favoring a minimal infectious or inflammatory bronchiolitis of doubtful clinical significance. 4. Mild sigmoid diverticulosis. Electronically Signed   By: Delbert Phenix M.D.   On: 12/15/2015 21:00   Mr Brain Wo Contrast (neuro Protocol)  12/15/2015  CLINICAL DATA:  Sudden onset severe dizziness with vomiting. Near syncope. Hypertensive. EXAM: MRI HEAD WITHOUT CONTRAST MRA HEAD WITHOUT CONTRAST TECHNIQUE: Multiplanar, multiecho pulse sequences of the brain and surrounding structures were obtained without intravenous contrast. Angiographic images of the head were obtained using MRA technique without contrast. COMPARISON:  Head CT 12/15/2015 and MRI 12/10/2005 FINDINGS: MRI HEAD FINDINGS There is no evidence of acute infarct, intracranial hemorrhage, midline shift, or extra-axial fluid collection. Ventricles and sulci are normal. There are a few punctate foci of T2 hyperintensity in the subcortical cerebral white matter which are nonspecific and not greater than expected for patient's age. Additionally there is a 7 mm oval T2 hyperintense focus in the anterior right temporal lobe which appears to involve cortex and is new from the prior MRI. There is a tiny, chronic infarct in the left cerebellum which is new from the prior MRI. Prior right cataract extraction is noted. Paranasal sinuses and mastoid air cells are  clear. Major intracranial vascular flow voids are preserved. MRA HEAD FINDINGS The visualized distal vertebral arteries are patent and codominant. PICA, AICA, and SCA origins are patent. Basilar artery is patent without stenosis. There is likely a tiny right posterior communicating artery. PCAs are patent without evidence of significant proximal stenosis. The internal carotid arteries are patent from skullbase to carotid termini without evidence of stenosis. The cavernous segments are mildly tortuous bilaterally. ACAs and MCAs are patent without evidence of significant proximal stenosis or major branch occlusion. Anterior communicating artery is present. No intracranial aneurysm is identified. IMPRESSION: 1. No acute infarct. 2. Tiny chronic left cerebellar infarct. 3. New 7 mm T2 hyperintense lesion involving anterior right temporal lobe cortex, indeterminate. Postcontrast imaging is recommended to assess for small neoplasm. 4. No major intracranial arterial occlusion or significant stenosis. Electronically Signed   By: Sebastian Ache M.D.   On: 12/15/2015 16:41   Mr Laqueta Jean Contrast  12/15/2015  CLINICAL DATA:  Sudden onset of severe dizziness with vomiting. EXAM: MRI  HEAD WITH CONTRAST TECHNIQUE: Multiplanar, multiecho pulse sequences of the brain and surrounding structures were obtained with intravenous contrast. COMPARISON:  None. CONTRAST:  10mL MULTIHANCE GADOBENATE DIMEGLUMINE 529 MG/ML IV SOLN FINDINGS: Post infusion, 5 x 7 x 5 mm enhancing lesion along the floor of the RIGHT middle cranial fossa is demonstrated, extra-axial, small dural tail, consistent with a meningioma. No other similar lesions. 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 Electronically Signed   By: Elsie Stain M.D.   On: 12/15/2015 19:29   Ct Abdomen Pelvis W Contrast  12/15/2015  CLINICAL DATA:  65 year old female with indeterminate anterior right temporal lobe lesion on  noncontrast brain MRI, which was described as a subcentimeter meningioma at the floor of the right middle cranial fossa on subsequent post-contrast MRI. Clinical request to evaluate for metastatic disease. EXAM: CT CHEST, ABDOMEN, AND PELVIS WITH CONTRAST TECHNIQUE: Multidetector CT imaging of the chest, abdomen and pelvis was performed following the standard protocol during bolus administration of intravenous contrast. CONTRAST:  OMNIPAQUE IOHEXOL 300 MG/ML  SOLN COMPARISON:  No prior CT of the chest, abdomen or pelvis. 07/28/2014 chest radiograph. FINDINGS: CT CHEST Mediastinum/Nodes: Normal heart size. Trace pericardial fluid/thickening. Great vessels are normal in course and caliber. No central pulmonary emboli. Normal visualized thyroid. Normal esophagus. No pathologically enlarged axillary, mediastinal or hilar lymph nodes. Lungs/Pleura: No pneumothorax. No pleural effusion. Minimal tree-in-bud opacity in the right middle lobe without appreciable bronchiectasis. Mild osteophyte related fibrosis in the medial right lower lobe. No acute consolidative airspace disease, significant pulmonary nodules or lung masses. Musculoskeletal: There are a few small non expansile sclerotic foci scattered throughout the sternum. There is patchy increased sclerosis in the T5 vertebral body. Increased sclerosis in the superior T12 endplate is probably degenerative. Mild degenerative changes are present in the thoracic spine. Intact appearing bilateral breast prostheses. CT ABDOMEN AND PELVIS Hepatobiliary: Normal liver with no liver mass. Normal gallbladder with no radiopaque cholelithiasis. No biliary ductal dilatation. Pancreas: Normal, with no mass or duct dilation. Spleen: Normal size. No mass. Adrenals/Urinary Tract: Normal adrenals. Normal kidneys with no hydronephrosis and no renal mass. Normal bladder. Stomach/Bowel: Grossly normal stomach. Normal caliber small bowel with no small bowel wall thickening. Normal  appendix. Mild sigmoid diverticulosis, with no large bowel wall thickening or pericolonic fat stranding. Vascular/Lymphatic: Normal caliber abdominal aorta. Patent portal, splenic and renal veins. No pathologically enlarged lymph nodes in the abdomen or pelvis. Reproductive: Status post hysterectomy, with no abnormal findings at the vaginal cuff. No adnexal mass. Other: No pneumoperitoneum, ascites or focal fluid collection. Musculoskeletal: Subcentimeter sclerotic focus in the left L4 vertebral body, nonspecific. No additional focal osseous lesions in the abdomen or pelvis. IMPRESSION: 1. Nonspecific small sclerotic foci in the sternum and T5 and L4 vertebral bodies. Consider correlation with a bone scintigraphy study on a short term outpatient basis. 2. Otherwise no potential sites of metastatic disease in the chest, abdomen or pelvis. 3. Minimal tree-in-bud opacity in the right middle lobe without bronchiectasis, favoring a minimal infectious or inflammatory bronchiolitis of doubtful clinical significance. 4. Mild sigmoid diverticulosis. Electronically Signed   By: Delbert Phenix M.D.   On: 12/15/2015 21:00   Mr Shirlee Latch (cerebral Arteries)  12/15/2015  CLINICAL DATA:  Sudden onset severe dizziness with vomiting. Near syncope. Hypertensive. EXAM: MRI HEAD WITHOUT CONTRAST MRA HEAD WITHOUT CONTRAST TECHNIQUE: Multiplanar, multiecho pulse sequences of the brain and surrounding structures were obtained without intravenous contrast. Angiographic images of the  head were obtained using MRA technique without contrast. COMPARISON:  Head CT 12/15/2015 and MRI 12/10/2005 FINDINGS: MRI HEAD FINDINGS There is no evidence of acute infarct, intracranial hemorrhage, midline shift, or extra-axial fluid collection. Ventricles and sulci are normal. There are a few punctate foci of T2 hyperintensity in the subcortical cerebral white matter which are nonspecific and not greater than expected for patient's age. Additionally there is  a 7 mm oval T2 hyperintense focus in the anterior right temporal lobe which appears to involve cortex and is new from the prior MRI. There is a tiny, chronic infarct in the left cerebellum which is new from the prior MRI. Prior right cataract extraction is noted. Paranasal sinuses and mastoid air cells are clear. Major intracranial vascular flow voids are preserved. MRA HEAD FINDINGS The visualized distal vertebral arteries are patent and codominant. PICA, AICA, and SCA origins are patent. Basilar artery is patent without stenosis. There is likely a tiny right posterior communicating artery. PCAs are patent without evidence of significant proximal stenosis. The internal carotid arteries are patent from skullbase to carotid termini without evidence of stenosis. The cavernous segments are mildly tortuous bilaterally. ACAs and MCAs are patent without evidence of significant proximal stenosis or major branch occlusion. Anterior communicating artery is present. No intracranial aneurysm is identified. IMPRESSION: 1. No acute infarct. 2. Tiny chronic left cerebellar infarct. 3. New 7 mm T2 hyperintense lesion involving anterior right temporal lobe cortex, indeterminate. Postcontrast imaging is recommended to assess for small neoplasm. 4. No major intracranial arterial occlusion or significant stenosis. Electronically Signed   By: Sebastian Ache M.D.   On: 12/15/2015 16:41    Microbiology: Recent Results (from the past 240 hour(s))  Urine culture     Status: None (Preliminary result)   Collection Time: 12/15/15 11:40 AM  Result Value Ref Range Status   Specimen Description URINE, CLEAN CATCH  Final   Special Requests NONE  Final   Culture CULTURE REINCUBATED FOR BETTER GROWTH  Final   Report Status PENDING  Incomplete     Labs: CBC:  Recent Labs Lab 12/15/15 1020  WBC 5.5  NEUTROABS 3.8  HGB 12.9  HCT 38.5  MCV 86.5  PLT 266   Basic Metabolic Panel:  Recent Labs Lab 12/15/15 1020  NA 138  K  3.4*  CL 102  CO2 26  GLUCOSE 119*  BUN 11  CREATININE 0.71  CALCIUM 9.0   Liver Function Tests:  Recent Labs Lab 12/15/15 1020  AST 42*  ALT 20  ALKPHOS 78  BILITOT 0.7  PROT 7.7  ALBUMIN 3.9   No results for input(s): LIPASE, AMYLASE in the last 168 hours. No results for input(s): AMMONIA in the last 168 hours. Cardiac Enzymes: No results for input(s): CKTOTAL, CKMB, CKMBINDEX, TROPONINI in the last 168 hours. BNP (last 3 results) No results for input(s): BNP in the last 8760 hours. CBG:  Recent Labs Lab 12/15/15 1018  GLUCAP 116*   Time spent: 30 minutes  Signed:  Nicey Krah  [facility] 12/16/2015 , 6:45 PM

## 2015-12-16 NOTE — Progress Notes (Signed)
Interval History:                                                                                                                      [name] [name] is an 65 y.o. female patient with continues dizzy/ woozy sensation. No other issues.    Past Medical History: Past Medical History  Diagnosis Date  . Hypertension   . Varicose veins of lower extremities   . Hypercholesterolemia   . GERD (gastroesophageal reflux disease)   . Colonic polyp   . Headache(784.0)   . Anxiety   . Idiopathic urticaria     Past Surgical History  Procedure Laterality Date  . Vaginal hysterectomy    . Bilateral breast implants    . Benign breast biopsy    . Varicose vein    . Cataract right eye w/ lens implant    . Excision of large anal polyp    . Colonoscopy    . Polypectomy      Family History: Family History  Problem Relation Age of Onset  . Hypertension Mother   . Hyperlipidemia Mother   . Diabetes Mother   . Breast cancer Mother   . Pulmonary embolism Mother   . Deep vein thrombosis Mother   . Colon cancer Neg Hx   . Esophageal cancer Neg Hx   . Stomach cancer Neg Hx   . Dementia Father   . Sickle cell anemia Cousin     Social History:   reports that she has never smoked. She does not have any smokeless tobacco history on file. She reports that she does not drink alcohol or use illicit drugs.  Allergies:  No Known Allergies   Medications:                                                                                                                         Current facility-administered medications:  .  acetaminophen (TYLENOL) tablet 650 mg, 650 mg, Oral, Q6H PRN **OR** acetaminophen (TYLENOL) suppository 650 mg, 650 mg, Rectal, Q6H PRN, Edwin Dada, MD .  aspirin chewable tablet 81 mg, 81 mg, Oral, Daily, Edwin Dada, MD, 81 mg at 12/16/15 0924 .  estrogens (conjugated) (PREMARIN) tablet 0.3 mg, 0.3 mg, Oral, Daily, Edwin Dada, MD, 0.3 mg at 12/16/15  0924 .  levETIRAcetam (KEPPRA) tablet 500 mg, 500 mg, Oral, BID, Ram Fuller Mandril, MD, 500 mg at 12/16/15 [id number] .  losartan (COZAAR) tablet 100 mg, 100  mg, Oral, Daily, Edwin Dada, MD, 100 mg at 12/16/15 0924 .  zolpidem (AMBIEN) tablet 5 mg, 5 mg, Oral, QHS PRN, Edwin Dada, MD   Neurologic Examination:                                                                                                     Today's Vitals   12/15/15 2000 12/15/15 2057 12/16/15 0130 12/16/15 0525  BP: 154/78 176/99 141/77 130/63  Pulse: 67 79 65 82  Temp:  98.2 F (36.8 C) 98.2 F (36.8 C) 97.6 F (36.4 C)  TempSrc:  Axillary Axillary Oral  Resp:  18 18 18   Height:      Weight:      SpO2: 98% 100% 98% 99%  PainSc:  0-No pain      Evaluation of higher integrative functions including: Level of alertness: Alert,  Oriented to time, place and person Speech: fluent, no evidence of dysarthria or aphasia noted.  Test the following cranial nerves: 2-12 grossly intact Motor examination: Normal tone, bulk, full 5/5 motor strength in all 4 extremities Examination of sensation : Normal and symmetric sensation to pinprick in all 4 extremities and on face Examination of deep tendon reflexes: 2+, normal and symmetric in all extremities, normal plantars bilaterally Test coordination: Normal finger nose testing, with no evidence of limb appendicular ataxia or abnormal involuntary movements or tremors noted.  Gait: Deferred   Lab Results: Basic Metabolic Panel:  Recent Labs Lab 12/15/15 1020  NA 138  K 3.4*  CL 102  CO2 26  GLUCOSE 119*  BUN 11  CREATININE 0.71  CALCIUM 9.0    Liver Function Tests:  Recent Labs Lab 12/15/15 1020  AST 42*  ALT 20  ALKPHOS 78  BILITOT 0.7  PROT 7.7  ALBUMIN 3.9   No results for input(s): LIPASE, AMYLASE in the last 168 hours. No results for input(s): AMMONIA in the last 168 hours.  CBC:  Recent Labs Lab 12/15/15 1020  WBC  5.5  NEUTROABS 3.8  HGB 12.9  HCT 38.5  MCV 86.5  PLT 266    Cardiac Enzymes: No results for input(s): CKTOTAL, CKMB, CKMBINDEX, TROPONINI in the last 168 hours.  Lipid Panel: No results for input(s): CHOL, TRIG, HDL, CHOLHDL, VLDL, LDLCALC in the last 168 hours.  CBG:  Recent Labs Lab 12/15/15 1018  GLUCAP 116*    Microbiology: No results found for this or any previous visit.  Imaging: Ct Head Wo Contrast  12/15/2015  CLINICAL DATA:  Fatigue, dizziness, headache starting this morning EXAM: CT HEAD WITHOUT CONTRAST TECHNIQUE: Contiguous axial images were obtained from the base of the skull through the vertex without intravenous contrast. COMPARISON:  None. FINDINGS: Brain: No intracranial hemorrhage, mass effect or midline shift. No acute cortical infarction. No hydrocephalus. No intra or extra-axial fluid collection. No mass lesion is noted on this unenhanced scan. Vascular: No hyperdense vessel or unexpected calcification. Skull: Negative for fracture or focal lesion. Sinuses/Orbits: No acute findings. Other: None. IMPRESSION: No acute intracranial abnormality. No definite acute cortical infarction. Electronically Signed  By: Lahoma Crocker M.D.   On: 12/15/2015 12:42   Ct Chest W Contrast  12/15/2015  CLINICAL DATA:  65 year old female with indeterminate anterior right temporal lobe lesion on noncontrast brain MRI, which was described as a subcentimeter meningioma at the floor of the right middle cranial fossa on subsequent post-contrast MRI. Clinical request to evaluate for metastatic disease. EXAM: CT CHEST, ABDOMEN, AND PELVIS WITH CONTRAST TECHNIQUE: Multidetector CT imaging of the chest, abdomen and pelvis was performed following the standard protocol during bolus administration of intravenous contrast. CONTRAST:  1108mL OMNIPAQUE IOHEXOL 300 MG/ML  SOLN COMPARISON:  No prior CT of the chest, abdomen or pelvis. 07/28/2014 chest radiograph. FINDINGS: CT CHEST Mediastinum/Nodes:  Normal heart size. Trace pericardial fluid/thickening. Great vessels are normal in course and caliber. No central pulmonary emboli. Normal visualized thyroid. Normal esophagus. No pathologically enlarged axillary, mediastinal or hilar lymph nodes. Lungs/Pleura: No pneumothorax. No pleural effusion. Minimal tree-in-bud opacity in the right middle lobe without appreciable bronchiectasis. Mild osteophyte related fibrosis in the medial right lower lobe. No acute consolidative airspace disease, significant pulmonary nodules or lung masses. Musculoskeletal: There are a few small non expansile sclerotic foci scattered throughout the sternum. There is patchy increased sclerosis in the T5 vertebral body. Increased sclerosis in the superior T12 endplate is probably degenerative. Mild degenerative changes are present in the thoracic spine. Intact appearing bilateral breast prostheses. CT ABDOMEN AND PELVIS Hepatobiliary: Normal liver with no liver mass. Normal gallbladder with no radiopaque cholelithiasis. No biliary ductal dilatation. Pancreas: Normal, with no mass or duct dilation. Spleen: Normal size. No mass. Adrenals/Urinary Tract: Normal adrenals. Normal kidneys with no hydronephrosis and no renal mass. Normal bladder. Stomach/Bowel: Grossly normal stomach. Normal caliber small bowel with no small bowel wall thickening. Normal appendix. Mild sigmoid diverticulosis, with no large bowel wall thickening or pericolonic fat stranding. Vascular/Lymphatic: Normal caliber abdominal aorta. Patent portal, splenic and renal veins. No pathologically enlarged lymph nodes in the abdomen or pelvis. Reproductive: Status post hysterectomy, with no abnormal findings at the vaginal cuff. No adnexal mass. Other: No pneumoperitoneum, ascites or focal fluid collection. Musculoskeletal: Subcentimeter sclerotic focus in the left L4 vertebral body, nonspecific. No additional focal osseous lesions in the abdomen or pelvis. IMPRESSION: 1.  Nonspecific small sclerotic foci in the sternum and T5 and L4 vertebral bodies. Consider correlation with a bone scintigraphy study on a short term outpatient basis. 2. Otherwise no potential sites of metastatic disease in the chest, abdomen or pelvis. 3. Minimal tree-in-bud opacity in the right middle lobe without bronchiectasis, favoring a minimal infectious or inflammatory bronchiolitis of doubtful clinical significance. 4. Mild sigmoid diverticulosis. Electronically Signed   By: Ilona Sorrel M.D.   On: 12/15/2015 21:00   Mr Brain Wo Contrast (neuro Protocol)  12/15/2015  CLINICAL DATA:  Sudden onset severe dizziness with vomiting. Near syncope. Hypertensive. EXAM: MRI HEAD WITHOUT CONTRAST MRA HEAD WITHOUT CONTRAST TECHNIQUE: Multiplanar, multiecho pulse sequences of the brain and surrounding structures were obtained without intravenous contrast. Angiographic images of the head were obtained using MRA technique without contrast. COMPARISON:  Head CT 12/15/2015 and MRI 12/10/2005 FINDINGS: MRI HEAD FINDINGS There is no evidence of acute infarct, intracranial hemorrhage, midline shift, or extra-axial fluid collection. Ventricles and sulci are normal. There are a few punctate foci of T2 hyperintensity in the subcortical cerebral white matter which are nonspecific and not greater than expected for patient's age. Additionally there is a 7 mm oval T2 hyperintense focus in the anterior right temporal lobe which appears  to involve cortex and is new from the prior MRI. There is a tiny, chronic infarct in the left cerebellum which is new from the prior MRI. Prior right cataract extraction is noted. Paranasal sinuses and mastoid air cells are clear. Major intracranial vascular flow voids are preserved. MRA HEAD FINDINGS The visualized distal vertebral arteries are patent and codominant. PICA, AICA, and SCA origins are patent. Basilar artery is patent without stenosis. There is likely a tiny right posterior communicating  artery. PCAs are patent without evidence of significant proximal stenosis. The internal carotid arteries are patent from skullbase to carotid termini without evidence of stenosis. The cavernous segments are mildly tortuous bilaterally. ACAs and MCAs are patent without evidence of significant proximal stenosis or major branch occlusion. Anterior communicating artery is present. No intracranial aneurysm is identified. IMPRESSION: 1. No acute infarct. 2. Tiny chronic left cerebellar infarct. 3. New 7 mm T2 hyperintense lesion involving anterior right temporal lobe cortex, indeterminate. Postcontrast imaging is recommended to assess for small neoplasm. 4. No major intracranial arterial occlusion or significant stenosis. Electronically Signed   By: Logan Bores M.D.   On: 12/15/2015 16:41   Mr Jeri Cos Contrast  12/15/2015  CLINICAL DATA:  Sudden onset of severe dizziness with vomiting. EXAM: MRI HEAD WITH CONTRAST TECHNIQUE: Multiplanar, multiecho pulse sequences of the brain and surrounding structures were obtained with intravenous contrast. COMPARISON:  None. CONTRAST:  45mL MULTIHANCE GADOBENATE DIMEGLUMINE 529 MG/ML IV SOLN FINDINGS: Post infusion, 5 x 7 x 5 mm enhancing lesion along the floor of the RIGHT middle cranial fossa is demonstrated, extra-axial, small dural tail, consistent with a meningioma. No other similar lesions. 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 Electronically Signed   By: Staci Righter M.D.   On: 12/15/2015 19:29   Ct Abdomen Pelvis W Contrast  12/15/2015  CLINICAL DATA:  65 year old female with indeterminate anterior right temporal lobe lesion on noncontrast brain MRI, which was described as a subcentimeter meningioma at the floor of the right middle cranial fossa on subsequent post-contrast MRI. Clinical request to evaluate for metastatic disease. EXAM: CT CHEST, ABDOMEN, AND PELVIS WITH CONTRAST TECHNIQUE: Multidetector  CT imaging of the chest, abdomen and pelvis was performed following the standard protocol during bolus administration of intravenous contrast. CONTRAST:  189mL OMNIPAQUE IOHEXOL 300 MG/ML  SOLN COMPARISON:  No prior CT of the chest, abdomen or pelvis. 07/28/2014 chest radiograph. FINDINGS: CT CHEST Mediastinum/Nodes: Normal heart size. Trace pericardial fluid/thickening. Great vessels are normal in course and caliber. No central pulmonary emboli. Normal visualized thyroid. Normal esophagus. No pathologically enlarged axillary, mediastinal or hilar lymph nodes. Lungs/Pleura: No pneumothorax. No pleural effusion. Minimal tree-in-bud opacity in the right middle lobe without appreciable bronchiectasis. Mild osteophyte related fibrosis in the medial right lower lobe. No acute consolidative airspace disease, significant pulmonary nodules or lung masses. Musculoskeletal: There are a few small non expansile sclerotic foci scattered throughout the sternum. There is patchy increased sclerosis in the T5 vertebral body. Increased sclerosis in the superior T12 endplate is probably degenerative. Mild degenerative changes are present in the thoracic spine. Intact appearing bilateral breast prostheses. CT ABDOMEN AND PELVIS Hepatobiliary: Normal liver with no liver mass. Normal gallbladder with no radiopaque cholelithiasis. No biliary ductal dilatation. Pancreas: Normal, with no mass or duct dilation. Spleen: Normal size. No mass. Adrenals/Urinary Tract: Normal adrenals. Normal kidneys with no hydronephrosis and no renal mass. Normal bladder. Stomach/Bowel: Grossly normal stomach. Normal caliber small bowel with no small  bowel wall thickening. Normal appendix. Mild sigmoid diverticulosis, with no large bowel wall thickening or pericolonic fat stranding. Vascular/Lymphatic: Normal caliber abdominal aorta. Patent portal, splenic and renal veins. No pathologically enlarged lymph nodes in the abdomen or pelvis. Reproductive: Status  post hysterectomy, with no abnormal findings at the vaginal cuff. No adnexal mass. Other: No pneumoperitoneum, ascites or focal fluid collection. Musculoskeletal: Subcentimeter sclerotic focus in the left L4 vertebral body, nonspecific. No additional focal osseous lesions in the abdomen or pelvis. IMPRESSION: 1. Nonspecific small sclerotic foci in the sternum and T5 and L4 vertebral bodies. Consider correlation with a bone scintigraphy study on a short term outpatient basis. 2. Otherwise no potential sites of metastatic disease in the chest, abdomen or pelvis. 3. Minimal tree-in-bud opacity in the right middle lobe without bronchiectasis, favoring a minimal infectious or inflammatory bronchiolitis of doubtful clinical significance. 4. Mild sigmoid diverticulosis. Electronically Signed   By: Ilona Sorrel M.D.   On: 12/15/2015 21:00   Mr Virgel Paling (cerebral Arteries)  12/15/2015  CLINICAL DATA:  Sudden onset severe dizziness with vomiting. Near syncope. Hypertensive. EXAM: MRI HEAD WITHOUT CONTRAST MRA HEAD WITHOUT CONTRAST TECHNIQUE: Multiplanar, multiecho pulse sequences of the brain and surrounding structures were obtained without intravenous contrast. Angiographic images of the head were obtained using MRA technique without contrast. COMPARISON:  Head CT 12/15/2015 and MRI 12/10/2005 FINDINGS: MRI HEAD FINDINGS There is no evidence of acute infarct, intracranial hemorrhage, midline shift, or extra-axial fluid collection. Ventricles and sulci are normal. There are a few punctate foci of T2 hyperintensity in the subcortical cerebral white matter which are nonspecific and not greater than expected for patient's age. Additionally there is a 7 mm oval T2 hyperintense focus in the anterior right temporal lobe which appears to involve cortex and is new from the prior MRI. There is a tiny, chronic infarct in the left cerebellum which is new from the prior MRI. Prior right cataract extraction is noted. Paranasal sinuses  and mastoid air cells are clear. Major intracranial vascular flow voids are preserved. MRA HEAD FINDINGS The visualized distal vertebral arteries are patent and codominant. PICA, AICA, and SCA origins are patent. Basilar artery is patent without stenosis. There is likely a tiny right posterior communicating artery. PCAs are patent without evidence of significant proximal stenosis. The internal carotid arteries are patent from skullbase to carotid termini without evidence of stenosis. The cavernous segments are mildly tortuous bilaterally. ACAs and MCAs are patent without evidence of significant proximal stenosis or major branch occlusion. Anterior communicating artery is present. No intracranial aneurysm is identified. IMPRESSION: 1. No acute infarct. 2. Tiny chronic left cerebellar infarct. 3. New 7 mm T2 hyperintense lesion involving anterior right temporal lobe cortex, indeterminate. Postcontrast imaging is recommended to assess for small neoplasm. 4. No major intracranial arterial occlusion or significant stenosis. Electronically Signed   By: Logan Bores M.D.   On: 12/15/2015 16:41    Assessment and plan:   [name] [name] is an 65 y.o. female patient who presented for evaluation of a acute episode of persistent dizziness and lightheaded feeling status morning. She has been having the similar episodes to mild extent, left times a day for the past 2-3 weeks. No other neurological symptoms. Her neurological examination is nonfocal. Incidental small focal area of signal change is seen in the noncontrast MRI done as part of her workup in the ER in the right lateral temporal lobe.  MRI of the brain with contrast for further evaluation of this lesion. Small  intensely enhancing dural based lesion is seen in the right lateral temporal lobe. The differential certainly includes a benign meningioma based on its appearance. She had a brain MRI in 2007 which did not show any evidence of this enhancing lesion, which  appears to be greater than usual. Given the strong family history of breast cancer in her mother and maternal aunt, we ordered a CT of the chest, abdomen and pelvis which was negative.  ER provider spoke to neurosurgery as well to review MRI images for their input.  Still feeling dizzy. Awaiting EEG. Currently on Keppra 500 mg BID and tolerating well.   Will be seen by Dr. Silverio Decamp. Please see his attestation note for A/P for any additional work up recommendations.

## 2015-12-16 NOTE — Procedures (Signed)
HPI:  65 y/o with dizziness and near syncope  TECHNICAL SUMMARY:  A multichannel referential and bipolar montage EEG using the standard international 10-20 system was performed on the patient described as awake, drowsy and asleep.  The dominant background activity consists of a low voltage 10-11 hertz activity seen most prominantly over the posterior head region.  The backgound activity is reactive to eye opening and closing procedures.  Low voltage fast (beta) activity is distributed symmetrically and maximally over the anterior head regions.  ACTIVATION:  Stepwise photic stimulation at 4-20 flashes per second was performed and did not elicit any abnormal waveforms.  Hyperventilation was not performed.  EPILEPTIFORM ACTIVITY:  There were no spikes, sharp waves or paroxysmal activity.  SLEEP:  Stage I and stage II sleep are noted.  CARDIAC:  The EKG lead revealed a regular sinus rhythm.  IMPRESSION:  This is a normal EEG for the patients stated age.  There were no focal, hemispheric or lateralizing features.  No epileptiform activity was recorded.  A normal EEG does not exclude the diagnosis of a seizure disorder and if seizure remains high on the list of differential diagnosis, an ambulatory EEG may be of value.  Clinical correlation is required.

## 2015-12-17 ENCOUNTER — Telehealth: Payer: Self-pay | Admitting: Pulmonary Disease

## 2015-12-17 LAB — URINE CULTURE

## 2015-12-17 NOTE — Telephone Encounter (Signed)
Per 12/16/15 hospital d/c: Follow-up Information    Follow up with NADEL,SCOTT M, MD. Schedule an appointment as soon as possible for a visit in 1 week.      ---  Called spoke with pt. appt scheduled with TP 4/4 for HFU. Nothing further needed

## 2015-12-22 ENCOUNTER — Encounter: Payer: Self-pay | Admitting: Acute Care

## 2015-12-22 ENCOUNTER — Ambulatory Visit (INDEPENDENT_AMBULATORY_CARE_PROVIDER_SITE_OTHER): Payer: Federal, State, Local not specified - PPO | Admitting: Acute Care

## 2015-12-22 VITALS — BP 124/82 | HR 75 | Ht 62.0 in | Wt 133.4 lb

## 2015-12-22 DIAGNOSIS — D329 Benign neoplasm of meninges, unspecified: Secondary | ICD-10-CM | POA: Diagnosis not present

## 2015-12-22 NOTE — Assessment & Plan Note (Signed)
Meningioma, new diagnosis Plan: We will schedule you for a Bone Scan Continue taking your Keppra as directed, Neurology to determine length of treatment. Follow up with [facility] within the 2 weeks of your hospital discharge as instructed. Follow up with Dr. Cyndy Freeze within a month of discharge as instructed. Follow up with Dr. Lenna Gilford the week of May 1st, 2017, after you have seen both of the above MD's.. We will provide you with a work note from hospitalization through when you see the neurologist. Please contact office for sooner follow up if symptoms do not improve or worsen or seek emergency care

## 2015-12-22 NOTE — Patient Instructions (Addendum)
It is nice to meet you today. I am glad you are feeling better. We will schedule you for a Bone Scan Continue taking your Keppra as directed. Follow up with [facility] within the 2 week of your hospital discharge. Follow up with Dr. Cyndy Freeze within a month of discharge. Follow up with Dr. Lenna Gilford the week of May 1st, 2017. We will provide you with a work note from hospitalization through when you see the neurologist. Please contact office for sooner follow up if symptoms do not improve or worsen or seek emergency care

## 2015-12-22 NOTE — Progress Notes (Signed)
Subjective:    Patient ID: [name] [name], female    DOB: 08-11-1951, 65 y.o.   MRN: [id number]  HPI [name] [name] is a 65 y.o. female with a past medical history significant for HTN and chronic arterial ischemic stroke seen by Dr. Kriste Basque.  Significant Tests/ Procedures: Recent Hospitalization: Dizziness Date of Admission: 12/15/2015 Date of Discharge:12/16/2015          Discharge Diagnoses:  Principal Problem:  Meningioma [facility]) Active Problems:  Essential hypertension  Dizziness  Brain lesion  Abnormal finding on MRI of brain  Chronic arterial ischemic stroke  EEG Normal EKG Normal Orthostatics negative  12/15/2015: MRI: FINDINGS: Post infusion, 5 x 7 x 5 mm enhancing lesion along the floor of the RIGHT middle cranial fossa is demonstrated, extra-axial, small dural tail, consistent with a meningioma. No other similar lesions.  IMPRESSION: Subcentimeter meningioma is demonstrated along the floor of the RIGHT middle cranial fossa  CT Chest: IMPRESSION: 1. Nonspecific small sclerotic foci in the sternum and T5 and L4 vertebral bodies. Consider correlation with a bone scintigraphy study on a short term outpatient basis. 2. Otherwise no potential sites of metastatic disease in the chest, abdomen or pelvis. 3. Minimal tree-in-bud opacity in the right middle lobe without bronchiectasis, favoring a minimal infectious or inflammatory bronchiolitis of doubtful clinical significance. 4. Mild sigmoid diverticulosis.  12/22/2015: Hospital Follow Up: Pt. Presents to the office today for Hospital Follow Up of new diagnosis Meningioma. Concern regarding need to R/O malignancy due to strong family history of breast cancer in both mother and sister.Follow up Bone Scan recommended to further follow Nonspecific small sclerotic foci in the sternum and T5 and L4 vertebral bodies.  She is doing well. She is taking her Keppra as prescribed by neurology. She does continue to  have some dizziness, but not to the extent she had prior to hospitalization.She denies fever, cough, SOB, chest pain , hemoptysis or orthopnea. Dr. Kriste Basque and I discussed her hospital admission and plan of care prior to her appointment today.   Current outpatient prescriptions:  .  aspirin 81 MG tablet, Take 81 mg by mouth daily.  , Disp: , Rfl:  .  Cholecalciferol (VITAMIN D-3) 1000 UNITS CAPS, Take 1 capsule by mouth daily. Take 5000 units daily, Disp: , Rfl:  .  estrogens, conjugated, (PREMARIN) 0.3 MG tablet, Take 0.3 mg by mouth daily. Take daily for 21 days then do not take for 7 days. , Disp: , Rfl:  .  levETIRAcetam (KEPPRA) 500 MG tablet, Take 1 tablet (500 mg total) by mouth 2 (two) times daily., Disp: 30 tablet, Rfl: 0 .  losartan (COZAAR) 100 MG tablet, TAKE 1 TABLET (100 MG TOTAL) BY MOUTH DAILY., Disp: 90 tablet, Rfl: 2   Past Medical History  Diagnosis Date  . Hypertension   . Varicose veins of lower extremities   . Hypercholesterolemia   . GERD (gastroesophageal reflux disease)   . Colonic polyp   . Headache(784.0)   . Anxiety   . Idiopathic urticaria     No Known Allergies  Review of Systems Constitutional:   No  weight loss, night sweats,  Fevers, chills, fatigue, or  lassitude.  HEENT:   No headaches,  Difficulty swallowing,  Tooth/dental problems, or  Sore throat,                No sneezing, itching, ear ache, nasal congestion, post nasal drip,   CV:  No chest pain,  Orthopnea, PND, swelling in lower  extremities, anasarca, + dizziness, palpitations, syncope.   GI  No heartburn, indigestion, abdominal pain, nausea, vomiting, diarrhea, change in bowel habits, loss of appetite, bloody stools.   Resp: No shortness of breath with exertion or at rest.  No excess mucus, no productive cough,  No non-productive cough,  No coughing up of blood.  No change in color of mucus.  No wheezing.  No chest wall deformity  Skin: no rash or lesions.  GU: no dysuria, change in  color of urine, no urgency or frequency.  No flank pain, no hematuria   MS:  No joint pain or swelling.  No decreased range of motion.  No back pain.  Psych:  No change in mood or affect. No depression or anxiety.  No memory loss.        Objective:   Physical Exam  BP 124/82 mmHg  Pulse 75  Ht 5\' 2"  (1.575 m)  Wt 133 lb 6.4 oz (60.51 kg)  BMI 24.39 kg/m2  SpO2 98%  Physical Exam:  General- No distress,  A&Ox3 ENT: No sinus tenderness, TM clear, pale nasal mucosa, no oral exudate,no post nasal drip, no LAN Cardiac: S1, S2, regular rate and rhythm, no murmur Chest: No wheeze/ rales/ dullness; no accessory muscle use, no nasal flaring, no sternal retractions Abd.: Soft Non-tender Ext: No clubbing cyanosis, edema Neuro:  normal strength Skin: No rashes, warm and dry Psych: normal mood and behavior  Bevelyn Ngo, AGACNP-BC [facility] Pulmonary/Critical Care Medicine Pager # [phone] 12/22/2015    Assessment & Plan:

## 2015-12-24 ENCOUNTER — Encounter: Payer: Self-pay | Admitting: *Deleted

## 2015-12-24 ENCOUNTER — Encounter: Payer: Self-pay | Admitting: Diagnostic Neuroimaging

## 2015-12-24 ENCOUNTER — Ambulatory Visit (INDEPENDENT_AMBULATORY_CARE_PROVIDER_SITE_OTHER): Payer: Federal, State, Local not specified - PPO | Admitting: Diagnostic Neuroimaging

## 2015-12-24 VITALS — BP 194/91 | HR 76 | Ht 62.5 in | Wt 132.8 lb

## 2015-12-24 DIAGNOSIS — R930 Abnormal findings on diagnostic imaging of skull and head, not elsewhere classified: Secondary | ICD-10-CM | POA: Diagnosis not present

## 2015-12-24 DIAGNOSIS — D329 Benign neoplasm of meninges, unspecified: Secondary | ICD-10-CM

## 2015-12-24 DIAGNOSIS — R42 Dizziness and giddiness: Secondary | ICD-10-CM

## 2015-12-24 DIAGNOSIS — R9089 Other abnormal findings on diagnostic imaging of central nervous system: Secondary | ICD-10-CM

## 2015-12-24 MED ORDER — LEVETIRACETAM 500 MG PO TABS: 500.0000 mg | ORAL_TABLET | Freq: Two times a day (BID) | ORAL | Status: DC

## 2015-12-24 NOTE — Patient Instructions (Signed)
Thank you for coming to see [place] at [facility]. I hope we have been able to provide you high quality care today.  You may receive a patient satisfaction survey over the next few weeks. We would appreciate your feedback and comments so that we may continue to improve ourselves and the health of our patients.  - buy blood pressure machine and record home BP at rest and with any dizzy episodes - I will check EEG - continue levetiracetam   ~~~~~~~~~~~~~~~~~~~~~~~~~~~~~~~~~~~~~~~~~~~~~~~~~~~~~~~~~~~~~~~~~  DR. Mescal Flinchbaugh'S GUIDE TO HAPPY AND HEALTHY LIVING These are some of my general health and wellness recommendations. Some of them may apply to you better than others. Please use common sense as you try these suggestions and feel free to ask me any questions.   ACTIVITY/FITNESS Mental, social, emotional and physical stimulation are very important for brain and body health. Try learning a new activity (arts, music, language, sports, games).  Keep moving your body to the best of your abilities. You can do this at home, inside or outside, the park, community center, gym or anywhere you like. Consider a physical therapist or personal trainer to get started. Consider the app Sworkit. Fitness trackers such as smart-watches, smart-phones or Fitbits can help as well.   NUTRITION Eat more plants: colorful vegetables, nuts, seeds and berries.  Eat less sugar, salt, preservatives and processed foods.  Avoid toxins such as cigarettes and alcohol.  Drink water when you are thirsty. Warm water with a slice of lemon is an excellent morning drink to start the day.  Consider these websites for more information The Nutrition Source (https://www.henry-hernandez.biz/) Precision Nutrition (WindowBlog.ch)   RELAXATION Consider practicing mindfulness meditation or other relaxation techniques such as deep breathing, prayer, yoga, tai chi, massage.  See website mindful.org or the apps Headspace or Calm to help get started.   SLEEP Try to get at least 7-8+ hours sleep per day. Regular exercise and reduced caffeine will help you sleep better. Practice good sleep hygeine techniques. See website sleep.org for more information.   PLANNING Prepare estate planning, living will, healthcare POA documents. Sometimes this is best planned with the help of an attorney. Theconversationproject.org and agingwithdignity.org are excellent resources.

## 2015-12-24 NOTE — Progress Notes (Signed)
[facility]  PATIENT: [name] [name] DOB: October 03, 1950  REFERRING CLINICIAN: Lynden Oxford HISTORY FROM: patient  REASON FOR VISIT: new consult    HISTORICAL  CHIEF COMPLAINT:  Chief Complaint  Patient presents with  . Dizziness    rm 7, New Pt , hospital FU, "waves of dizziness, increasing in freq; no LOC; on 3/28 I couldn't stand up, called 911, EMS said BP 220/180"  . Meningioma    HISTORY OF PRESENT ILLNESS:   65 year old right-handed female here for evaluation of dizziness, elevated blood pressure, meningioma, possible seizure.  Patient has history of hypertension and is on medication. Over the past several months patient has been having intermittent waves of dizzy spells, while sitting, standing or exerting herself. She has never had loss of consciousness or spinning sensation. She describes a lightheaded sensation and the wave of funny feeling that moves from the left to the right side of her head. On 12/15/15 patient had a severe attack with nausea and vomiting. She felt like she was dying and was able to call 911 and unlock the door. Blood pressure was noted to be 220/180 patient was taken to the hospital for evaluation.  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 Neurology consult was obtained and patient was recommended to start levetiracetam for seizure prevention. Patient also has follow-up with neurosurgery for follow-up of this possible meningioma.  Since discharge patient is feeling better. She has not returned to work. She is not driving. She does not have a blood pressure machine at home.    REVIEW OF SYSTEMS: Full 14 system review of systems performed and negative with exception of: Diarrhea runny nose dizziness.  ALLERGIES: No Known Allergies  HOME MEDICATIONS: Outpatient Prescriptions Prior to Visit  Medication Sig Dispense Refill  . aspirin 81 MG tablet Take 81 mg by mouth daily.      . Cholecalciferol (VITAMIN D-3)  1000 UNITS CAPS Take 1 capsule by mouth daily. Take 5000 units daily    . estrogens, conjugated, (PREMARIN) 0.3 MG tablet Take 0.3 mg by mouth daily. Take daily for 21 days then do not take for 7 days.     Marland Kitchen losartan (COZAAR) 100 MG tablet TAKE 1 TABLET (100 MG TOTAL) BY MOUTH DAILY. 90 tablet 2  . levETIRAcetam (KEPPRA) 500 MG tablet Take 1 tablet (500 mg total) by mouth 2 (two) times daily. 30 tablet 0   No facility-administered medications prior to visit.    PAST MEDICAL HISTORY: Past Medical History  Diagnosis Date  . Hypertension   . Varicose veins of lower extremities   . Hypercholesterolemia   . GERD (gastroesophageal reflux disease)   . Colonic polyp   . Headache(784.0)   . Anxiety   . Idiopathic urticaria     PAST SURGICAL HISTORY: Past Surgical History  Procedure Laterality Date  . Vaginal hysterectomy    . Bilateral breast implants    . Benign breast biopsy    . Varicose vein    . Cataract right eye w/ lens implant    . Excision of large anal polyp    . Colonoscopy    . Polypectomy      FAMILY HISTORY: Family History  Problem Relation Age of Onset  . Hypertension Mother   . Hyperlipidemia Mother   . Diabetes Mother   . Breast cancer Mother   . Pulmonary embolism Mother   . Deep vein thrombosis Mother   . Colon cancer Neg Hx   . Esophageal  cancer Neg Hx   . Stomach cancer Neg Hx   . Dementia Father   . Sickle cell anemia Cousin     SOCIAL HISTORY:  Social History   Social History  . Marital Status: Single    Spouse Name: N/A  . Number of Children: 0  . Years of Education: 12   Occupational History  .      [employer]   Social History Main Topics  . Smoking status: Never Smoker   . Smokeless tobacco: Not on file  . Alcohol Use: No  . Drug Use: No  . Sexual Activity: Not on file   Other Topics Concern  . Not on file   Social History Narrative   Lives alone   No caffeine use     PHYSICAL EXAM  GENERAL EXAM/CONSTITUTIONAL: Vitals:    Filed Vitals:   12/24/15 1314  BP: 194/91  Pulse: 76  Height: 5' 2.5" (1.588 m)  Weight: 132 lb 12.8 oz (60.238 kg)     Body mass index is 23.89 kg/(m^2).  Visual Acuity Screening   Right eye Left eye Both eyes  Without correction:     With correction: 20/30       Patient is in no distress; well developed, nourished and groomed; neck is supple  CARDIOVASCULAR:  Examination of carotid arteries is normal; no carotid bruits  Regular rate and rhythm, no murmurs  Examination of peripheral vascular system by observation and palpation is normal  EYES:  Ophthalmoscopic exam of optic discs and posterior segments is normal; no papilledema or hemorrhages  MUSCULOSKELETAL:  Gait, strength, tone, movements noted in Neurologic exam below  NEUROLOGIC: MENTAL STATUS:  No flowsheet data found.  awake, alert, oriented to person, place and time  recent and remote memory intact  normal attention and concentration  language fluent, comprehension intact, naming intact,   fund of knowledge appropriate  CRANIAL NERVE:   2nd - no papilledema on fundoscopic exam  2nd, 3rd, 4th, 6th - pupils equal and reactive to light, visual fields full to confrontation, extraocular muscles intact, no nystagmus  5th - facial sensation symmetric  7th - facial strength symmetric  8th - hearing intact  9th - palate elevates symmetrically, uvula midline  11th - shoulder shrug symmetric  12th - tongue protrusion midline  MOTOR:   normal bulk and tone, full strength in the BUE, BLE  SENSORY:   normal and symmetric to light touch, temperature, vibration  COORDINATION:   finger-nose-finger, fine finger movements normal  REFLEXES:   deep tendon reflexes present and symmetric  GAIT/STATION:   narrow based gait; romberg is negative    DIAGNOSTIC DATA (LABS, IMAGING, TESTING) - I reviewed patient records, labs, notes, testing and imaging myself where available.  Lab Results   Component Value Date   WBC 5.5 12/15/2015   HGB 12.9 12/15/2015   HCT 38.5 12/15/2015   MCV 86.5 12/15/2015   PLT 266 12/15/2015      Component Value Date/Time   NA 138 12/15/2015 1020   K 3.4* 12/15/2015 1020   CL 102 12/15/2015 1020   CO2 26 12/15/2015 1020   GLUCOSE 119* 12/15/2015 1020   BUN 11 12/15/2015 1020   CREATININE 0.71 12/15/2015 1020   CALCIUM 9.0 12/15/2015 1020   PROT 7.7 12/15/2015 1020   ALBUMIN 3.9 12/15/2015 1020   AST 42* 12/15/2015 1020   ALT 20 12/15/2015 1020   ALKPHOS 78 12/15/2015 1020   BILITOT 0.7 12/15/2015 1020   GFRNONAA >60 12/15/2015  1020   GFRAA >60 12/15/2015 1020   Lab Results  Component Value Date   CHOL 237* 09/29/2014   HDL 77.70 09/29/2014   LDLCALC 145* 09/29/2014   LDLDIRECT 97.9 10/21/2011   TRIG 71.0 09/29/2014   CHOLHDL 3 09/29/2014   No results found for: HGBA1C No results found for: VITAMINB12 Lab Results  Component Value Date   TSH 1.78 09/29/2014    12/15/15 MRI brain (without) / MRA head (without) [I reviewed images myself and agree with interpretation. -VRP]  1. No acute infarct. 2. Tiny chronic left cerebellar infarct. 3. New 7 mm T2 hyperintense lesion involving anterior right temporal lobe cortex, indeterminate. Postcontrast imaging is recommended to assess for small neoplasm.  4. No major intracranial arterial occlusion or significant stenosis.  12/15/15 MRI brain (with) - Subcentimeter meningioma is demonstrated along the floor of the RIGHT middle cranial fossa.  12/15/15 CT c/a/p 1. Nonspecific small sclerotic foci in the sternum and T5 and L4 vertebral bodies. Consider correlation with a bone scintigraphy study on a short term outpatient basis. 2. Otherwise no potential sites of metastatic disease in the chest, abdomen or pelvis. 3. Minimal tree-in-bud opacity in the right middle lobe without bronchiectasis, favoring a minimal infectious or inflammatory bronchiolitis of doubtful clinical significance. 4.  Mild sigmoid diverticulosis.  12/16/15 EEG  - This is a normal EEG for the patients stated age. There were no focal, hemispheric or lateralizing features. No epileptiform activity was recorded. A normal EEG does not exclude the diagnosis of a seizure disorder and if seizure remains high on the list of differential diagnosis, an ambulatory EEG may be of value. Clinical correlation is required.  12/25/15 EKG [I reviewed images myself and agree with interpretation. -VRP]  - normal sinus rhythm     ASSESSMENT AND PLAN  65 y.o. year old female here with history of hypertension, with new onset episode on 12/15/15 of severe dizziness, lightheadedness, nausea, vomiting associated with elevated blood pressure 220/180. Most likely represents hypertensive emergency. Patient also found to have likely incidental right temporal meningioma. Patient was started empirically on antiseizure medication in the hospital.   Ddx: hypertensive emergency, seizure disorder, meningioma (incidental) vs dural based metastasis  1. Dizziness and giddiness   2. Abnormal finding on MRI of brain   3. Meningioma (HCC)      PLAN: - no driving or work until 1/61/09 or further notice - continue levetiracetam 500mg  BID  - I will check EEG (1 hour); if normal, will taper off levetiracetam - follow up with neurosurgery and MRI brain for meningioma follow up (I think it is likely incidental) - follow up BP readings at home and follow up witht PCP for BP treatment  Orders Placed This Encounter  Procedures  . EEG adult   Meds ordered this encounter  Medications  . levETIRAcetam (KEPPRA) 500 MG tablet    Sig: Take 1 tablet (500 mg total) by mouth 2 (two) times daily.    Dispense:  60 tablet    Refill:  6   Return in about 1 month (around 01/23/2016).  I reviewed images, labs, notes, records myself. I summarized findings and reviewed with patient, for this high risk condition (dizziness; hypertensive emergency;  meningioma) requiring high complexity decision making.    Suanne Marker, MD 12/24/2015, 2:24 PM Certified in Neurology, Neurophysiology and Neuroimaging  [facility] [address] [place], [place] [address] [phone]

## 2015-12-29 ENCOUNTER — Encounter (HOSPITAL_COMMUNITY)
Admission: RE | Admit: 2015-12-29 | Discharge: 2015-12-29 | Disposition: A | Discharge status: Home / Self Care | Payer: Federal, State, Local not specified - PPO | Source: Ambulatory Visit | Attending: Acute Care | Admitting: Acute Care

## 2015-12-29 DIAGNOSIS — D329 Benign neoplasm of meninges, unspecified: Secondary | ICD-10-CM

## 2015-12-30 ENCOUNTER — Ambulatory Visit (INDEPENDENT_AMBULATORY_CARE_PROVIDER_SITE_OTHER): Payer: Federal, State, Local not specified - PPO | Admitting: Diagnostic Neuroimaging

## 2015-12-30 DIAGNOSIS — R55 Syncope and collapse: Secondary | ICD-10-CM

## 2015-12-30 DIAGNOSIS — R42 Dizziness and giddiness: Secondary | ICD-10-CM

## 2016-01-01 NOTE — Procedures (Signed)
[facility]  EEG (ELECTROENCEPHALOGRAM) REPORT   STUDY DATE: 12/30/15 PATIENT NAME: [name] [name] DOB: [birth year] MRN: [id number]  ORDERING CLINICIAN: Joycelyn Schmid, MD   TECHNOLOGIST: Gearldine Shown  TECHNIQUE: Electroencephalogram was recorded utilizing standard 10-20 system of lead placement and reformatted into average and bipolar montages.  RECORDING TIME: 65 minutes ACTIVATION: hyperventilation and photic stimulation  CLINICAL INFORMATION: 65 year old female with right temporal meningioma and dizziness.  FINDINGS: Background rhythms of 11-12 hertz and 15-20 microvolts. No focal, lateralizing, epileptiform activity or seizures are seen. Patient recorded in the awake, drowsy and asleep state. EKG channel shows 60-70 beats per minute.  IMPRESSION:  Normal EEG in the awake and asleep states.    INTERPRETING PHYSICIAN:  Suanne Marker, MD Certified in Neurology, Neurophysiology and Neuroimaging  [facility] [address], [address] [place], [place] [address] [phone]

## 2016-01-05 ENCOUNTER — Telehealth: Payer: Self-pay | Admitting: Pulmonary Disease

## 2016-01-05 MED ORDER — NEBIVOLOL HCL 10 MG PO TABS: 10.0000 mg | ORAL_TABLET | Freq: Every day | ORAL | Status: DC

## 2016-01-05 NOTE — Telephone Encounter (Signed)
Per Dr. Lenna Gilford:  Need to add on Bystolic 10mg  qd #30, Refill: PRN; this is the best for add to Losartan.  Keep 01/19/2016 appointment.  --------------------------------- Patient aware of Dr. Jeannine Kitten recommendations. rx sent to pharmacy. Nothing further needed.

## 2016-01-05 NOTE — Telephone Encounter (Signed)
Called, spoke with pt.  Pt states she was in the hospital March 28 d/t dizziness and elevated BP.  Report BP has improved some but still elevate.  Reports, on average, BP has been 160-190/90s when she is checking it.  Was seen by neurologist on 12/24/15 with BP of 194/91.  Yesterday was visiting father in hospital and asked PT to check BP.  Reports it was 201/85 sitting and 192/99 standing.  Today was seen by neruosurgery with BP of 186/81.  Pt reports she was advised by neurologist and neurosurgeon dizziness could be coming form BP and was advised to check back in with PCP.  Pt states dizziness has improved since hospital but still present.  Denies HA, blurred vision, or syncope.  Is taking losartan 100 mg qd at same time each day.  Requesting further recs.  Pt has a pending appt with SN on 01/19/16. SN, please advise.  Thank you.

## 2016-01-06 ENCOUNTER — Telehealth: Payer: Self-pay | Admitting: Pulmonary Disease

## 2016-01-06 NOTE — Telephone Encounter (Signed)
Glean Hess, CMA at 01/05/2016 4:48 PM     Status: Signed       Expand All Collapse All   Per Dr. Lenna Gilford:  Need to add on Bystolic 10mg  qd #30, Refill: PRN; this is the best for add to Losartan. Keep 01/19/2016 appointment.  --------------------------------- Patient aware of Dr. Jeannine Kitten recommendations. rx sent to pharmacy. Nothing further needed.       Spoke with pt. She is aware of the amount of medication she should be taking. Nothing further was needed.

## 2016-01-07 ENCOUNTER — Telehealth: Payer: Self-pay | Admitting: Diagnostic Neuroimaging

## 2016-01-07 NOTE — Telephone Encounter (Signed)
Pt sts Dr Leta Baptist was to compare EEG results from hospital with EEG results from [place] (done on 12/30/15) and call her. She is inquiring if levETIRAcetam (KEPPRA) 500 MG tablet dose will change. She said PCP has added bystolic 10mg  1 in the am/day, starting yesterday (01/06/16).

## 2016-01-08 NOTE — Telephone Encounter (Signed)
EEG normal. I will plan to taper patient off levetiracetam at next office visit, but continue levetiracetam for now. -VRP

## 2016-01-08 NOTE — Telephone Encounter (Signed)
I called patient. Advised her to continue current medication until next visit. No driving until then either. Poor phone connection, but I think patient was able to understand. Will clarify further at next visit.   Penni Bombard, MD [id number], [id number] PM Certified in Neurology, Neurophysiology and Neuroimaging  [facility] [address], [place], [place] [address] [phone]

## 2016-01-08 NOTE — Telephone Encounter (Signed)
Spoke with patient and informed her, per Dr Leta Baptist, her EEG was normal, and she is to continue taking levetiracetam as ordered. He will plan to taper her off at next office visit on 02/04/16. She then inquired if she may drive and go back to work; advised that his OV note stated no driving or work until 01/13/16 or further notice. She would like to know if Dr Leta Baptist is going to change that date. Informed her would route her question to him and call her back before office closes today., she verbalized understanding.

## 2016-01-19 ENCOUNTER — Encounter: Payer: Self-pay | Admitting: Pulmonary Disease

## 2016-01-19 ENCOUNTER — Ambulatory Visit (INDEPENDENT_AMBULATORY_CARE_PROVIDER_SITE_OTHER): Payer: Federal, State, Local not specified - PPO | Admitting: Pulmonary Disease

## 2016-01-19 ENCOUNTER — Encounter (INDEPENDENT_AMBULATORY_CARE_PROVIDER_SITE_OTHER): Payer: Self-pay

## 2016-01-19 VITALS — BP 160/80 | HR 56 | Temp 97.7°F | Ht 63.0 in | Wt 132.2 lb

## 2016-01-19 DIAGNOSIS — R0989 Other specified symptoms and signs involving the circulatory and respiratory systems: Secondary | ICD-10-CM

## 2016-01-19 DIAGNOSIS — E7800 Pure hypercholesterolemia, unspecified: Secondary | ICD-10-CM | POA: Diagnosis not present

## 2016-01-19 DIAGNOSIS — R42 Dizziness and giddiness: Secondary | ICD-10-CM | POA: Diagnosis not present

## 2016-01-19 DIAGNOSIS — D329 Benign neoplasm of meninges, unspecified: Secondary | ICD-10-CM

## 2016-01-19 DIAGNOSIS — I10 Essential (primary) hypertension: Secondary | ICD-10-CM

## 2016-01-19 MED ORDER — AMLODIPINE BESYLATE 5 MG PO TABS: 5.0000 mg | ORAL_TABLET | Freq: Every day | ORAL | Status: DC

## 2016-01-19 NOTE — Patient Instructions (Signed)
Today we updated your med list in our EPIC system...    Continue your current medications the same...  We decided to add AMLODIPINE 5mg  one tab daily to your current BP meds= 0000000 and Bystolic10...  We are going to evaluate the elev BP further w/ a CT ANGIOGRAM of the Abd to check the Aorta & branch vessels for any sign of blockage-    (a blockage to the renal arteries could be a secondary cause of elev BP)    We will contact you w/ the results when available...   In the meanwhile>  Remember to avoid salt or sodium, do NOT take any decongestants like pseudophed etc, & avoid caffeine...  Call for any questions...  Let's plan a follow up visit in 32mo to recheck your BP & see how you are doing.Marland KitchenMarland Kitchen

## 2016-01-19 NOTE — Progress Notes (Signed)
Subjective:     Patient ID: [name] [name], female   DOB: December 19, 1950, 65 y.o.   MRN: [id number]  HPI 65 y/o BF here for a follow up visit... she has mult med problems as noted....  ~  SEE PREV EPIC NOTES FOR OLDER DATA >>   ~  July 14, 2010:  she has had a good yr- no new complaints or concerns, just incr stress w/ mother... her insurance comp changed her Avalide to Avapro + HCTZ for some reason- BP remains well controlled;  she has hx sl elev TChol/ LDL but good HDL & she doesn't want meds Rx (committed to diet alone);  weight= 126# is down 9# in the last yr;  GI is stable & she is due for 43yr f/u colon- refer to [name] [name];  she is on Premarin, black cohosh, calcium, vits per GYN... OK 2011 Flu vaccine & TDAP booster today...  ~  September 27, 2011:  Yearly ROV & CPX> [name] [name] reports a good yr w/o new complaints or concerns & she has a new Huskie dog "Ridley";  She saw TP 9/12 w/ fatigue & labs showed K=3.0 (due to HCTZ), supplemented & feels better; we discussed checking CXR (she didn't go to basement for this film), EKG (NSR, rate60, WNL), & ret for fasting labs (she never did- we will call w/ reminder)... See Prob List>>  ~  January 01, 2013:  77mo ROV & CPX> & [name] [name] is stable, good interval w/o new complaints or concerns- just painful left bubion & we discussed referral to Podiatry when she is ready... We reviewed the following medical problems during today's office visit >>     HBP> on ASA81, Avapro150, HCT12.5, K20; BP= 138/78 & she denies CP, palpit, dizzy, SOB, edema, etc...    VV> she knows to elim sodium, elev legs, wear support hose...    CHOL> on diet alone; last FLP 2/13 showed TChol 205, TG 27, HDL 92, LDL 98; she is not fasting for blood work today...    GI- GERD, Polyps> on OTC Prilosec vs Zantac prn; f/u colon 11/12 by [name] [name] was neg & he rec f/u 58yrs...    GYN> on Premarin0.3 and BlackCohosh per [facility]; she is s/p hyst...    Anxiety> on Ativan 0.5mg - 1/2 to 1 Tid prn;  stress w/ care of motherLorenza [name]...    Hx idiopathic urticaria> she believed this was due to wt lifting in the past; no recurrence... We reviewed prob list, meds, xrays and labs> see below for updates >> she had the 2013 Flu vaccine 10/13...  LABS 4/14:  Chems- wnl w/ K=3.6;  CBC- wnl;  TSH=1.90;  VitD=32 & rec to take 1000u OTC supplement daily...  ~  July 28, 2014:  56mo ROV & review of medical problems> [name] [name] states that she has been doing fine- feels well & asymptomatic but she ran out of her BP meds ~84mo ago & never called for a refill; now she notes that home BP check are running in the 170/90 range and BP confirmed 180/90 today; we discussed need to call the office anytime BP is elev, she has questions about meds/ what to do/ refills/ etc... We reviewed the following medical problems during today's office visit >>     HBP> on ASA81 & ran out of Avapro150/ HCT12.5/ K20- all ~8mo ago; BP= 180/90 & she denies CP, palpit, dizzy, SOB, edema, etc; we decided to change to LOSARTAN100mg /d, low sodium, etc...    VV> she knows  to elim sodium, elev legs, wear support hose; denies pain, swelling, leg lesions...    CHOL> on diet alone; last FLP 2/13 showed TChol 205, TG 27, HDL 92, LDL 98; she is not fasting for blood work today...    GI- GERD, Polyps> on OTC Prilosec vs Zantac prn; f/u colon 11/12 by [name] [name] was neg & he rec f/u 32yrs...    GYN> on Premarin0.3 and BlackCohosh per Andree Coss (seen yearly); she is s/p hyst; mammograms neg at [facility] annually (last 6/15)...    Anxiety> on Ativan 0.5mg - 1/2 to 1 Tid prn; stress w/ care of motherLorenza [name]; med refilled...    Hx idiopathic urticaria> she believed this was due to wt lifting in the past; no recurrence... We reviewed prob list, meds, xrays and labs> see below for updates >> OK 2015 Flu vaccine today, she will need Prevnar-13 in follow up...  CXR 11/15 showed norm heart size, clear lungs, NAD.Marland KitchenMarland Kitchen  EKG 11/15 showed NSR,  rate71, wnl, NAD... PLAN>>  She is not fasting today for blood work; we reviewed the importance of staying on meds & the need to call us for any questions (or refills if Pharm won't co-operate); REC changing to LOSARTAN100mg /d, no salt, monitor BP at home & call for questions; we plan ROV 40mo w/ FASTING labs and BP check...   ~  September 29, 2014:  40mo ROV & [name] [name] is here for a follow up on her BP- now back on meds= Cozaar100 + low salt diet etc;  She notes that she has not yet taken today's dose & BP here is 142/90; she has not been checking her BP at home; med tolerated well & she is feeling well w/o CP/ palpit/ SOB/ edema/ etc... She did her FASTING blood work today (see below)...    CHOL> on diet alone; FLP 1/16 showed TChol 237, TG 71, HDL 78, LDL 145; this is the worst she has had & rec to start Simva40- take 1/2 tab daily...    Vit D Defic> Labs 1/16 showed Vit D level = 17 & rec to start OTC VitD supplement ~5000u daily... We reviewed prob list, meds, xrays and labs> see below for updates >>   LABS 1/16:  FLP- not at goals on diet alone w/ LDL=145;  Chems- ok x K=3.3 & rec dietary supplement;  CBC- wnl;  TSH=1.78;  VitD=17...  ~  March 30, 2015:  30mo ROV & [name] [name] reports doing well, BP controlled on her regular Cozaar100 (tol well) w/ BP=134/70 today & she denies HA, visual sx, CP, palpit, SOB, edema, etc... She decided NOT to restart the Simva20 & wants to control her Chol w/ diet + exercise (we reviewed these today...  Finally she has continued the VitD supplement 5000u daily... EXAM shows Afeb, VSS, O2sat=99% on RA;  HEENT- neg, mallampati1;  Chest- clear w/o w/r/r;  Heart- RR w/o m/r/g;  Abd- neg- soft, nondender;  Ext- w/o c/c/e;  Neuro- intact... We reviewed prob list, meds, xrays and labs>  PLAN>>  [name] [name] is stable on her diet + Cozaar100, ASA81, VitD5000; we reviewed diet & exercise program...   ~  September 30, 2015:  30mo ROV & annual CPX>  [name] [name] reports doing well, feels good, no new  complaints or concerns;  She remains active walking her [place] Husky "[name]" 16mi/d;  We reviewed the following medical problems during today's office visit >>     HBP> on ASA81 & LOSAR100; BP= 138/64 & she denies CP, palpit, dizzy,  SOB, edema, etc; continue same & low sodium...    VV> prev eval at vein clinic DrFeatherston w/ surg; she knows to elim sodium, elev legs, wear support hose; denies pain, swelling, leg lesions...    CHOL> on diet alone; last FLP 1/16 showed TChol 237, TG 71, HDL 78, LDL 145; we reviewed low chol/ low fat diet...    GI- GERD, Polyps> on OTC Prilosec vs Zantac prn; f/u colon 11/12 by [name] [name] was neg & he rec f/u 81yrs...    GYN> on Premarin0.3 and BlackCohosh per Andree Coss (seen yearly); she is s/p hyst; mammograms neg at [facility] annually (last 10/16)...    VitD defic> VitD level 1/16 = 17 & asked to incr OTC supplement to 5000u daily but she didn't do it...     Anxiety> on Ativan 0.5mg - 1/2 to 1 Tid prn; stress w/ care of motherLorenza [name]; med refilled...    Hx idiopathic urticaria> she believed this was due to wt lifting in the past; no recurrence... EXAM shows Afeb, VSS, O2sat=100% on RA;  HEENT- neg, mallampati2;  Chest- clear w/o w/r/r;  Heart- RR w/o m/r/g;  Abd- soft, neg;  Ext- neg w/o c/c/e;  Neuro- intact...  FASTING LABS>  She is not fasting today & will ret for labs at her convenience...  IMP/PLAN>>  Soniyah is stable & rec to continue same meds, ret for FLabs, consider low dose statin med for her lipids (she declines)...  ~  Jan 19, 2016:  37mo ROV & post hospital visit>  [name] [name] was [facility] 3/28 - 12/16/15 by [facility] w/ dizziness of several wks duration, developed weakness & fell (called 911); no HA, no LOC, focal weakness, speech disturbance, etc; In the ER her exam was NEG & an MRI Brain showed a sm 7mm T2enhancing lesion in right temporal lobe cortex c/w meningioma, no acute infarct, tiny chr left cbll infarct;  Neuro was consulted & they wondered if the  dizziness may be an epileptiform seizure & started Keppra rx=> EEG was NEG; Neuro wanted [facility] to do a ?metastatic? work-up- this was NEG as well (see below); Disch on Keppra500Bid for Neuro follow up...    In the interim the pt had some milder dizziness but noted that BP was also elev at home & at [facility] office ~190/90 & he felt that the BP likely caused the dizziness, not a seizure, and the meningioma is likely incidental; pt has f/u appt w/ NS, DrDitty (she indicates that they will follow this lesion);  She called w/ BP elev & we called in additional meds adding Bystolic10 to her Losartan100... EXAM shows Afeb, BP=160/80, O2sat=98% on RA;  BP recently elev from 120/80 to 190/100, HEENT- neg, mallampati2;  Chest- clear w/o w/r/r;  Heart- RR w/o m/r/g;  Abd- soft, +faint abd bruit heard;  Ext- neg w/o c/c/e;  Neuro- intact...  LABS 11/2015> reviewed  CT Chest was neg- showing norm heart size, no adenopathy, no PE seen; no consolidative airsp dis/ nodules/ or masses, no effusion etc; mild degen changes in Tspine, bilat breast prosthesis...  CT Abd&Pelvis showed norm liver/ GB/ pancreas/ spleen; normal kidneys, adrenals, bladder; stomach & bowels ok w/ mild divertics, s/p hyst, no adenopathy; nonspecific sclerotic foci in sternum, T5 & L4=> rec bone scan.  Nuclear Med Bone scan 12/29/15> NEG, no abn uptake... IMP/PLAN>>  She has had signif elev in BP along w/ dizziness & the weak spell that resulted in ER visit & 1d Hosp; seems more likely that BP caused  the dizziness & hopefully the 7mm meningioma is just incidental (she tells me that NS, DrDitty will be following this lesion);  Her BP has been easily controlled in the past & just suddenly elev & requiring increased medication, coupled w/ Abd bruit on exam=> proceed w/ CTA of abd & pelvis to check Ao & bilat renal arteries... For now we will Add Amlodipine5 to her regimen...  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 We will refer to Endovascular specialist for further eval.           Problem List:  HYPERTENSION (ICD-401.9) -  ~  Prev on AVAPRO 150mg /d + HCTZ 12.5mg /d & K20 added 9/12 (taking 1/2 tab daily she says) w/ good control...  ~  10/11:  BP= 126/78, tolerating rx well... denies HA, fatigue, visual changes, CP, palipit, dizziness, syncope, dyspnea, edema, etc... ~  1/13:  BP= 132/80 & her fatigue is resolved, feeling better, currently asymptomatic... ~  4/14: on ASA81, Avapro150, HCT12.5, K20; BP= 138/78 & she denies CP, palpit, dizzy, SOB, edema, etc. ~  11/15: on ASA81 & ran out of Avapro150/ HCT12.5/ K20- all ~24mo ago; BP= 180/90 & she denies CP, palpit, dizzy, SOB, edema, etc; we decided to change to LOSARTAN100mg /d, low sodium, etc. ~  1/16: on ASA81 & Cozaar100; BP= 142/90 but hasn't taken meds today; remains asymptomatic; reminded to take med daily in AM... ~  7/16: BP controlled on her regular Cozaar100 (tol well) w/ BP=134/70 today & she denies HA, visual sx, CP, palpit, SOB, edema, etc... ~  1/17: on ASA81 & LOSAR100; BP= 138/64 & she denies CP, palpit, dizzy, SOB, edema, etc; continue same & low sodium. 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 refewrred to [facility] Endovasc specialist for further eval.  VARICOSE VEINS, LOWER EXTREMITIES (ICD-454.9) - she has been eval at the Vein clinic and had [facility] surg by DrFeatherston (sclerotherapy  w/o help she says)... she avoids sodium, elevates legs, wears support hose...  HYPERCHOLESTEROLEMIA (ICD-272.0) - on diet alone and has a high HDL... ~  last FLP was 6/05 showing TChol 236, Tg 148, HDL 83, LDL 125... I told her she may need a low dose statin med to get her TChol & LDL to goal... ~  FLP 10/10 showed TChol 222, TG 63, HDL 92, LDL 118... rec better diet effort. ~  FLP 1/13 on diet alone showed  TChol 205, TG 27, HDL 92, LDL 98 ~  FLP 1/16 on diet alone showed TChol 237, TG 71, HDL 78, LDL 145... rec to start [facility]- 1/2 tab daily (she chose not to & prefers diet alone)... ~  FLP 1/17 on diet alone - TChol 237, TG 71, HDL 78, LDL 145 and she declines to consider med rx, prefers diet alone despite these numbers...  GERD (ICD-530.81) - occas reflux symptoms treated w/ Prilosec vs Zantac OTC...  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 follow up planned 67yrs & we will refer to GI for this important screening procedure. ~  F/u colonoscopy done 11/12 by [name] [name] was neg> no polyps or cancers, no rectal mass, f/u suggested in 63yrs...  HEADACHE (ICD-784.0) - she believes related to stress...  VITAMIN D DEFICIENCY >> ~  Labs 4/14 showed Vit D level = 32 and she is rec to start OTC Vit D supplement ~1000u daily (she didn't do it). ~  Labs 1/16 showed Vit D level = 17 and she is rec to start OTC Vit D supplement = 5000u daily...  MENINGIOMA >> MRI Brain 11/2015 showed a sm 7mm T2enhancing lesion in right temporal lobe cortex c/w meningioma, no acute infarct, tiny chr left cbll infarct;  Neuro was consulted & they wondered if the dizziness may be an epileptiform seizure & started Keppra rx=> EEG was NEG; Neuro wanted [facility] to do a ?metastatic? work-up- this was NEG as well (see below); Disch on Keppra500Bid for Neuro follow up & DrPenumalli felt the meningioma was prob incidental, pt  indcatres that she will be followed by NS, DrDitty...  ANXIETY (ICD-300.00) - she uses LORAZEPAM 0.5mg  as needed for anxiety... eg. during storms etc...  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  Health Maintenance:  Also takes ASA 81mg /d... GYN= DrHorvath, on Premarin 0.3mg /d, s/p hysterectomy... Mammograms yearly at [facility] (she has implants)- and they follow closely... she gets the seasonal Flu vaccine yearly... given TDAP booster 10/11...   Past Surgical History  Procedure Laterality Date  . Vaginal hysterectomy    . Bilateral breast implants    . Benign breast biopsy    . Varicose vein    . Cataract right eye w/ lens implant    . Excision of large anal polyp    . Colonoscopy    . Polypectomy      Outpatient Encounter Prescriptions as of 01/19/2016  Medication Sig  . aspirin 81 MG tablet Take 81 mg by mouth daily.    . Cholecalciferol (VITAMIN D-3) 1000 UNITS CAPS Take 1 capsule by mouth daily. Take 5000 units daily  . estrogens, conjugated, (PREMARIN) 0.3 MG tablet Take 0.3 mg by mouth daily. Take daily for 21 days then do not take for 7 days.   Marland Kitchen levETIRAcetam (KEPPRA) 500 MG tablet Take 1 tablet (500 mg total) by mouth 2 (two) times daily.  Marland Kitchen losartan (COZAAR) 100 MG tablet TAKE 1 TABLET (100 MG TOTAL) BY MOUTH DAILY.  . nebivolol (BYSTOLIC) 10 MG tablet Take 1 tablet (10 mg total) by mouth daily.  Marland Kitchen amLODipine (NORVASC) 5 MG tablet Take 1 tablet (5 mg total) by mouth daily.   No facility-administered encounter medications on file as of 01/19/2016.    No Known Allergies    Immunization History  Administered Date(s) Administered  . Influenza Split 06/13/2011, 07/19/2012  . Influenza Whole 07/14/2009, 07/14/2010  . Influenza,inj,Quad PF,36+ Mos 08/13/2013, 07/28/2014, 08/25/2015  . Td 07/14/2010    Current Medications, Allergies, Past Medical History, Past Surgical History, Family History, and Social  History were reviewed in [employer] record.    Review of Systems       See HPI - all other systems neg except as noted...  The patient denies anorexia, fever, weight loss, weight gain, vision loss, decreased hearing, hoarseness, chest pain, syncope, dyspnea on exertion, peripheral edema, prolonged cough, headaches, hemoptysis, abdominal  pain, melena, hematochezia, severe indigestion/heartburn, hematuria, incontinence, muscle weakness, suspicious skin lesions, transient blindness, difficulty walking, depression, unusual weight change, abnormal bleeding, enlarged lymph nodes, and angioedema.    Objective:   Physical Exam     WD, WN, 64 y/o BF in NAD... GENERAL:  Alert & oriented; pleasant & cooperative... HEENT:  [place]/AT, EOM-wnl, PERRLA, EACs-clear, TMs-wnl, NOSE-clear, THROAT-clear & wnl. NECK:  Supple w/ full ROM; no JVD; normal carotid impulses w/o bruits; no thyromegaly or nodules palpated; no lymphadenopathy. CHEST:  Clear to P & A; without wheezes/ rales/ or rhonchi. HEART:  Regular Rhythm; without murmurs/ rubs/ or gallops. ABDOMEN:  Soft & nontender; normal bowel sounds & faint abd bruit heard; no organomegaly or masses detected. EXT: without deformities or arthritic changes; no varicose veins/ venous insuffic/ or edema. NEURO:  CN's intact; motor testing normal; sensory testing normal; gait normal & balance OK. DERM:  No lesions noted; no rash etc...  RADIOLOGY DATA:  Reviewed in the EPIC EMR & discussed w/ the patient...  LABORATORY DATA:  Reviewed in the EPIC EMR & discussed w/ the patient...   Assessment:      HBP> started on LOSARTAN100 11/15 and BP improved on Rx; not checking BP at home & reminded to do so; f/u BP 2017 as high has 200/100 and meds added=> Bystolic10 & Amlod5 w/ good control... 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  VV, VI>  Prev eval at the Vein clinic  w/ surg by DrFeatherston...  CHOL>  On diet alone & FLP has not been at goal- she will ret for FLP soon...  GI> GERD, Polyps>  She uses OTC acid suppressors & had f/u colonoscopy 11/12 (neg)...  Hx HAs>  She denies recent HAs etc...  Vit D Defic>  She was not taking VitD supplements as requested; labs 1/16 showed Vit D level = 17 & she s rec to start VitD 5000u daily but she reverted to OTC 1000u supplement!  MENINGIOMA>>  7mm right temp lobe lesion eval by Neuro & NS 11/2015 Hosp... They are following.  Anxiety>  She uses Lorazepam as needed & requests refill of this med...     Plan:     Patient's Medications  New Prescriptions   AMLODIPINE (NORVASC) 5 MG TABLET    Take 1 tablet (5 mg total) by mouth daily.  Previous Medications   ASPIRIN 81 MG TABLET    Take 81 mg by mouth daily.     CHOLECALCIFEROL (VITAMIN D-3) 1000 UNITS CAPS    Take 1 capsule by mouth daily. Take 5000 units daily   ESTROGENS, CONJUGATED, (PREMARIN) 0.3 MG TABLET    Take 0.3 mg by mouth daily. Take daily for 21 days then do not take for 7 days.    LEVETIRACETAM (KEPPRA) 500 MG TABLET    Take 1 tablet (500 mg total) by mouth 2 (two) times daily.   LOSARTAN (COZAAR) 100 MG TABLET    TAKE 1 TABLET (100 MG TOTAL) BY MOUTH DAILY.   NEBIVOLOL (BYSTOLIC) 10 MG TABLET    Take 1 tablet (10 mg total) by mouth daily.  Modified Medications   No medications on file  Discontinued Medications   No medications on file

## 2016-01-22 ENCOUNTER — Ambulatory Visit (HOSPITAL_COMMUNITY)
Admission: RE | Admit: 2016-01-22 | Discharge: 2016-01-22 | Disposition: A | Discharge status: Home / Self Care | Payer: Federal, State, Local not specified - PPO | Source: Ambulatory Visit | Attending: Pulmonary Disease | Admitting: Pulmonary Disease

## 2016-01-22 ENCOUNTER — Other Ambulatory Visit: Payer: Self-pay | Admitting: Pulmonary Disease

## 2016-01-22 DIAGNOSIS — R0989 Other specified symptoms and signs involving the circulatory and respiratory systems: Secondary | ICD-10-CM

## 2016-01-22 DIAGNOSIS — I7789 Other specified disorders of arteries and arterioles: Secondary | ICD-10-CM | POA: Insufficient documentation

## 2016-01-22 DIAGNOSIS — I10 Essential (primary) hypertension: Secondary | ICD-10-CM | POA: Insufficient documentation

## 2016-01-22 MED ORDER — IOPAMIDOL (ISOVUE-370) INJECTION 76%
100.0000 mL | Freq: Once | INTRAVENOUS | Status: AC | PRN
  Administered 2016-01-22: 100 mL via INTRAVENOUS

## 2016-01-26 ENCOUNTER — Other Ambulatory Visit: Payer: Self-pay

## 2016-01-26 DIAGNOSIS — I773 Arterial fibromuscular dysplasia: Secondary | ICD-10-CM

## 2016-01-27 ENCOUNTER — Encounter: Payer: Self-pay | Admitting: Vascular Surgery

## 2016-01-29 ENCOUNTER — Encounter: Payer: Self-pay | Admitting: Vascular Surgery

## 2016-01-29 ENCOUNTER — Ambulatory Visit (INDEPENDENT_AMBULATORY_CARE_PROVIDER_SITE_OTHER): Payer: Federal, State, Local not specified - PPO | Admitting: Vascular Surgery

## 2016-01-29 VITALS — BP 172/78 | HR 69 | Temp 97.7°F | Resp 16 | Ht 62.0 in | Wt 132.0 lb

## 2016-01-29 DIAGNOSIS — I7789 Other specified disorders of arteries and arterioles: Secondary | ICD-10-CM

## 2016-01-29 DIAGNOSIS — I773 Arterial fibromuscular dysplasia: Secondary | ICD-10-CM

## 2016-01-29 NOTE — Progress Notes (Signed)
[facility] [place]  Patient name: [name] [name] MRN: [id number] DOB: [birth year] Sex: female  REASON FOR CONSULT: Renal artery fibromuscular dysplasia. Referred by [facility]  HPI: [name] [name] is a 65 y.o. female, who is referred for evaluation of fibromuscular dysplasia of both renal arteries. The patient had been doing well until March 28 when she had an episode of dizziness. This was significant and ultimately she had to call 911. When a arrived and took her blood pressure was 220/180 according to the patient.   The patient was evaluated by her primary care physician Dr. Kriste Basque and he appreciated an abdominal bruit. This prompted an extensive workup and a CT angiogram was obtained which suggested bilateral renal artery fibromuscular dysplasia. 2 additional blood pressure medications were added and her blood pressure now is under better control. She's had no further problems with dizziness. There was also some concern about a sub-centimeter meningioma along the floor of the right middle cranial fossa that might potentially explain her dizziness and therefore she is also being followed by neurosurgery and neurology. However certainly the dizziness could be related to her very elevated blood pressure.  The patient does not remember having significant problems with blood pressure control prior to this episode.  Past Medical History  Diagnosis Date  . Hypertension   . Varicose veins of lower extremities   . Hypercholesterolemia   . GERD (gastroesophageal reflux disease)   . Colonic polyp   . Headache(784.0)   . Anxiety   . Idiopathic urticaria     Family History  Problem Relation Age of Onset  . Hypertension Mother   . Hyperlipidemia Mother   . Diabetes Mother   . Breast cancer Mother   . Pulmonary embolism Mother   . Deep vein thrombosis Mother   . Colon cancer Neg Hx   . Esophageal cancer Neg Hx   . Stomach cancer Neg Hx   . Dementia Father   .  Sickle cell anemia Cousin     SOCIAL HISTORY: Social History   Social History  . Marital Status: Single    Spouse Name: N/A  . Number of Children: 0  . Years of Education: 12   Occupational History  .      [employer]   Social History Main Topics  . Smoking status: Never Smoker   . Smokeless tobacco: Not on file  . Alcohol Use: No  . Drug Use: No  . Sexual Activity: Not on file   Other Topics Concern  . Not on file   Social History Narrative   Lives alone   No caffeine use    No Known Allergies  Current Outpatient Prescriptions  Medication Sig Dispense Refill  . amLODipine (NORVASC) 5 MG tablet Take 1 tablet (5 mg total) by mouth daily. 30 tablet 11  . aspirin 81 MG tablet Take 81 mg by mouth daily.      . Cholecalciferol (VITAMIN D-3) 1000 UNITS CAPS Take 1 capsule by mouth daily. Take 5000 units daily    . estrogens, conjugated, (PREMARIN) 0.3 MG tablet Take 0.3 mg by mouth daily. Take daily for 21 days then do not take for 7 days.     Marland Kitchen levETIRAcetam (KEPPRA) 500 MG tablet Take 1 tablet (500 mg total) by mouth 2 (two) times daily. 60 tablet 6  . losartan (COZAAR) 100 MG tablet TAKE 1 TABLET (100 MG TOTAL) BY MOUTH DAILY. 90 tablet 2  . nebivolol (BYSTOLIC) 10 MG tablet Take  1 tablet (10 mg total) by mouth daily. 30 tablet 12   No current facility-administered medications for this visit.    REVIEW OF SYSTEMS:  [X]  denotes positive finding, [ ]  denotes negative finding Cardiac  Comments:  Chest pain or chest pressure:    Shortness of breath upon exertion:    Short of breath when lying flat:    Irregular heart rhythm:        Vascular    Pain in calf, thigh, or hip brought on by ambulation:    Pain in feet at night that wakes you up from your sleep:     Blood clot in your veins:    Leg swelling:         Pulmonary    Oxygen at home:    Productive cough:     Wheezing:         Neurologic    Sudden weakness in arms or legs:     Sudden numbness in arms or legs:      Sudden onset of difficulty speaking or slurred speech:    Temporary loss of vision in one eye:     Problems with dizziness:  X       Gastrointestinal    Blood in stool:     Vomited blood:         Genitourinary    Burning when urinating:     Blood in urine:        Psychiatric    Major depression:         Hematologic    Bleeding problems:    Problems with blood clotting too easily:        Skin    Rashes or ulcers:        Constitutional    Fever or chills:      PHYSICAL EXAM: There were no vitals filed for this visit.  GENERAL: The patient is a well-nourished female, in no acute distress. The vital signs are documented above. CARDIAC: There is a regular rate and rhythm.  VASCULAR: I do not detect carotid bruits. She has palpable femoral pulses and palpable dorsalis pedis pulses bilaterally. As no significant lower extremity swelling. PULMONARY: There is good air exchange bilaterally without wheezing or rales. ABDOMEN: Soft and non-tender with normal pitched bowel sounds. I am unable to appreciate an abdominal bruit as she has very hyperactive bowel sounds. MUSCULOSKELETAL: There are no major deformities or cyanosis. NEUROLOGIC: No focal weakness or paresthesias are detected. SKIN: There are no ulcers or rashes noted. PSYCHIATRIC: The patient has a normal affect.  DATA:   CT ANGIOGRAM ABDOMEN PELVIS: I have reviewed the CT angiogram of the abdomen and pelvis that was performed on 01/22/2016. I do not appreciate any significant renal artery stenosis although there is a string of beads appearance consistent with fibromuscular dysplasia of both renal arteries.  Creatinine on 12/15/2015 was 0.71 GFR greater than 60  MEDICAL ISSUES:   FIBROMUSCULAR DYSPLASIA BILATERAL RENAL ARTERIES:  Given her problems with blood pressure control recently (now on 3 medications including Norvasc, Cozaar, and Bysystolic), I would recommend that we proceed with renal arteriography to further  evaluate her fibromuscular dysplasia. If this appears to be significant this could potentially be addressed with balloon angioplasty at the same time as the procedure. I have tentatively scheduled this for Dr. Leonides Sake on 02/11/2016. Have discussed the indications for the procedure. In addition we have discussed the potential complications of the procedure including, but not limited to, bleeding, arterial  injury, or renal artery injury. She is agreeable to proceed and this is scheduled for 02/11/2016.   Waverly Ferrari [facility] [place] Beeper: [phone]

## 2016-01-29 NOTE — Progress Notes (Signed)
Filed Vitals:   01/29/16 1418 01/29/16 1421 01/29/16 1422  BP: 161/76 176/81 172/78  Pulse: 60 60 69  Temp: 97.7 F (36.5 C)    Resp: 16    Height: 5\' 2"  (1.575 m)    Weight: 132 lb (59.875 kg)    SpO2: 100%

## 2016-02-01 ENCOUNTER — Other Ambulatory Visit: Payer: Self-pay

## 2016-02-04 ENCOUNTER — Encounter: Payer: Self-pay | Admitting: *Deleted

## 2016-02-04 ENCOUNTER — Ambulatory Visit (INDEPENDENT_AMBULATORY_CARE_PROVIDER_SITE_OTHER): Payer: Federal, State, Local not specified - PPO | Admitting: Diagnostic Neuroimaging

## 2016-02-04 ENCOUNTER — Encounter: Payer: Self-pay | Admitting: Diagnostic Neuroimaging

## 2016-02-04 VITALS — BP 119/69 | HR 54 | Ht 62.0 in | Wt 132.2 lb

## 2016-02-04 DIAGNOSIS — D329 Benign neoplasm of meninges, unspecified: Secondary | ICD-10-CM

## 2016-02-04 DIAGNOSIS — I10 Essential (primary) hypertension: Secondary | ICD-10-CM

## 2016-02-04 DIAGNOSIS — I773 Arterial fibromuscular dysplasia: Secondary | ICD-10-CM

## 2016-02-04 DIAGNOSIS — R42 Dizziness and giddiness: Secondary | ICD-10-CM

## 2016-02-04 NOTE — Patient Instructions (Signed)
-   taper off levetiracetam --> 1 tab daily x 2 weeks, then half tab daily x 2 weeks, then stop

## 2016-02-04 NOTE — Progress Notes (Signed)
[facility]  PATIENT: [name] [name] DOB: [birth year]  REFERRING CLINICIAN: Lynden Oxford HISTORY FROM: patient  REASON FOR VISIT: new consult    HISTORICAL  CHIEF COMPLAINT:  Chief Complaint  Patient presents with  . Dizziness and giddiness    rm 7, "not dizzy but tired and sleepy; has ct angiogram of abd. I have fibromuscular dysplasia of renal artery, upcoming surgery"  . Follow-up    6 week    HISTORY OF PRESENT ILLNESS:   UPDATE   65 year old right-handed female here for evaluation of dizziness, elevated blood pressure, meningioma, possible seizure. Patient has history of hypertension and is on medication. Over the past several months patient has been having intermittent waves of dizzy spells, while sitting, standing or exerting herself. She has never had loss of consciousness or spinning sensation. She describes a lightheaded sensation and the wave of funny feeling that moves from the left to the right side of her head. On 12/15/15 patient had a severe attack with nausea and vomiting. She felt like she was dying and was able to call 911 and unlock the door. Blood pressure was noted to be 220/180 patient was taken to the hospital for evaluation. 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 Neurology consult was obtained and patient was recommended to start levetiracetam for seizure prevention. Patient also has follow-up with neurosurgery for follow-up of this possible meningioma. Since discharge patient is feeling better. She has not returned to work. She is not driving. She does not have a blood pressure machine at home.   REVIEW OF SYSTEMS: Full 14 system review of systems performed and negative with exception of: Diarrhea runny nose dizziness.  ALLERGIES: No Known Allergies  HOME MEDICATIONS: Outpatient Prescriptions Prior to Visit  Medication Sig Dispense Refill  . amLODipine (NORVASC) 5 MG tablet Take 1 tablet (5 mg total) by mouth  daily. 30 tablet 11  . aspirin 81 MG tablet Take 81 mg by mouth daily.      . Cholecalciferol (VITAMIN D-3) 1000 UNITS CAPS Take 1 capsule by mouth daily. Take 5000 units daily    . estrogens, conjugated, (PREMARIN) 0.3 MG tablet Take 0.3 mg by mouth daily. Take daily for 21 days then do not take for 7 days.     Marland Kitchen levETIRAcetam (KEPPRA) 500 MG tablet Take 1 tablet (500 mg total) by mouth 2 (two) times daily. 60 tablet 6  . losartan (COZAAR) 100 MG tablet TAKE 1 TABLET (100 MG TOTAL) BY MOUTH DAILY. 90 tablet 2  . nebivolol (BYSTOLIC) 10 MG tablet Take 1 tablet (10 mg total) by mouth daily. 30 tablet 12   No facility-administered medications prior to visit.    PAST MEDICAL HISTORY: Past Medical History  Diagnosis Date  . Hypertension   . Varicose veins of lower extremities   . Hypercholesterolemia   . GERD (gastroesophageal reflux disease)   . Colonic polyp   . Headache(784.0)   . Anxiety   . Idiopathic urticaria   . FMD (facioscapulohumeral muscular dystrophy) (HCC)     PAST SURGICAL HISTORY: Past Surgical History  Procedure Laterality Date  . Vaginal hysterectomy    . Bilateral breast implants    . Benign breast biopsy    . Varicose vein    . Cataract right eye w/ lens implant    . Excision of large anal polyp    . Colonoscopy    . Polypectomy      FAMILY HISTORY: Family History  Problem Relation  Age of Onset  . Hypertension Mother   . Hyperlipidemia Mother   . Diabetes Mother   . Breast cancer Mother   . Pulmonary embolism Mother   . Deep vein thrombosis Mother   . Colon cancer Neg Hx   . Esophageal cancer Neg Hx   . Stomach cancer Neg Hx   . Dementia Father   . Sickle cell anemia Cousin     SOCIAL HISTORY:  Social History   Social History  . Marital Status: Single    Spouse Name: N/A  . Number of Children: 0  . Years of Education: 12   Occupational History  .      [employer]   Social History Main Topics  . Smoking status: Never Smoker   . Smokeless  tobacco: Not on file  . Alcohol Use: No  . Drug Use: No  . Sexual Activity: Not on file   Other Topics Concern  . Not on file   Social History Narrative   Lives alone   No caffeine use     PHYSICAL EXAM  GENERAL EXAM/CONSTITUTIONAL: Vitals:  Filed Vitals:   02/04/16 1523  BP: 119/69  Pulse: 54  Height: 5\' 2"  (1.575 m)  Weight: 132 lb 3.2 oz (59.966 kg)   Body mass index is 24.17 kg/(m^2). No exam data present  Patient is in no distress; well developed, nourished and groomed; neck is supple  CARDIOVASCULAR:  Examination of carotid arteries is normal; no carotid bruits  Regular rate and rhythm, no murmurs  Examination of peripheral vascular system by observation and palpation is normal  EYES:  Ophthalmoscopic exam of optic discs and posterior segments is normal; no papilledema or hemorrhages  MUSCULOSKELETAL:  Gait, strength, tone, movements noted in Neurologic exam below  NEUROLOGIC: MENTAL STATUS:  No flowsheet data found.  awake, alert, oriented to person, place and time  recent and remote memory intact  normal attention and concentration  language fluent, comprehension intact, naming intact,   fund of knowledge appropriate  CRANIAL NERVE:   2nd - no papilledema on fundoscopic exam  2nd, 3rd, 4th, 6th - pupils equal and reactive to light, visual fields full to confrontation, extraocular muscles intact, no nystagmus  5th - facial sensation symmetric  7th - facial strength symmetric  8th - hearing intact  9th - palate elevates symmetrically, uvula midline  11th - shoulder shrug symmetric  12th - tongue protrusion midline  MOTOR:   normal bulk and tone, full strength in the BUE, BLE  SENSORY:   normal and symmetric to light touch, temperature, vibration  COORDINATION:   finger-nose-finger, fine finger movements normal  REFLEXES:   deep tendon reflexes present and symmetric  GAIT/STATION:   narrow based gait; romberg is  negative    DIAGNOSTIC DATA (LABS, IMAGING, TESTING) - I reviewed patient records, labs, notes, testing and imaging myself where available.  Lab Results  Component Value Date   WBC 5.5 12/15/2015   HGB 12.9 12/15/2015   HCT 38.5 12/15/2015   MCV 86.5 12/15/2015   PLT 266 12/15/2015      Component Value Date/Time   NA 138 12/15/2015 1020   K 3.4* 12/15/2015 1020   CL 102 12/15/2015 1020   CO2 26 12/15/2015 1020   GLUCOSE 119* 12/15/2015 1020   BUN 11 12/15/2015 1020   CREATININE 0.71 12/15/2015 1020   CALCIUM 9.0 12/15/2015 1020   PROT 7.7 12/15/2015 1020   ALBUMIN 3.9 12/15/2015 1020   AST 42* 12/15/2015 1020  ALT 20 12/15/2015 1020   ALKPHOS 78 12/15/2015 1020   BILITOT 0.7 12/15/2015 1020   GFRNONAA >60 12/15/2015 1020   GFRAA >60 12/15/2015 1020   Lab Results  Component Value Date   CHOL 237* 09/29/2014   HDL 77.70 09/29/2014   LDLCALC 145* 09/29/2014   LDLDIRECT 97.9 10/21/2011   TRIG 71.0 09/29/2014   CHOLHDL 3 09/29/2014   No results found for: HGBA1C No results found for: VITAMINB12 Lab Results  Component Value Date   TSH 1.78 09/29/2014    12/15/15 MRI brain (without) / MRA head (without) [I reviewed images myself and agree with interpretation. -VRP]  1. No acute infarct. 2. Tiny chronic left cerebellar infarct. 3. New 7 mm T2 hyperintense lesion involving anterior right temporal lobe cortex, indeterminate. Postcontrast imaging is recommended to assess for small neoplasm.  4. No major intracranial arterial occlusion or significant stenosis.  12/15/15 MRI brain (with) - Subcentimeter meningioma is demonstrated along the floor of the RIGHT middle cranial fossa.  12/15/15 CT c/a/p 1. Nonspecific small sclerotic foci in the sternum and T5 and L4 vertebral bodies. Consider correlation with a bone scintigraphy study on a short term outpatient basis. 2. Otherwise no potential sites of metastatic disease in the chest, abdomen or pelvis. 3. Minimal  tree-in-bud opacity in the right middle lobe without bronchiectasis, favoring a minimal infectious or inflammatory bronchiolitis of doubtful clinical significance. 4. Mild sigmoid diverticulosis.  12/16/15 EEG  - This is a normal EEG for the patients stated age. There were no focal, hemispheric or lateralizing features. No epileptiform activity was recorded. A normal EEG does not exclude the diagnosis of a seizure disorder and if seizure remains high on the list of differential diagnosis, an ambulatory EEG may be of value. Clinical correlation is required.  12/25/15 EKG [I reviewed images myself and agree with interpretation. -VRP]  - normal sinus rhythm  01/01/16 EEG  - Normal EEG in the awake and asleep states.  01/22/16 CT angiogram abd/pelvis - There are characteristic findings associated with fibromuscular dysplasia of the renal arteries. Endovascular therapy may provide relief from hypertension. Endovascular specialist consultation is recommended.      ASSESSMENT AND PLAN  65 y.o. year old female here with history of hypertension, with new onset episode on 12/15/15 of severe dizziness, lightheadedness, nausea, vomiting associated with elevated blood pressure 220/180. Most likely represents hypertensive emergency. Patient also found to have likely incidental right temporal meningioma. Patient was started empirically on antiseizure medication in the hospital.   Dx: hypertensive emergency + meningioma (incidental)  1. Dizziness and giddiness   2. Dizziness   3. Meningioma (HCC)   4. Accelerated hypertension   5. Fibromuscular dysplasia (HCC)      PLAN: - taper off levetiracetam 500mg  BID  - follow up with neurosurgery and MRI brain for meningioma follow up (I think it is likely incidental) - follow up BP readings with PCP and also renal angiogram - no work / driving restrictions from seizure or neurology standpoint; however now having more fatigue issues --> recommend to follow  up with PCP  Return in about 3 months (around 05/06/2016).    Suanne Marker, MD 02/04/2016, 3:48 PM Certified in Neurology, Neurophysiology and Neuroimaging  [facility] [address], [address] [place], [place] [address] [phone]

## 2016-02-11 ENCOUNTER — Ambulatory Visit (HOSPITAL_COMMUNITY)
Admission: RE | Admit: 2016-02-11 | Discharge: 2016-02-11 | Disposition: A | Discharge status: Home / Self Care | Payer: Federal, State, Local not specified - PPO | Source: Ambulatory Visit | Attending: Vascular Surgery | Admitting: Vascular Surgery

## 2016-02-11 ENCOUNTER — Encounter (HOSPITAL_COMMUNITY)
Admission: RE | Disposition: A | Discharge status: Home / Self Care | Payer: Self-pay | Source: Ambulatory Visit | Attending: Vascular Surgery

## 2016-02-11 DIAGNOSIS — I773 Arterial fibromuscular dysplasia: Secondary | ICD-10-CM | POA: Insufficient documentation

## 2016-02-11 DIAGNOSIS — Q614 Renal dysplasia: Secondary | ICD-10-CM | POA: Diagnosis not present

## 2016-02-11 DIAGNOSIS — Z79899 Other long term (current) drug therapy: Secondary | ICD-10-CM | POA: Diagnosis not present

## 2016-02-11 DIAGNOSIS — Z7982 Long term (current) use of aspirin: Secondary | ICD-10-CM | POA: Diagnosis not present

## 2016-02-11 DIAGNOSIS — I10 Essential (primary) hypertension: Secondary | ICD-10-CM | POA: Insufficient documentation

## 2016-02-11 DIAGNOSIS — I701 Atherosclerosis of renal artery: Secondary | ICD-10-CM | POA: Diagnosis present

## 2016-02-11 HISTORY — PX: PERIPHERAL VASCULAR CATHETERIZATION: SHX172C

## 2016-02-11 LAB — POCT I-STAT, CHEM 8
BUN: 13 mg/dL (ref 6–20)
CHLORIDE: 101 mmol/L (ref 101–111)
CREATININE: 0.9 mg/dL (ref 0.44–1.00)
Calcium, Ion: 1.15 mmol/L (ref 1.13–1.30)
GLUCOSE: 64 mg/dL — AB (ref 65–99)
HCT: 40 % (ref 36.0–46.0)
HEMOGLOBIN: 13.6 g/dL (ref 12.0–15.0)
POTASSIUM: 2.8 mmol/L — AB (ref 3.5–5.1)
Sodium: 144 mmol/L (ref 135–145)
TCO2: 31 mmol/L (ref 0–100)

## 2016-02-11 SURGERY — RENAL ANGIOGRAPHY

## 2016-02-11 MED ORDER — POTASSIUM CHLORIDE CRYS ER 20 MEQ PO TBCR
EXTENDED_RELEASE_TABLET | ORAL | Status: AC
  Administered 2016-02-11: 40 meq via ORAL
  Filled 2016-02-11: qty 2

## 2016-02-11 MED ORDER — HEPARIN (PORCINE) IN NACL 2-0.9 UNIT/ML-% IJ SOLN
INTRAMUSCULAR | Status: AC
  Filled 2016-02-11: qty 1000

## 2016-02-11 MED ORDER — HEPARIN SODIUM (PORCINE) 1000 UNIT/ML IJ SOLN
INTRAMUSCULAR | Status: DC | PRN
  Administered 2016-02-11: 1000 [IU] via INTRAVENOUS

## 2016-02-11 MED ORDER — LIDOCAINE HCL (PF) 1 % IJ SOLN
INTRAMUSCULAR | Status: AC
  Filled 2016-02-11: qty 30

## 2016-02-11 MED ORDER — ACETAMINOPHEN 325 MG PO TABS
650.0000 mg | ORAL_TABLET | ORAL | Status: DC | PRN
Start: 2016-02-11 — End: 2016-02-11

## 2016-02-11 MED ORDER — SODIUM CHLORIDE 0.9 % IV SOLN: 1.0000 mL/kg/h | INTRAVENOUS | Status: DC

## 2016-02-11 MED ORDER — IODIXANOL 320 MG/ML IV SOLN
INTRAVENOUS | Status: DC | PRN
  Administered 2016-02-11: 40 mL via INTRAVENOUS

## 2016-02-11 MED ORDER — LIDOCAINE HCL (PF) 1 % IJ SOLN
INTRAMUSCULAR | Status: DC | PRN
  Administered 2016-02-11 (×2): 10 mL via SUBCUTANEOUS

## 2016-02-11 MED ORDER — POTASSIUM CHLORIDE CRYS ER 20 MEQ PO TBCR
40.0000 meq | EXTENDED_RELEASE_TABLET | Freq: Once | ORAL | Status: AC
  Administered 2016-02-11: 40 meq via ORAL
  Filled 2016-02-11: qty 2

## 2016-02-11 MED ORDER — SODIUM CHLORIDE 0.9 % IV SOLN
INTRAVENOUS | Status: DC
  Administered 2016-02-11: 11:00:00 via INTRAVENOUS

## 2016-02-11 MED ORDER — HEPARIN (PORCINE) IN NACL 2-0.9 UNIT/ML-% IJ SOLN
INTRAMUSCULAR | Status: DC | PRN
  Administered 2016-02-11: 1000 mL

## 2016-02-11 SURGICAL SUPPLY — 13 items
CATH CROSS OVER TEMPO 5F (CATHETERS) ×3 IMPLANT
CATH OMNI FLUSH 5F 65CM (CATHETERS) ×3 IMPLANT
CATH SOFT-VU 4F 65 STRAIGHT (CATHETERS) ×1 IMPLANT
CATH SOFT-VU STRAIGHT 4F 65CM (CATHETERS) ×2
COVER PRB 48X5XTLSCP FOLD TPE (BAG) ×1 IMPLANT
COVER PROBE 5X48 (BAG) ×2
GUIDEWIRE ANGLED .035X150CM (WIRE) ×3 IMPLANT
KIT PV (KITS) ×3 IMPLANT
SHEATH PINNACLE 6F 10CM (SHEATH) ×3 IMPLANT
SYR MEDRAD MARK V 150ML (SYRINGE) ×3 IMPLANT
TRANSDUCER W/STOPCOCK (MISCELLANEOUS) ×3 IMPLANT
TRAY PV CATH (CUSTOM PROCEDURE TRAY) ×3 IMPLANT
WIRE BENTSON .035X145CM (WIRE) ×3 IMPLANT

## 2016-02-11 NOTE — H&P (View-Only) (Signed)
[facility] [place]  Patient name: [name] [name] MRN: [id number] DOB: [birth year] Sex: female  REASON FOR CONSULT: Renal artery fibromuscular dysplasia. Referred by [facility]  HPI: [name] [name] is a 65 y.o. female, who is referred for evaluation of fibromuscular dysplasia of both renal arteries. The patient had been doing well until March 28 when she had an episode of dizziness. This was significant and ultimately she had to call 911. When a arrived and took her blood pressure was 220/180 according to the patient.   The patient was evaluated by her primary care physician Dr. Kriste Basque and he appreciated an abdominal bruit. This prompted an extensive workup and a CT angiogram was obtained which suggested bilateral renal artery fibromuscular dysplasia. 2 additional blood pressure medications were added and her blood pressure now is under better control. She's had no further problems with dizziness. There was also some concern about a sub-centimeter meningioma along the floor of the right middle cranial fossa that might potentially explain her dizziness and therefore she is also being followed by neurosurgery and neurology. However certainly the dizziness could be related to her very elevated blood pressure.  The patient does not remember having significant problems with blood pressure control prior to this episode.  Past Medical History  Diagnosis Date  . Hypertension   . Varicose veins of lower extremities   . Hypercholesterolemia   . GERD (gastroesophageal reflux disease)   . Colonic polyp   . Headache(784.0)   . Anxiety   . Idiopathic urticaria     Family History  Problem Relation Age of Onset  . Hypertension Mother   . Hyperlipidemia Mother   . Diabetes Mother   . Breast cancer Mother   . Pulmonary embolism Mother   . Deep vein thrombosis Mother   . Colon cancer Neg Hx   . Esophageal cancer Neg Hx   . Stomach cancer Neg Hx   . Dementia Father   .  Sickle cell anemia Cousin     SOCIAL HISTORY: Social History   Social History  . Marital Status: Single    Spouse Name: N/A  . Number of Children: 0  . Years of Education: 12   Occupational History  .      [employer]   Social History Main Topics  . Smoking status: Never Smoker   . Smokeless tobacco: Not on file  . Alcohol Use: No  . Drug Use: No  . Sexual Activity: Not on file   Other Topics Concern  . Not on file   Social History Narrative   Lives alone   No caffeine use    No Known Allergies  Current Outpatient Prescriptions  Medication Sig Dispense Refill  . amLODipine (NORVASC) 5 MG tablet Take 1 tablet (5 mg total) by mouth daily. 30 tablet 11  . aspirin 81 MG tablet Take 81 mg by mouth daily.      . Cholecalciferol (VITAMIN D-3) 1000 UNITS CAPS Take 1 capsule by mouth daily. Take 5000 units daily    . estrogens, conjugated, (PREMARIN) 0.3 MG tablet Take 0.3 mg by mouth daily. Take daily for 21 days then do not take for 7 days.     Marland Kitchen levETIRAcetam (KEPPRA) 500 MG tablet Take 1 tablet (500 mg total) by mouth 2 (two) times daily. 60 tablet 6  . losartan (COZAAR) 100 MG tablet TAKE 1 TABLET (100 MG TOTAL) BY MOUTH DAILY. 90 tablet 2  . nebivolol (BYSTOLIC) 10 MG tablet Take  1 tablet (10 mg total) by mouth daily. 30 tablet 12   No current facility-administered medications for this visit.    REVIEW OF SYSTEMS:  [X]  denotes positive finding, [ ]  denotes negative finding Cardiac  Comments:  Chest pain or chest pressure:    Shortness of breath upon exertion:    Short of breath when lying flat:    Irregular heart rhythm:        Vascular    Pain in calf, thigh, or hip brought on by ambulation:    Pain in feet at night that wakes you up from your sleep:     Blood clot in your veins:    Leg swelling:         Pulmonary    Oxygen at home:    Productive cough:     Wheezing:         Neurologic    Sudden weakness in arms or legs:     Sudden numbness in arms or legs:      Sudden onset of difficulty speaking or slurred speech:    Temporary loss of vision in one eye:     Problems with dizziness:  X       Gastrointestinal    Blood in stool:     Vomited blood:         Genitourinary    Burning when urinating:     Blood in urine:        Psychiatric    Major depression:         Hematologic    Bleeding problems:    Problems with blood clotting too easily:        Skin    Rashes or ulcers:        Constitutional    Fever or chills:      PHYSICAL EXAM: There were no vitals filed for this visit.  GENERAL: The patient is a well-nourished female, in no acute distress. The vital signs are documented above. CARDIAC: There is a regular rate and rhythm.  VASCULAR: I do not detect carotid bruits. She has palpable femoral pulses and palpable dorsalis pedis pulses bilaterally. As no significant lower extremity swelling. PULMONARY: There is good air exchange bilaterally without wheezing or rales. ABDOMEN: Soft and non-tender with normal pitched bowel sounds. I am unable to appreciate an abdominal bruit as she has very hyperactive bowel sounds. MUSCULOSKELETAL: There are no major deformities or cyanosis. NEUROLOGIC: No focal weakness or paresthesias are detected. SKIN: There are no ulcers or rashes noted. PSYCHIATRIC: The patient has a normal affect.  DATA:   CT ANGIOGRAM ABDOMEN PELVIS: I have reviewed the CT angiogram of the abdomen and pelvis that was performed on 01/22/2016. I do not appreciate any significant renal artery stenosis although there is a string of beads appearance consistent with fibromuscular dysplasia of both renal arteries.  Creatinine on 12/15/2015 was 0.71 GFR greater than 60  MEDICAL ISSUES:   FIBROMUSCULAR DYSPLASIA BILATERAL RENAL ARTERIES:  Given her problems with blood pressure control recently (now on 3 medications including Norvasc, Cozaar, and Bysystolic), I would recommend that we proceed with renal arteriography to further  evaluate her fibromuscular dysplasia. If this appears to be significant this could potentially be addressed with balloon angioplasty at the same time as the procedure. I have tentatively scheduled this for Dr. Leonides Sake on 02/11/2016. Have discussed the indications for the procedure. In addition we have discussed the potential complications of the procedure including, but not limited to, bleeding, arterial  injury, or renal artery injury. She is agreeable to proceed and this is scheduled for 02/11/2016.   Waverly Ferrari [facility] [place] Beeper: [phone]

## 2016-02-11 NOTE — Progress Notes (Addendum)
Site area: RFA   Site Prior to Removal:  Level 0 Pressure Applied For:25 mins Manual:   yes Patient Status During Pull:  stable Post Pull Site:  Level 0 Post Pull Instructions Given:  yes Post Pull Pulses Present: palpable Dressing Applied:  Yes tegaderm Bedrest begins @ 12:45 until 16:45 Comments:

## 2016-02-11 NOTE — Discharge Instructions (Signed)
Angiogram, Care After °Refer to this sheet in the next few weeks. These instructions provide you with information about caring for yourself after your procedure. Your health care provider may also give you more specific instructions. Your treatment has been planned according to current medical practices, but problems sometimes occur. Call your health care provider if you have any problems or questions after your procedure. °WHAT TO EXPECT AFTER THE PROCEDURE °After your procedure, it is typical to have the following: °· Bruising at the catheter insertion site that usually fades within 1-2 weeks. °· Blood collecting in the tissue (hematoma) that may be painful to the touch. It should usually decrease in size and tenderness within 1-2 weeks. °HOME CARE INSTRUCTIONS °· Take medicines only as directed by your health care provider. °· You may shower 24-48 hours after the procedure or as directed by your health care provider. Remove the bandage (dressing) and gently wash the site with plain soap and water. Pat the area dry with a clean towel. Do not rub the site, because this may cause bleeding. °· Do not take baths, swim, or use a hot tub until your health care provider approves. °· Check your insertion site every day for redness, swelling, or drainage. °· Do not apply powder or lotion to the site. °· Do not lift over 10 lb (4.5 kg) for 5 days after your procedure or as directed by your health care provider. °· Ask your health care provider when it is okay to: °¨ Return to work or school. °¨ Resume usual physical activities or sports. °¨ Resume sexual activity. °· Do not drive home if you are discharged the same day as the procedure. Have someone else drive you. °· You may drive 24 hours after the procedure unless otherwise instructed by your health care provider. °· Do not operate machinery or power tools for 24 hours after the procedure or as directed by your health care provider. °· If your procedure was done as an  outpatient procedure, which means that you went home the same day as your procedure, a responsible adult should be with you for the first 24 hours after you arrive home. °· Keep all follow-up visits as directed by your health care provider. This is important. °SEEK MEDICAL CARE IF: °· You have a fever. °· You have chills. °· You have increased bleeding from the catheter insertion site. Hold pressure on the site.  CALL 911 °SEEK IMMEDIATE MEDICAL CARE IF: °· You have unusual pain at the catheter insertion site. °· You have redness, warmth, or swelling at the catheter insertion site. °· You have drainage (other than a small amount of blood on the dressing) from the catheter insertion site. °· The catheter insertion site is bleeding, and the bleeding does not stop after 30 minutes of holding steady pressure on the site. °· The area near or just beyond the catheter insertion site becomes pale, cool, tingly, or numb. °  °This information is not intended to replace advice given to you by your health care provider. Make sure you discuss any questions you have with your health care provider. °  °Document Released: 03/24/2005 Document Revised: 09/26/2014 Document Reviewed: 02/06/2013 °Elsevier Interactive Patient Education ©2016 [employer] ° °

## 2016-02-11 NOTE — Op Note (Signed)
OPERATIVE NOTE   PROCEDURE: 1.  Right common femoral artery cannulation under ultrasound guidance 2.  Placement of catheter in aorta 3.  Aortogram 4.  Selective cannulation of right renal artery 5.  Right renal angiogram 6.  Selective cannulation of left renal artery 7.  Leftrenal angiogram   PRE-OPERATIVE DIAGNOSIS: bilateral renal fibromuscular dysplasia  POST-OPERATIVE DIAGNOSIS: same as above   SURGEON: Adele Barthel, MD  ANESTHESIA: conscious sedation  ESTIMATED BLOOD LOSS: 50 cc  CONTRAST: 40 cc  FINDING(S):  Aorta: patent  Superior mesenteric artery: patent Celiac artery: not well visualized   Right Left  RA Patent, mid-segment FMD without hemodynamic stenosis Patent, mid-distal segment FMD without hemodynamic stenosis, small ectasia  CIA Patent Patent  EIA Patent Patent  IIA Patent Patent   SPECIMEN(S):  none  INDICATIONS:   [name] [name] is a 65 y.o. female who presents with bilateral renal fibromuscular dysplasia and sudden worsening of hypertension.  Dr. Scot Dock recommended: aortogram with bilateral renal angiography to determine if she had significant renal artery stenosis that might be causing her hypertension.  I discussed with the patient the nature of angiographic procedures, especially the limited patencies of any endovascular intervention.  The patient is aware of that the risks of an angiographic procedure include but are not limited to: bleeding, infection, access site complications, renal failure, embolization, rupture of vessel, dissection, possible need for emergent surgical intervention, possible need for surgical procedures to treat the patient's pathology, and stroke and death.  The patient is aware of the risks and agrees to proceed.  DESCRIPTION: After full informed consent was obtained from the patient, the patient was brought back to the angiography suite.  The patient was placed supine upon the angiography table and connected to  cardiopulmonary monitoring equipment.  The patient was then given conscious sedation, the amounts of which are documented in the patient's chart.  A circulating radiologic technician maintained continuous monitoring of the patient's cardiopulmonary status.  Additionally, the control room radiologic technician provided backup monitoring throughout the procedure.  The patient was prepped and drape in the standard fashion for an angiographic procedure.  At this point, attention was turned to the right groin. Under ultrasound guidance,  The subcutaneous tissue surrounding the right common femoral artery was anesthesized with 1% lidocaine with epinephrine.  The artery was then cannulated with a 18 gauge needle.  The [facility] wire was passed up into the aorta.  The needle was exchanged for a 6-Fr sheath, which was advanced over the wire into the common femoral artery.  The dilator was then removed.  The Omniflush catheter was then loaded over the wire up to the level of L1.  The catheter was connected to the power injector circuit.  After de-airring and de-clotting the circuit, a power injector aortogram was completed.  This aortogram demonstrate bilateral renal artery fibromuscular dysplasia.  I felt more detailed evaluation was necessary.  I exchanged the catheter for a Crossover catheter.  I selected the left renal artery and advanced the wire into the renal artery.  I engaged the catheter into the orifice and removed the wire.  Hand injection was completed to evaluate the left renal artery.  There was some concern for hemodynamically significant stenosis in the distal renal artery.  I tried to advance the Bentson wire into the distal renal artery but it would not track.  I exchanged it for a Glidewire, which I used to advance into the distal renal artery.  I exchanged the catheter for  a straight catheter.  Using this catheter connected to the pressure circuit, I completed a pull back gradient in this renal artery.   There was a minimal gradient <5 mm Hg, so no intervention was needed.    At this point, I exchanged the catheter for a Crossover catheter and selected the right renal artery.  I advanced the wire into the renal artery and exchanged the orifice with the catheter.  I did a hand injection to completed the right renal angiogram.  This demonstrated no evidence of hemodynamically significant stenoses.  I replaced the wire and removed the catheter and wire together.  The sheath was aspirated.  No clots were present and the sheath was reloaded with heparinized saline.     COMPLICATIONS: none  CONDITION: stable  Adele Barthel, MD [facility] Office: [phone] Pager: [phone]  02/11/2016, 11:56 AM

## 2016-02-11 NOTE — Interval H&P Note (Signed)
[facility]  History and Physical Update  The patient was interviewed and re-examined.  The patient's previous History and Physical has been reviewed and is unchanged from Dr. Scot Dock consult .  There is no change in the plan of care: Aortogram, bilateral renal artery angiography, possible intervention.   I discussed with the patient the nature of angiographic procedures, especially the limited patencies of any endovascular intervention.    The patient is aware of that the risks of an angiographic procedure include but are not limited to: bleeding, infection, access site complications, renal failure, embolization, rupture of vessel, dissection, arteriovenous fistula, possible need for emergent surgical intervention, possible need for surgical procedures to treat the patient's pathology, anaphylactic reaction to contrast, and stroke and death.    The patient is aware of the risks and agrees to proceed.  Adele Barthel, MD [facility] [place] Office: [phone] Pager: [phone]  02/11/2016, 10:58 AM

## 2016-02-12 ENCOUNTER — Encounter (HOSPITAL_COMMUNITY): Payer: Self-pay | Admitting: Vascular Surgery

## 2016-02-12 ENCOUNTER — Telehealth: Payer: Self-pay | Admitting: Vascular Surgery

## 2016-02-12 NOTE — Telephone Encounter (Signed)
-----  Message from Sharee Pimple, RN sent at 02/11/2016  2:17 PM EDT ----- Regarding: schedule   ----- Message -----    From: Fransisco Hertz, MD    Sent: 02/11/2016  12:06 PM      To: [address]  [name] [name] [id number] July 02, 1951  PROCEDURE: 1.  Right common femoral artery cannulation under ultrasound guidance 2.  Placement of catheter in aorta 3.  Aortogram 4.  Selective cannulation of right renal artery 5.  Right renal angiogram 6.  Selective cannulation of left renal artery 7.  Leftrenal angiogram  Follow-up: 4 weeks with Dr. Edilia Bo

## 2016-02-12 NOTE — Telephone Encounter (Signed)
Sched appt 6/28 at 11:30. Lm on hm# to inform pt.

## 2016-02-17 ENCOUNTER — Other Ambulatory Visit: Payer: Self-pay | Admitting: Pulmonary Disease

## 2016-02-22 ENCOUNTER — Ambulatory Visit (INDEPENDENT_AMBULATORY_CARE_PROVIDER_SITE_OTHER): Payer: Federal, State, Local not specified - PPO | Admitting: Adult Health

## 2016-02-22 ENCOUNTER — Encounter: Payer: Self-pay | Admitting: Adult Health

## 2016-02-22 ENCOUNTER — Other Ambulatory Visit (INDEPENDENT_AMBULATORY_CARE_PROVIDER_SITE_OTHER): Payer: Federal, State, Local not specified - PPO

## 2016-02-22 VITALS — BP 136/62 | HR 59 | Temp 97.8°F | Ht 62.0 in | Wt 132.0 lb

## 2016-02-22 DIAGNOSIS — R0989 Other specified symptoms and signs involving the circulatory and respiratory systems: Secondary | ICD-10-CM

## 2016-02-22 DIAGNOSIS — I10 Essential (primary) hypertension: Secondary | ICD-10-CM

## 2016-02-22 LAB — BASIC METABOLIC PANEL
BUN: 20 mg/dL (ref 6–23)
CHLORIDE: 102 meq/L (ref 96–112)
CO2: 32 meq/L (ref 19–32)
CREATININE: 0.96 mg/dL (ref 0.40–1.20)
Calcium: 9.8 mg/dL (ref 8.4–10.5)
GFR: 75.5 mL/min (ref >60.00)
Glucose, Bld: 85 mg/dL (ref 70–99)
POTASSIUM: 3.6 meq/L (ref 3.5–5.1)
Sodium: 141 mEq/L (ref 135–145)

## 2016-02-22 LAB — CBC WITH DIFFERENTIAL/PLATELET
BASOS ABS: 0 10*3/uL (ref 0.0–0.1)
Basophils Relative: 0.3 % (ref 0.0–3.0)
EOS ABS: 0.1 10*3/uL (ref 0.0–0.7)
EOS PCT: 1.8 % (ref 0.0–5.0)
HCT: 37.1 % (ref 36.0–46.0)
HEMOGLOBIN: 12.5 g/dL (ref 12.0–15.0)
Lymphocytes Relative: 20.2 % (ref 12.0–46.0)
Lymphs Abs: 1.1 10*3/uL (ref 0.7–4.0)
MCHC: 33.7 g/dL (ref 30.0–36.0)
MCV: 87.5 fl (ref 78.0–100.0)
MONO ABS: 0.6 10*3/uL (ref 0.1–1.0)
Monocytes Relative: 10.2 % (ref 3.0–12.0)
NEUTROS PCT: 67.5 % (ref 43.0–77.0)
Neutro Abs: 3.8 10*3/uL (ref 1.4–7.7)
Platelets: 319 10*3/uL (ref 150.0–400.0)
RBC: 4.25 Mil/uL (ref 3.87–5.11)
RDW: 13.1 % (ref 11.5–15.5)
WBC: 5.6 10*3/uL (ref 4.0–10.5)

## 2016-02-22 LAB — TSH: TSH: 3.1 u[IU]/mL (ref 0.35–4.50)

## 2016-02-22 NOTE — Progress Notes (Signed)
Subjective:    Patient ID: [name] [name], female    DOB: 05-12-1951, 66 y.o.   MRN: [id number]  HPI 65 year old female with hypertension  02/22/2016 Follow up : HTN /Renal Artery FMD  Patient returns for a one-month follow-up. Patient was recently found to have a abdominal bruit on exam. She had hypertensive emergency. Earlier this year requiring hospitalization with aggressive blood pressure management. CT angiogram suggested bilateral renal artery fibromuscular dysplasia. Patient was seen by vascular and vein specialist with Dr. Edilia Bo. She was set up for a renal angiography. This showed bilateral renal artery fibromuscular dysplasia without significant stenosis. She has follow-up with Dr. Edilia Bo later this month. She says that her dizziness has decreased in am, however, she continues to feel very weak with low energy. She was recently seen by neurology and is currently being tapered off of Keppra. She was placed on Keppra earlier this year after hypertensive emergency for preventive reasons.  She will be off keprra in 10 days.  She denies any severe headache, visual changes, chest pain, orthopnea, PND, leg swelling, syncope or edema     Past Medical History  Diagnosis Date  . Hypertension   . Varicose veins of lower extremities   . Hypercholesterolemia   . GERD (gastroesophageal reflux disease)   . Colonic polyp   . Headache(784.0)   . Anxiety   . Idiopathic urticaria   . FMD (facioscapulohumeral muscular dystrophy) (HCC)    Current Outpatient Prescriptions on File Prior to Visit  Medication Sig Dispense Refill  . amLODipine (NORVASC) 5 MG tablet Take 1 tablet (5 mg total) by mouth daily. 30 tablet 11  . aspirin EC 81 MG tablet Take 81 mg by mouth daily.    . Cholecalciferol (VITAMIN D3) 5000 units TABS Take 5,000 Units by mouth daily.    Marland Kitchen estrogens, conjugated, (PREMARIN) 0.3 MG tablet Take 0.3 mg by mouth See admin instructions. Take daily for 21 days then do not take for  7 days.    Marland Kitchen levETIRAcetam (KEPPRA) 500 MG tablet Take 500 mg by mouth daily. Take 1/2 tablet by mouth daily    . losartan (COZAAR) 100 MG tablet TAKE 1 TABLET (100 MG TOTAL) BY MOUTH DAILY. 90 tablet 2  . nebivolol (BYSTOLIC) 10 MG tablet Take 1 tablet (10 mg total) by mouth daily. 30 tablet 12   No current facility-administered medications on file prior to visit.      Review of Systems Constitutional:   No  weight loss, night sweats,  Fevers, chills, + fatigue, or  lassitude.  HEENT:   No headaches,  Difficulty swallowing,  Tooth/dental problems, or  Sore throat,                No sneezing, itching, ear ache, nasal congestion, post nasal drip,   CV:  No chest pain,  Orthopnea, PND, swelling in lower extremities, anasarca, dizziness, palpitations, syncope.   GI  No heartburn, indigestion, abdominal pain, nausea, vomiting, diarrhea, change in bowel habits, loss of appetite, bloody stools.   Resp: No shortness of breath with exertion or at rest.  No excess mucus, no productive cough,  No non-productive cough,  No coughing up of blood.  No change in color of mucus.  No wheezing.  No chest wall deformity  Skin: no rash or lesions.  GU: no dysuria, change in color of urine, no urgency or frequency.  No flank pain, no hematuria   MS:  No joint pain or swelling.  No decreased range  of motion.  No back pain.  Psych:  No change in mood or affect. No depression or anxiety.  No memory loss.         Objective:   Physical Exam Filed Vitals:   02/22/16 1047  BP: 136/62  Pulse: 59  Temp: 97.8 F (36.6 C)  TempSrc: Oral  Height: 5\' 2"  (1.575 m)  Weight: 132 lb (59.875 kg)  SpO2: 98%   GEN: A/Ox3; pleasant , NAD, well nourished   HEENT:  [place]/AT,  EACs-clear, TMs-wnl, NOSE-clear, THROAT-clear, no lesions, no postnasal drip or exudate noted.   NECK:  Supple w/ fair ROM; no JVD; normal carotid impulses w/o bruits; no thyromegaly or nodules palpated; no lymphadenopathy.  RESP  Clear   P & A; w/o, wheezes/ rales/ or rhonchi.no accessory muscle use, no dullness to percussion  CARD:  RRR, no m/r/g  , no peripheral edema, pulses intact, no cyanosis or clubbing. +soft abd bruit   GI:   Soft & nt; nml bowel sounds; no organomegaly or masses detected.  Musco: Warm bil, no deformities or joint swelling noted.   Neuro: alert, no focal deficits noted.    Skin: Warm, no lesions or rashes  Lorina Duffner NP-C  [facility]  02/22/2016        Assessment & Plan:

## 2016-02-22 NOTE — Assessment & Plan Note (Signed)
Renal FMD without sign stenosis  Cont follow up with Dr. Scot Dock , [facility].

## 2016-02-22 NOTE — Assessment & Plan Note (Signed)
Well controlled on her present regimen. Patient was with a hypertensive emergency. Early 2017 requiring aggressive blood pressure management. She has recently been  diagnosed with renal FMD without sign stenosis .  Cont on current regimen -will change dosing times to see if less side effects Check labs   Plan  Change Amlodipine and Bystolic to At bedtime  Dosing  Continue on Losartan daily in am .  Low salt diet.  Labs today  follow up Dr. Lenna Gilford  In 1 month and As needed

## 2016-02-22 NOTE — Progress Notes (Signed)
Quick Note:  LVM for pt to return call ______ 

## 2016-02-22 NOTE — Patient Instructions (Addendum)
Change Amlodipine and Bystolic to At bedtime  Dosing  Continue on Losartan daily in am .  Low salt diet.  Labs today  follow up Dr. Lenna Gilford  In 1 month and As needed

## 2016-02-23 ENCOUNTER — Telehealth: Payer: Self-pay | Admitting: Pulmonary Disease

## 2016-02-23 NOTE — Telephone Encounter (Signed)
Notes Recorded by Osa Craver, CMA on 02/22/2016 at 3:17 PM LVM for pt to return call. Notes Recorded by Melvenia Needles, NP on 02/22/2016 at 2:15 PM Labs are much better   Back to normal , nothing to explain weakness Cont w/ ov recs  Please contact office for sooner follow up if symptoms do not improve or worsen or seek emergency care  ----------------------------------------------------- Pt is aware of results. Nothing further was needed.

## 2016-03-09 ENCOUNTER — Encounter: Payer: Self-pay | Admitting: Vascular Surgery

## 2016-03-16 ENCOUNTER — Ambulatory Visit: Payer: Federal, State, Local not specified - PPO | Admitting: Vascular Surgery

## 2016-03-23 ENCOUNTER — Encounter: Payer: Self-pay | Admitting: Pulmonary Disease

## 2016-03-23 ENCOUNTER — Ambulatory Visit (INDEPENDENT_AMBULATORY_CARE_PROVIDER_SITE_OTHER): Payer: Federal, State, Local not specified - PPO | Admitting: Pulmonary Disease

## 2016-03-23 VITALS — BP 124/78 | HR 60 | Temp 98.0°F | Wt 131.8 lb

## 2016-03-23 DIAGNOSIS — I8393 Asymptomatic varicose veins of bilateral lower extremities: Secondary | ICD-10-CM

## 2016-03-23 DIAGNOSIS — D126 Benign neoplasm of colon, unspecified: Secondary | ICD-10-CM

## 2016-03-23 DIAGNOSIS — I10 Essential (primary) hypertension: Secondary | ICD-10-CM | POA: Diagnosis not present

## 2016-03-23 DIAGNOSIS — I773 Arterial fibromuscular dysplasia: Secondary | ICD-10-CM

## 2016-03-23 DIAGNOSIS — R42 Dizziness and giddiness: Secondary | ICD-10-CM

## 2016-03-23 DIAGNOSIS — D329 Benign neoplasm of meninges, unspecified: Secondary | ICD-10-CM

## 2016-03-23 DIAGNOSIS — E7800 Pure hypercholesterolemia, unspecified: Secondary | ICD-10-CM

## 2016-03-23 DIAGNOSIS — I7789 Other specified disorders of arteries and arterioles: Secondary | ICD-10-CM | POA: Diagnosis not present

## 2016-03-23 DIAGNOSIS — K219 Gastro-esophageal reflux disease without esophagitis: Secondary | ICD-10-CM

## 2016-03-23 DIAGNOSIS — F411 Generalized anxiety disorder: Secondary | ICD-10-CM

## 2016-03-23 NOTE — Patient Instructions (Signed)
Today we updated your med list in our EPIC system...    Continue your current medications the same...  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  Call for any questions...  Let's plan a follow up visit in 41mo, sooner if needed for problems.Marland KitchenMarland Kitchen

## 2016-03-23 NOTE — Progress Notes (Signed)
Subjective:     Patient ID: [name] [name], female   DOB: 1951/07/27, 65 y.o.   MRN: [id number]  HPI 65 y/o BF here for a follow up visit... she has mult med problems as noted....  ~  SEE PREV EPIC NOTES FOR OLDER DATA >>    LABS 4/14:  Chems- wnl w/ K=3.6;  CBC- wnl;  TSH=1.90;  VitD=32 & rec to take 1000u OTC supplement daily...  CXR 11/15 showed norm heart size, clear lungs, NAD.Marland KitchenMarland Kitchen  EKG 11/15 showed NSR, rate71, wnl, NAD...  ~  September 29, 2014:  40mo ROV & [name] [name] is here for a follow up on her BP- now back on meds= Cozaar100 + low salt diet etc;  She notes that she has not yet taken today's dose & BP here is 142/90; she has not been checking her BP at home; med tolerated well & she is feeling well w/o CP/ palpit/ SOB/ edema/ etc... She did her FASTING blood work today (see below)...    CHOL> on diet alone; FLP 1/16 showed TChol 237, TG 71, HDL 78, LDL 145; this is the worst she has had & rec to start Simva40- take 1/2 tab daily...    Vit D Defic> Labs 1/16 showed Vit D level = 17 & rec to start OTC VitD supplement ~5000u daily... We reviewed prob list, meds, xrays and labs> see below for updates >>   LABS 1/16:  FLP- not at goals on diet alone w/ LDL=145;  Chems- ok x K=3.3 & rec dietary supplement;  CBC- wnl;  TSH=1.78;  VitD=17...  ~  March 30, 2015:  89mo ROV & [name] [name] reports doing well, BP controlled on her regular Cozaar100 (tol well) w/ BP=134/70 today & she denies HA, visual sx, CP, palpit, SOB, edema, etc... She decided NOT to restart the Simva20 & wants to control her Chol w/ diet + exercise (we reviewed these today...  Finally she has continued the VitD supplement 5000u daily... EXAM shows Afeb, VSS, O2sat=99% on RA;  HEENT- neg, mallampati1;  Chest- clear w/o w/r/r;  Heart- RR w/o m/r/g;  Abd- neg- soft, nondender;  Ext- w/o c/c/e;  Neuro- intact... We reviewed prob list, meds, xrays and labs>  PLAN>>  [name] [name] is stable on her diet + Cozaar100, ASA81, VitD5000; we reviewed diet &  exercise program...   ~  September 30, 2015:  89mo ROV & annual CPX>  [name] [name] reports doing well, feels good, no new complaints or concerns;  She remains active walking her [place] [employer] "[name] [name]" 5mi/d;  We reviewed the following medical problems during today's office visit >>     HBP> on ASA81 & LOSAR100; BP= 138/64 & she denies CP, palpit, dizzy, SOB, edema, etc; continue same & low sodium...    VV> prev eval at vein clinic DrFeatherston w/ surg; she knows to elim sodium, elev legs, wear support hose; denies pain, swelling, leg lesions...    CHOL> on diet alone; last FLP 1/16 showed TChol 237, TG 71, HDL 78, LDL 145; we reviewed low chol/ low fat diet...    GI- GERD, Polyps> on OTC Prilosec vs Zantac prn; f/u colon 11/12 by DrPatterson was neg & he rec f/u 84yrs...    GYN> on Premarin0.3 and BlackCohosh per Andree Coss (seen yearly); she is s/p hyst; mammograms neg at [facility] annually (last 10/16)...    VitD defic> VitD level 1/16 = 17 & asked to incr OTC supplement to 5000u daily but she didn't do it.Marland KitchenMarland Kitchen  Anxiety> on Ativan 0.5mg - 1/2 to 1 Tid prn; stress w/ care of motherLorenza [name]; med refilled...    Hx idiopathic urticaria> she believed this was due to wt lifting in the past; no recurrence... EXAM shows Afeb, VSS, O2sat=100% on RA;  HEENT- neg, mallampati2;  Chest- clear w/o w/r/r;  Heart- RR w/o m/r/g;  Abd- soft, neg;  Ext- neg w/o c/c/e;  Neuro- intact...  FASTING LABS>  She is not fasting today & will ret for labs at her convenience...  IMP/PLAN>>  [name] [name] is stable & rec to continue same meds, ret for FLabs, consider low dose statin med for her lipids (she declines)...  ~  Jan 19, 2016:  99mo ROV & post hospital visit>  [name] [name] was [facility] 3/28 - 12/16/15 by [facility] w/ dizziness of several wks duration, developed weakness & fell (called 911); no HA, no LOC, focal weakness, speech disturbance, etc; In the ER her exam was NEG & an MRI Brain showed a sm 7mm T2enhancing lesion in right temporal  lobe cortex c/w meningioma, no acute infarct, tiny chr left cbll infarct;  Neuro was consulted & they wondered if the dizziness may be an epileptiform seizure & started Keppra rx=> EEG was NEG; Neuro wanted [facility] to do a ?metastatic? work-up- this was NEG as well (see below); Disch on Keppra500Bid for Neuro follow up...    In the interim the pt had some milder dizziness but noted that BP was also elev at home & at [facility] office ~190/90 & he felt that the BP likely caused the dizziness, not a seizure, and the meningioma is likely incidental; pt has f/u appt w/ NS, DrDitty (she indicates that they will follow this lesion);  She called w/ BP elev & we called in additional meds adding Bystolic10 to her Losartan100... EXAM shows Afeb, BP=160/80, O2sat=98% on RA;  BP recently elev from 120/80 to 190/100, HEENT- neg, mallampati2;  Chest- clear w/o w/r/r;  Heart- RR w/o m/r/g;  Abd- soft, +faint abd bruit heard;  Ext- neg w/o c/c/e;  Neuro- intact...  LABS 11/2015> reviewed  CT Chest was neg- showing norm heart size, no adenopathy, no PE seen; no consolidative airsp dis/ nodules/ or masses, no effusion etc; mild degen changes in Tspine, bilat breast prosthesis...  CT Abd&Pelvis showed norm liver/ GB/ pancreas/ spleen; normal kidneys, adrenals, bladder; stomach & bowels ok w/ mild divertics, s/p hyst, no adenopathy; nonspecific sclerotic foci in sternum, T5 & L4=> rec bone scan.  Nuclear Med Bone scan 12/29/15> NEG, no abn uptake... IMP/PLAN>>  She has had signif elev in BP along w/ dizziness & the weak spell that resulted in ER visit & 1d Hosp; seems more likely that BP caused the dizziness & hopefully the 7mm meningioma is just incidental (she tells me that NS, DrDitty will be following this lesion);  Her BP has been easily controlled in the past & just suddenly elev & requiring increased medication, coupled w/ Abd bruit on exam=> proceed w/ CTA of abd & pelvis to check Ao & bilat renal arteries... For now  we will Add Amlodipine5 to her regimen...  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 We will refer to Endovascular specialist for further eval.  Bilat renal  arteriograms 02/11/16 by DrChen>  bilat renal art fibromuscular dysplasia but w/o hemodynamic stenoses seen...   ~  March 23, 2016:  63mo ROV & [name] [name] had her renal arteriogram by DrChen as above +FMD but no signif stenosis;  Her BP has been satis controlled on her 3 meds:  Bystolic10, Amlodipine5, Losartan100 => BP=124/78 today she pt confirms similar readings at home (she notes her high has been ~150sys);  No new complaints or concerns- she states that she feels OK, just one episode of dizziness recently & since she has stopped the Keppra (6/14) & started driving again she notes the exit ramps w/ steep turn to the right brings on the dizziness!  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 She will follow up w/ [place] in ~56months           Problem List:  HYPERTENSION (ICD-401.9) & Bilateral RENAL ART FIBROMUSCULAR DYSPLASIA identified 01/2016 >>  ~  Prev on AVAPRO 150mg /d + HCTZ 12.5mg /d & K20 added 9/12 (taking 1/2 tab daily she says) w/ good control...  ~  10/11:  BP= 126/78, tolerating rx well... denies HA, fatigue, visual changes, CP,  palipit, dizziness, syncope, dyspnea, edema, etc... ~  1/13:  BP= 132/80 & her fatigue is resolved, feeling better, currently asymptomatic... ~  4/14: on ASA81, Avapro150, HCT12.5, K20; BP= 138/78 & she denies CP, palpit, dizzy, SOB, edema, etc. ~  11/15: on ASA81 & ran out of Avapro150/ HCT12.5/ K20- all ~59mo ago; BP= 180/90 & she denies CP, palpit, dizzy, SOB, edema, etc; we decided to change to LOSARTAN100mg /d, low sodium, etc. ~  1/16: on ASA81 & Cozaar100; BP= 142/90 but hasn't taken meds today; remains asymptomatic; reminded to take med daily in AM... ~  7/16: BP controlled on her regular Cozaar100 (tol well) w/ BP=134/70 today & she denies HA, visual sx, CP, palpit, SOB, edema, etc... ~  1/17: on ASA81 & LOSAR100; BP= 138/64 & she denies CP, palpit, dizzy, SOB, edema, etc; continue same & low sodium. ~  01/2016> she was [facility] briefly w/ dizziness & weakness, MRI showed a 7mm meningioma in the right temporal lobe, placed on Keppra but subseq EEGs were neg, BP meds adjusted w/ Bystolic10 & Amlod5 added to her Losar100; Exam revealed a faint abd bruit=> CTA Abd showed Bilat renal art fibromuscular hyperplasia beading changes... refewrred to [facility] specialist for further eval. ~  02/11/16> she had bilat renal arteriography confirming the presence of bilat fibromuscular dysplasia BUT no signif RA stenoses... ~  BP appears to be well controlled on Bystolic10, Amlodipine5, Losartan100...  VARICOSE VEINS, LOWER EXTREMITIES (ICD-454.9) - she has been eval at the Vein clinic and had [facility] surg by DrFeatherston (sclerotherapy w/o help she says)... she avoids sodium, elevates legs, wears support hose...  HYPERCHOLESTEROLEMIA (ICD-272.0) - on diet alone and has a high HDL... ~  last FLP was 6/05 showing TChol 236, Tg 148, HDL 83, LDL 125... I told her she may need a low dose statin med to get her TChol & LDL to goal... ~  FLP 10/10 showed  TChol 222, TG 63, HDL 92, LDL 118... rec better diet effort. ~  FLP  1/13 on diet alone showed  TChol 205, TG 27, HDL 92, LDL 98 ~  FLP 1/16 on diet alone showed TChol 237, TG 71, HDL 78, LDL 145... rec to start [facility]- 1/2 tab daily (she chose not to & prefers diet alone)... ~  FLP 1/17 on diet alone - TChol 237, TG 71, HDL 78, LDL 145 and she declines to consider med rx, prefers diet alone despite these numbers...  GERD (ICD-530.81) - occas reflux symptoms treated w/ Prilosec vs Zantac OTC...  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 follow up planned 81yrs & we will refer to GI for this important screening procedure. ~  F/u colonoscopy done 11/12 by DrPatterson was neg> no polyps or cancers, no rectal mass, f/u suggested in 62yrs...  HEADACHE (ICD-784.0) - she believes related to stress...  VITAMIN D DEFICIENCY >> ~  Labs 4/14 showed Vit D level = 32 and she is rec to start OTC Vit D supplement ~1000u daily (she didn't do it). ~  Labs 1/16 showed Vit D level = 17 and she is rec to start OTC Vit D supplement = 5000u daily...  MENINGIOMA >> MRI Brain 11/2015 showed a sm 7mm T2enhancing lesion in right temporal lobe cortex c/w meningioma, no acute infarct, tiny chr left cbll infarct;  Neuro was consulted & they wondered if the dizziness may be an epileptiform seizure & started Keppra rx=> EEG was NEG; Neuro wanted [facility] to do a ?metastatic? work-up- this was NEG as well (see below); Disch on Keppra500Bid for Neuro follow up & DrPenumalli felt the meningioma was prob incidental, pt indcatres that she will be followed by NS, DrDitty...  ANXIETY (ICD-300.00) - she uses LORAZEPAM 0.5mg  as needed for anxiety... eg. during storms etc...  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  Health Maintenance:  Also takes ASA 81mg /d... GYN= DrHorvath, on Premarin 0.3mg /d, s/p hysterectomy... Mammograms yearly at  [facility] (she has implants)- and they follow closely... she gets the seasonal Flu vaccine yearly... given TDAP booster 10/11...   Past Surgical History  Procedure Laterality Date  . Vaginal hysterectomy    . Bilateral breast implants    . Benign breast biopsy    . Varicose vein    . Cataract right eye w/ lens implant    . Excision of large anal polyp    . Colonoscopy    . Polypectomy    . Peripheral vascular catheterization N/A 02/11/2016    Procedure: Renal Angiography;  Surgeon: Fransisco Hertz, MD;  Location: [facility] INVASIVE CV LAB;  Service: Cardiovascular;  Laterality: N/A;    Outpatient Encounter Prescriptions as of 03/23/2016  Medication Sig  . amLODipine (NORVASC) 5 MG tablet Take 1 tablet (5 mg total) by mouth daily.  Marland Kitchen aspirin EC 81 MG tablet Take 81 mg by mouth daily.  . cholecalciferol (VITAMIN D) 1000 units tablet Take 1,000 Units by mouth daily.  Marland Kitchen estrogens, conjugated, (PREMARIN) 0.3 MG tablet Take 0.3 mg by mouth See admin instructions. Take daily for 21 days then do not take for 7 days.  Marland Kitchen losartan (COZAAR) 100 MG tablet TAKE 1 TABLET (100 MG TOTAL) BY MOUTH DAILY.  . nebivolol (BYSTOLIC) 10 MG tablet Take 1 tablet (10 mg total) by mouth daily.  . [  DISCONTINUED] Cholecalciferol (VITAMIN D3) 5000 units TABS Take 5,000 Units by mouth daily. Reported on 03/23/2016  . [DISCONTINUED] levETIRAcetam (KEPPRA) 500 MG tablet Take 500 mg by mouth daily. Reported on 03/23/2016   No facility-administered encounter medications on file as of 03/23/2016.    No Known Allergies    Immunization History  Administered Date(s) Administered  . Influenza Split 06/13/2011, 07/19/2012  . Influenza Whole 07/14/2009, 07/14/2010  . Influenza,inj,Quad PF,36+ Mos 08/13/2013, 07/28/2014, 08/25/2015  . Td 07/14/2010    Current Medications, Allergies, Past Medical History, Past Surgical History, Family History, and Social History were reviewed in [employer] record.    Review of  Systems       See HPI - all other systems neg except as noted...  The patient denies anorexia, fever, weight loss, weight gain, vision loss, decreased hearing, hoarseness, chest pain, syncope, dyspnea on exertion, peripheral edema, prolonged cough, headaches, hemoptysis, abdominal pain, melena, hematochezia, severe indigestion/heartburn, hematuria, incontinence, muscle weakness, suspicious skin lesions, transient blindness, difficulty walking, depression, unusual weight change, abnormal bleeding, enlarged lymph nodes, and angioedema.    Objective:   Physical Exam     WD, WN, 65 y/o BF in NAD... GENERAL:  Alert & oriented; pleasant & cooperative... HEENT:  [place]/AT, EOM-wnl, PERRLA, EACs-clear, TMs-wnl, NOSE-clear, THROAT-clear & wnl. NECK:  Supple w/ full ROM; no JVD; normal carotid impulses w/o bruits; no thyromegaly or nodules palpated; no lymphadenopathy. CHEST:  Clear to P & A; without wheezes/ rales/ or rhonchi. HEART:  Regular Rhythm; without murmurs/ rubs/ or gallops. ABDOMEN:  Soft & nontender; normal bowel sounds & faint abd bruit heard; no organomegaly or masses detected. EXT: without deformities or arthritic changes; no varicose veins/ venous insuffic/ or edema. NEURO:  CN's intact; motor testing normal; sensory testing normal; gait normal & balance OK. DERM:  No lesions noted; no rash etc...  RADIOLOGY DATA:  Reviewed in the EPIC EMR & discussed w/ the patient...  LABORATORY DATA:  Reviewed in the EPIC EMR & discussed w/ the patient...   Assessment:      HBP> started on LOSARTAN100 11/15 and BP improved on Rx; not checking BP at home & reminded to do so; f/u BP 2017 as high has 200/100 and meds added=> Bystolic10 & Amlod5 w/ good control... 01/2016>  Further eval w/ CTA abd revealed bilat renal fibromusc hyperplasia & pt referred to [facility] specialist... 02/11/16> she had  bilat renal arteriography confirming the presence of bilat fibromuscular dysplasia BUT no signif RA  stenoses... BP remains well controlled on her 3 med regimen...   VV, VI>  Prev eval at the Vein clinic w/ surg by DrFeatherston...  CHOL>  On diet alone & FLP has not been at goal- she will ret for FLP soon...  GI> GERD, Polyps>  She uses OTC acid suppressors & had f/u colonoscopy 11/12 (neg)...  Hx HAs>  She denies recent HAs etc...  Vit D Defic>  She was not taking VitD supplements as requested; labs 1/16 showed Vit D level = 17 & she s rec to start VitD 5000u daily but she reverted to OTC 1000u supplement!  MENINGIOMA>>  7mm right temp lobe lesion eval by Neuro & NS 11/2015 Hosp... They are following.  Anxiety>  She uses Lorazepam as needed & requests refill of this med...     Plan:     Patient's Medications  New Prescriptions   No medications on file  Previous Medications   AMLODIPINE (NORVASC) 5 MG TABLET    Take  1 tablet (5 mg total) by mouth daily.   ASPIRIN EC 81 MG TABLET    Take 81 mg by mouth daily.   CHOLECALCIFEROL (VITAMIN D) 1000 UNITS TABLET    Take 1,000 Units by mouth daily.   ESTROGENS, CONJUGATED, (PREMARIN) 0.3 MG TABLET    Take 0.3 mg by mouth See admin instructions. Take daily for 21 days then do not take for 7 days.   LOSARTAN (COZAAR) 100 MG TABLET    TAKE 1 TABLET (100 MG TOTAL) BY MOUTH DAILY.   NEBIVOLOL (BYSTOLIC) 10 MG TABLET    Take 1 tablet (10 mg total) by mouth daily.  Modified Medications   No medications on file  Discontinued Medications   CHOLECALCIFEROL (VITAMIN D3) 5000 UNITS TABS    Take 5,000 Units by mouth daily. Reported on 03/23/2016   LEVETIRACETAM (KEPPRA) 500 MG TABLET    Take 500 mg by mouth daily. Reported on 03/23/2016

## 2016-05-09 ENCOUNTER — Ambulatory Visit (INDEPENDENT_AMBULATORY_CARE_PROVIDER_SITE_OTHER): Payer: Federal, State, Local not specified - PPO | Admitting: Diagnostic Neuroimaging

## 2016-05-09 ENCOUNTER — Encounter: Payer: Self-pay | Admitting: Diagnostic Neuroimaging

## 2016-05-09 VITALS — BP 127/69 | HR 62 | Wt 132.8 lb

## 2016-05-09 DIAGNOSIS — I10 Essential (primary) hypertension: Secondary | ICD-10-CM | POA: Diagnosis not present

## 2016-05-09 DIAGNOSIS — R42 Dizziness and giddiness: Secondary | ICD-10-CM | POA: Diagnosis not present

## 2016-05-09 DIAGNOSIS — I773 Arterial fibromuscular dysplasia: Secondary | ICD-10-CM

## 2016-05-09 DIAGNOSIS — D329 Benign neoplasm of meninges, unspecified: Secondary | ICD-10-CM

## 2016-05-09 NOTE — Progress Notes (Signed)
[facility]  PATIENT: [name] [name] DOB: 1950-11-16  REFERRING CLINICIAN: Lynden Oxford HISTORY FROM: patient  REASON FOR VISIT: follow up    HISTORICAL  CHIEF COMPLAINT:  Chief Complaint  Patient presents with  . Dizziness    rm 7, "doing better, get dizzy if I watch cars go by while sitting at a light"  . Follow-up    3 month    HISTORY OF PRESENT ILLNESS:   UPDATE 05/09/16: Since last visit, patient doing better. BP better controlled. Dizziness is much improved. Now retired. Overall improved.   PRIOR HPI (02/04/16): 65 year old right-handed female here for evaluation of dizziness, elevated blood pressure, meningioma, possible seizure. Patient has history of hypertension and is on medication. Over the past several months patient has been having intermittent waves of dizzy spells, while sitting, standing or exerting herself. She has never had loss of consciousness or spinning sensation. She describes a lightheaded sensation and the wave of funny feeling that moves from the left to the right side of her head. On 12/15/15 patient had a severe attack with nausea and vomiting. She felt like she was dying and was able to call 911 and unlock the door. Blood pressure was noted to be 220/180 patient was taken to the hospital for evaluation. 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 Neurology consult was obtained and patient was recommended to start levetiracetam for seizure prevention. Patient also has follow-up with neurosurgery for follow-up of this possible meningioma. Since discharge patient is feeling better. She has not returned to work. She is not driving. She does not have a blood pressure machine at home.   REVIEW OF SYSTEMS: Full 14 system review of systems performed and negative with exception of: only as per HPI.   ALLERGIES: No Known Allergies  HOME MEDICATIONS: Outpatient Medications Prior to Visit  Medication Sig Dispense Refill  .  amLODipine (NORVASC) 5 MG tablet Take 1 tablet (5 mg total) by mouth daily. 30 tablet 11  . aspirin EC 81 MG tablet Take 81 mg by mouth daily.    . cholecalciferol (VITAMIN D) 1000 units tablet Take 1,000 Units by mouth daily.    Marland Kitchen estrogens, conjugated, (PREMARIN) 0.3 MG tablet Take 0.3 mg by mouth See admin instructions. Take daily for 21 days then do not take for 7 days.    Marland Kitchen losartan (COZAAR) 100 MG tablet TAKE 1 TABLET (100 MG TOTAL) BY MOUTH DAILY. 90 tablet 2  . nebivolol (BYSTOLIC) 10 MG tablet Take 1 tablet (10 mg total) by mouth daily. 30 tablet 12   No facility-administered medications prior to visit.     PAST MEDICAL HISTORY: Past Medical History:  Diagnosis Date  . Anxiety   . Colonic polyp   . FMD (facioscapulohumeral muscular dystrophy) (HCC)   . GERD (gastroesophageal reflux disease)   . Headache(784.0)   . Hypercholesterolemia   . Hypertension   . Idiopathic urticaria   . Varicose veins of lower extremities     PAST SURGICAL HISTORY: Past Surgical History:  Procedure Laterality Date  . benign breast biopsy    . bilateral breast implants    . cataract right eye w/ lens implant    . COLONOSCOPY    . excision of large anal polyp    . PERIPHERAL VASCULAR CATHETERIZATION N/A 02/11/2016   Procedure: Renal Angiography;  Surgeon: Fransisco Hertz, MD;  Location: [facility] INVASIVE CV LAB;  Service: Cardiovascular;  Laterality: N/A;  . POLYPECTOMY    .  VAGINAL HYSTERECTOMY    . varicose vein      FAMILY HISTORY: Family History  Problem Relation Age of Onset  . Hypertension Mother   . Hyperlipidemia Mother   . Diabetes Mother   . Breast cancer Mother   . Pulmonary embolism Mother   . Deep vein thrombosis Mother   . Dementia Father   . Sickle cell anemia Cousin   . Colon cancer Neg Hx   . Esophageal cancer Neg Hx   . Stomach cancer Neg Hx     SOCIAL HISTORY:  Social History   Social History  . Marital status: Single    Spouse name: N/A  . Number of children: 0    . Years of education: 12   Occupational History  .      [employer]   Social History Main Topics  . Smoking status: Never Smoker  . Smokeless tobacco: Not on file  . Alcohol use No  . Drug use: No  . Sexual activity: Not on file   Other Topics Concern  . Not on file   Social History Narrative   Lives alone   No caffeine use     PHYSICAL EXAM  GENERAL EXAM/CONSTITUTIONAL: Vitals:  Vitals:   05/09/16 1324  BP: 127/69  Pulse: 62  Weight: 132 lb 12.8 oz (60.2 kg)   Body mass index is 24.29 kg/m. No exam data present  Patient is in no distress; well developed, nourished and groomed; neck is supple  CARDIOVASCULAR:  Examination of carotid arteries is normal; no carotid bruits  Regular rate and rhythm, no murmurs  Examination of peripheral vascular system by observation and palpation is normal  EYES:  Ophthalmoscopic exam of optic discs and posterior segments is normal; no papilledema or hemorrhages  MUSCULOSKELETAL:  Gait, strength, tone, movements noted in Neurologic exam below  NEUROLOGIC: MENTAL STATUS:  No flowsheet data found.  awake, alert, oriented to person, place and time  recent and remote memory intact  normal attention and concentration  language fluent, comprehension intact, naming intact,   fund of knowledge appropriate  CRANIAL NERVE:   2nd - no papilledema on fundoscopic exam  2nd, 3rd, 4th, 6th - pupils equal and reactive to light, visual fields full to confrontation, extraocular muscles intact, no nystagmus  5th - facial sensation symmetric  7th - facial strength symmetric  8th - hearing intact  9th - palate elevates symmetrically, uvula midline  11th - shoulder shrug symmetric  12th - tongue protrusion midline  MOTOR:   normal bulk and tone, full strength in the BUE, BLE  SENSORY:   normal and symmetric to light touch, temperature, vibration  COORDINATION:   finger-nose-finger, fine finger movements  normal  REFLEXES:   deep tendon reflexes present and symmetric  GAIT/STATION:   narrow based gait; romberg is negative    DIAGNOSTIC DATA (LABS, IMAGING, TESTING) - I reviewed patient records, labs, notes, testing and imaging myself where available.  Lab Results  Component Value Date   WBC 5.6 02/22/2016   HGB 12.5 02/22/2016   HCT 37.1 02/22/2016   MCV 87.5 02/22/2016   PLT 319.0 02/22/2016      Component Value Date/Time   NA 141 02/22/2016 1149   K 3.6 02/22/2016 1149   CL 102 02/22/2016 1149   CO2 32 02/22/2016 1149   GLUCOSE 85 02/22/2016 1149   BUN 20 02/22/2016 1149   CREATININE 0.96 02/22/2016 1149   CALCIUM 9.8 02/22/2016 1149   PROT 7.7 12/15/2015 1020  ALBUMIN 3.9 12/15/2015 1020   AST 42 (H) 12/15/2015 1020   ALT 20 12/15/2015 1020   ALKPHOS 78 12/15/2015 1020   BILITOT 0.7 12/15/2015 1020   GFRNONAA >60 12/15/2015 1020   GFRAA >60 12/15/2015 1020   Lab Results  Component Value Date   CHOL 237 (H) 09/29/2014   HDL 77.70 09/29/2014   LDLCALC 145 (H) 09/29/2014   LDLDIRECT 97.9 10/21/2011   TRIG 71.0 09/29/2014   CHOLHDL 3 09/29/2014   No results found for: HGBA1C No results found for: VITAMINB12 Lab Results  Component Value Date   TSH 3.10 02/22/2016    12/15/15 MRI brain (without) / MRA head (without) [I reviewed images myself and agree with interpretation. -VRP]  1. No acute infarct. 2. Tiny chronic left cerebellar infarct. 3. New 7 mm T2 hyperintense lesion involving anterior right temporal lobe cortex, indeterminate. Postcontrast imaging is recommended to assess for small neoplasm.  4. No major intracranial arterial occlusion or significant stenosis.  12/15/15 MRI brain (with) - Subcentimeter meningioma is demonstrated along the floor of the RIGHT middle cranial fossa.  12/15/15 CT c/a/p 1. Nonspecific small sclerotic foci in the sternum and T5 and L4 vertebral bodies. Consider correlation with a bone scintigraphy study on a short term  outpatient basis. 2. Otherwise no potential sites of metastatic disease in the chest, abdomen or pelvis. 3. Minimal tree-in-bud opacity in the right middle lobe without bronchiectasis, favoring a minimal infectious or inflammatory bronchiolitis of doubtful clinical significance. 4. Mild sigmoid diverticulosis.  12/16/15 EEG  - This is a normal EEG for the patients stated age. There were no focal, hemispheric or lateralizing features. No epileptiform activity was recorded. A normal EEG does not exclude the diagnosis of a seizure disorder and if seizure remains high on the list of differential diagnosis, an ambulatory EEG may be of value. Clinical correlation is required.  12/25/15 EKG [I reviewed images myself and agree with interpretation. -VRP]  - normal sinus rhythm  01/01/16 EEG  - Normal EEG in the awake and asleep states.  01/22/16 CT angiogram abd/pelvis - There are characteristic findings associated with fibromuscular dysplasia of the renal arteries. Endovascular therapy may provide relief from hypertension. Endovascular specialist consultation is recommended.      ASSESSMENT AND PLAN  65 y.o. year old female here with history of hypertension, with new onset episode on 12/15/15 of severe dizziness, lightheadedness, nausea, vomiting associated with elevated blood pressure 220/180. Most likely represents hypertensive emergency. Patient also found to have likely incidental right temporal meningioma. Patient was started empirically on antiseizure medication in the hospital.   Now BP better and patient doing well.    Dx: hypertensive emergency + meningioma (incidental)  1. Dizziness and giddiness   2. Accelerated hypertension   3. Meningioma (HCC)   4. Fibromuscular dysplasia (HCC)      PLAN: - follow up with neurosurgery and MRI brain for meningioma follow up (I think it is likely incidental) - follow up BP readings with PCP  Return if symptoms worsen or fail to improve, for  return to PCP.    Suanne Marker, MD 05/09/2016, 1:34 PM Certified in Neurology, Neurophysiology and Neuroimaging  [facility] [address], [address] [place], [place] [address] [phone]

## 2016-05-09 NOTE — Patient Instructions (Signed)
Thank you for coming to see [place] at [facility]. I hope we have been able to provide you high quality care today.  You may receive a patient satisfaction survey over the next few weeks. We would appreciate your feedback and comments so that we may continue to improve ourselves and the health of our patients.  - follow up as needed   ~~~~~~~~~~~~~~~~~~~~~~~~~~~~~~~~~~~~~~~~~~~~~~~~~~~~~~~~~~~~~~~~~  DR. Sydne Krahl'S GUIDE TO HAPPY AND HEALTHY LIVING These are some of my general health and wellness recommendations. Some of them may apply to you better than others. Please use common sense as you try these suggestions and feel free to ask me any questions.   ACTIVITY/FITNESS Mental, social, emotional and physical stimulation are very important for brain and body health. Try learning a new activity (arts, music, language, sports, games).  Keep moving your body to the best of your abilities. You can do this at home, inside or outside, the park, community center, gym or anywhere you like. Consider a physical therapist or personal trainer to get started. Consider the app Sworkit. Fitness trackers such as smart-watches, smart-phones or Fitbits can help as well.   NUTRITION Eat more plants: colorful vegetables, nuts, seeds and berries.  Eat less sugar, salt, preservatives and processed foods.  Avoid toxins such as cigarettes and alcohol.  Drink water when you are thirsty. Warm water with a slice of lemon is an excellent morning drink to start the day.  Consider these websites for more information The Nutrition Source (https://www.henry-hernandez.biz/) [employer] Nutrition (WindowBlog.ch)   RELAXATION Consider practicing mindfulness meditation or other relaxation techniques such as deep breathing, prayer, yoga, tai chi, massage. See website mindful.org or the apps Headspace or Calm to help get started.   SLEEP Try to get at least  7-8+ hours sleep per day. Regular exercise and reduced caffeine will help you sleep better. Practice good sleep hygeine techniques. See website sleep.org for more information.   PLANNING Prepare estate planning, living will, healthcare POA documents. Sometimes this is best planned with the help of an attorney. Theconversationproject.org and agingwithdignity.org are excellent resources.

## 2016-05-26 ENCOUNTER — Encounter (INDEPENDENT_AMBULATORY_CARE_PROVIDER_SITE_OTHER): Payer: Self-pay

## 2016-05-26 ENCOUNTER — Encounter: Payer: Self-pay | Admitting: Pulmonary Disease

## 2016-05-26 ENCOUNTER — Ambulatory Visit (INDEPENDENT_AMBULATORY_CARE_PROVIDER_SITE_OTHER): Payer: Federal, State, Local not specified - PPO | Admitting: Pulmonary Disease

## 2016-05-26 VITALS — BP 108/64 | HR 58 | Temp 97.4°F | Resp 16 | Ht 62.0 in | Wt 131.0 lb

## 2016-05-26 DIAGNOSIS — I8390 Asymptomatic varicose veins of unspecified lower extremity: Secondary | ICD-10-CM

## 2016-05-26 DIAGNOSIS — D329 Benign neoplasm of meninges, unspecified: Secondary | ICD-10-CM

## 2016-05-26 DIAGNOSIS — Z23 Encounter for immunization: Secondary | ICD-10-CM

## 2016-05-26 DIAGNOSIS — E7800 Pure hypercholesterolemia, unspecified: Secondary | ICD-10-CM

## 2016-05-26 DIAGNOSIS — F411 Generalized anxiety disorder: Secondary | ICD-10-CM

## 2016-05-26 DIAGNOSIS — I10 Essential (primary) hypertension: Secondary | ICD-10-CM | POA: Diagnosis not present

## 2016-05-26 DIAGNOSIS — I773 Arterial fibromuscular dysplasia: Secondary | ICD-10-CM

## 2016-05-26 DIAGNOSIS — I7789 Other specified disorders of arteries and arterioles: Secondary | ICD-10-CM

## 2016-05-26 NOTE — Patient Instructions (Signed)
Today we updated your med list in our EPIC system...    Continue your current medications the same...  Keep up the good work w/ diet & exercise...  Call for any questions...  Let's plan a follow up visit in 55mo, sooner if needed for problems.Marland KitchenMarland Kitchen

## 2016-05-26 NOTE — Progress Notes (Signed)
Subjective:     Patient ID: [name] [name], female   DOB: 18-Aug-1951, 65 y.o.   MRN: [id number]  HPI 65 y/o BF here for a follow up visit... she has mult med problems as noted....  ~  SEE PREV EPIC NOTES FOR OLDER DATA >>    LABS 4/14:  Chems- wnl w/ K=3.6;  CBC- wnl;  TSH=1.90;  VitD=32 & rec to take 1000u OTC supplement daily...  CXR 11/15 showed norm heart size, clear lungs, NAD.Marland KitchenMarland Kitchen  EKG 11/15 showed NSR, rate71, wnl, NAD...  ~  September 29, 2014:  45mo ROV & [name] [name] is here for a follow up on her BP- now back on meds= Cozaar100 + low salt diet etc;  She notes that she has not yet taken today's dose & BP here is 142/90; she has not been checking her BP at home; med tolerated well & she is feeling well w/o CP/ palpit/ SOB/ edema/ etc... She did her FASTING blood work today (see below)...    CHOL> on diet alone; FLP 1/16 showed TChol 237, TG 71, HDL 78, LDL 145; this is the worst she has had & rec to start Simva40- take 1/2 tab daily...    Vit D Defic> Labs 1/16 showed Vit D level = 17 & rec to start OTC VitD supplement ~5000u daily... We reviewed prob list, meds, xrays and labs> see below for updates >>   LABS 1/16:  FLP- not at goals on diet alone w/ LDL=145;  Chems- ok x K=3.3 & rec dietary supplement;  CBC- wnl;  TSH=1.78;  VitD=17...  ~  March 30, 2015:  20mo ROV & [name] [name] reports doing well, BP controlled on her regular Cozaar100 (tol well) w/ BP=134/70 today & she denies HA, visual sx, CP, palpit, SOB, edema, etc... She decided NOT to restart the Simva20 & wants to control her Chol w/ diet + exercise (we reviewed these today...  Finally she has continued the VitD supplement 5000u daily... EXAM shows Afeb, VSS, O2sat=99% on RA;  HEENT- neg, mallampati1;  Chest- clear w/o w/r/r;  Heart- RR w/o m/r/g;  Abd- neg- soft, nondender;  Ext- w/o c/c/e;  Neuro- intact... We reviewed prob list, meds, xrays and labs>  PLAN>>  Seham is stable on her diet + Cozaar100, ASA81, VitD5000; we reviewed diet &  exercise program...   ~  September 30, 2015:  20mo ROV & annual CPX>  [name] [name] reports doing well, feels good, no new complaints or concerns;  She remains active walking her [place] [employer]ky "[name]" 80mi/d;  We reviewed the following medical problems during today's office visit >>     HBP> on ASA81 & LOSAR100; BP= 138/64 & she denies CP, palpit, dizzy, SOB, edema, etc; continue same & low sodium...    VV> prev eval at vein clinic DrFeatherston w/ surg; she knows to elim sodium, elev legs, wear support hose; denies pain, swelling, leg lesions...    CHOL> on diet alone; last FLP 1/16 showed TChol 237, TG 71, HDL 78, LDL 145; we reviewed low chol/ low fat diet...    GI- GERD, Polyps> on OTC Prilosec vs Zantac prn; f/u colon 11/12 by DrPatterson was neg & he rec f/u 104yrs...    GYN> on Premarin0.3 and BlackCohosh per Andree Coss (seen yearly); she is s/p hyst; mammograms neg at [facility] annually (last 10/16)...    VitD defic> VitD level 1/16 = 17 & asked to incr OTC supplement to 5000u daily but she didn't do it.Marland KitchenMarland Kitchen  Anxiety> on Ativan 0.5mg - 1/2 to 1 Tid prn; stress w/ care of motherLorenza Cambridge; med refilled...    Hx idiopathic urticaria> she believed this was due to wt lifting in the past; no recurrence... EXAM shows Afeb, VSS, O2sat=100% on RA;  HEENT- neg, mallampati2;  Chest- clear w/o w/r/r;  Heart- RR w/o m/r/g;  Abd- soft, neg;  Ext- neg w/o c/c/e;  Neuro- intact...  FASTING LABS>  She is not fasting today & will ret for labs at her convenience...  IMP/PLAN>>  [name] [name] is stable & rec to continue same meds, ret for FLabs, consider low dose statin med for her lipids (she declines)...  ~  Jan 19, 2016:  77mo ROV & post hospital visit>  [name] [name] was [facility] 3/28 - 12/16/15 by [facility] w/ dizziness of several wks duration, developed weakness & fell (called 911); no HA, no LOC, focal weakness, speech disturbance, etc; In the ER her exam was NEG & an MRI Brain showed a sm 7mm T2enhancing lesion in right temporal  lobe cortex c/w meningioma, no acute infarct, tiny chr left cbll infarct;  Neuro was consulted & they wondered if the dizziness may be an epileptiform seizure & started Keppra rx=> EEG was NEG; Neuro wanted [facility] to do a ?metastatic? work-up- this was NEG as well (see below); Disch on Keppra500Bid for Neuro follow up...    In the interim the pt had some milder dizziness but noted that BP was also elev at home & at [facility] office ~190/90 & he felt that the BP likely caused the dizziness, not a seizure, and the meningioma is likely incidental; pt has f/u appt w/ NS, DrDitty (she indicates that they will follow this lesion);  She called w/ BP elev & we called in additional meds adding Bystolic10 to her Losartan100... EXAM shows Afeb, BP=160/80, O2sat=98% on RA;  BP recently elev from 120/80 to 190/100, HEENT- neg, mallampati2;  Chest- clear w/o w/r/r;  Heart- RR w/o m/r/g;  Abd- soft, +faint abd bruit heard;  Ext- neg w/o c/c/e;  Neuro- intact...  LABS 11/2015> reviewed  CT Chest was neg- showing norm heart size, no adenopathy, no PE seen; no consolidative airsp dis/ nodules/ or masses, no effusion etc; mild degen changes in Tspine, bilat breast prosthesis...  CT Abd&Pelvis showed norm liver/ GB/ pancreas/ spleen; normal kidneys, adrenals, bladder; stomach & bowels ok w/ mild divertics, s/p hyst, no adenopathy; nonspecific sclerotic foci in sternum, T5 & L4=> rec bone scan.  Nuclear Med Bone scan 12/29/15> NEG, no abn uptake... IMP/PLAN>>  She has had signif elev in BP along w/ dizziness & the weak spell that resulted in ER visit & 1d Hosp; seems more likely that BP caused the dizziness & hopefully the 7mm meningioma is just incidental (she tells me that NS, DrDitty will be following this lesion);  Her BP has been easily controlled in the past & just suddenly elev & requiring increased medication, coupled w/ Abd bruit on exam=> proceed w/ CTA of abd & pelvis to check Ao & bilat renal arteries... For now  we will Add Amlodipine5 to her regimen...  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 We will refer to Endovascular specialist for further eval.  Bilat renal  arteriograms 02/11/16 by DrChen>  bilat renal art fibromuscular dysplasia but w/o hemodynamic stenoses seen...   ~  March 23, 2016:  10mo ROV & [name] [name] had her renal arteriogram by DrChen as above +FMD but no signif stenosis;  Her BP has been satis controlled on her 3 meds:  Bystolic10, Amlodipine5, Losartan100 => BP=124/78 today she pt confirms similar readings at home (she notes her high has been ~150sys);  No new complaints or concerns- she states that she feels OK, just one episode of dizziness recently & since she has stopped the Keppra (6/14) & started driving again she notes the exit ramps w/ steep turn to the right brings on the dizziness!  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 She will follow up w/ [place] in ~83months.  ~  May 26, 2016:  10mo ROV & [name] [name] reports that she is doing well now- she has retired from the post office & feeling well just very busy...    HBP> on ASA81, Amlod5, Bystolic10, Losar100; BP= 108/64 & she denies CP, palpit, dizzy, SOB, edema, etc; continue same & low sodium diet...    VV> prev eval at vein  clinic DrFeatherston w/ surg; she knows to elim sodium, elev legs, wear support hose; denies pain, swelling, leg lesions...    CHOL> on diet alone; last FLP 1/16 showed TChol 237, TG 71, HDL 78, LDL 145; not at goals but she does not want meds- we reviewed low chol/ low fat diet...    GI- GERD, Polyps> on OTC Prilosec vs Zantac prn; f/u colon 11/12 by DrPatterson was neg & he rec f/u 55yrs...    GYN> on Premarin0.3 and BlackCohosh per Andree Coss (seen yearly); she is s/p hyst; mammograms neg at [facility] annually (last 10/16)...    VitD defic> VitD level 1/16 = 17 & asked to incr OTC supplement to 5000u daily but she didn't do it...     Anxiety> prev on Ativan 0.5mg  prn; stress w/ care of motherLorenza Cambridge...     Hx idiopathic urticaria> she believed this was due to wt lifting in the past; no recurrence... EXAM shows Afeb, BP=160/80, O2sat=98% on RA;  BP recently elev from 120/80 to 190/100, HEENT- neg, mallampati2;  Chest- clear w/o w/r/r;  Heart- RR w/o m/r/g;  Abd- soft, +faint abd bruit heard;  Ext- neg w/o c/c/e;  Neuro- intact... IMP/PLAN>>  Khrystyna is much improved w/ resolution of most of her stress since she retired;  We discussed continuing current meds, diet, exercise...            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  ~  10/11:  BP= 126/78, tolerating rx well... denies HA, fatigue, visual changes, CP, palipit, dizziness, syncope, dyspnea, edema, etc... ~  1/13:  BP= 132/80 & her fatigue is resolved, feeling better, currently asymptomatic... ~  4/14: on ASA81, Avapro150, HCT12.5, K20; BP= 138/78 &  she denies CP, palpit, dizzy, SOB, edema, etc. ~  11/15: on ASA81 & ran out of Avapro150/ HCT12.5/ K20- all ~12mo ago; BP= 180/90 & she denies CP, palpit, dizzy, SOB, edema, etc; we decided to change to  LOSARTAN100mg /d, low sodium, etc. ~  1/16: on ASA81 & Cozaar100; BP= 142/90 but hasn't taken meds today; remains asymptomatic; reminded to take med daily in AM... ~  7/16: BP controlled on her regular Cozaar100 (tol well) w/ BP=134/70 today & she denies HA, visual sx, CP, palpit, SOB, edema, etc... ~  1/17: on ASA81 & LOSAR100; BP= 138/64 & she denies CP, palpit, dizzy, SOB, edema, etc; continue same & low sodium. ~  01/2016> she was [facility] briefly w/ dizziness & weakness, MRI showed a 7mm meningioma in the right temporal lobe, placed on Keppra but subseq EEGs were neg, BP meds adjusted w/ Bystolic10 & Amlod5 added to her Losar100; Exam revealed a faint abd bruit=> CTA Abd showed Bilat renal art fibromuscular hyperplasia beading changes... refewrred to [facility] specialist for further eval. ~  02/11/16> she had bilat renal arteriography confirming the presence of bilat fibromuscular dysplasia BUT no signif RA stenoses... ~  BP appears to be well controlled on Bystolic10, Amlodipine5, Losartan100...  VARICOSE VEINS, LOWER EXTREMITIES (ICD-454.9) - she has been eval at the Vein clinic and had [facility] surg by DrFeatherston (sclerotherapy w/o help she says)... she avoids sodium, elevates legs, wears support hose...  HYPERCHOLESTEROLEMIA (ICD-272.0) - on diet alone and has a high HDL... ~  last FLP was 6/05 showing TChol 236, Tg 148, HDL 83, LDL 125... I told her she may need a low dose statin med to get her TChol & LDL to goal... ~  FLP 10/10 showed TChol 222, TG 63, HDL 92, LDL 118... rec better diet effort. ~  FLP 1/13 on diet alone showed  TChol 205, TG 27, HDL 92, LDL 98 ~  FLP 1/16 on diet alone showed TChol 237, TG 71, HDL 78, LDL 145... rec to start [facility]- 1/2 tab daily (she chose not to & prefers diet alone)... ~  FLP 1/17 on diet alone - TChol 237, TG 71, HDL 78, LDL 145 and she declines to consider med rx, prefers diet alone despite these numbers...  GERD (ICD-530.81) - occas reflux symptoms  treated w/ Prilosec vs Zantac OTC...  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 follow up planned 41yrs & we will refer to GI for this important screening procedure. ~  F/u colonoscopy done 11/12 by DrPatterson was neg> no polyps or cancers, no rectal mass, f/u suggested in 39yrs...  HEADACHE (ICD-784.0) - she believes related to stress...  VITAMIN D DEFICIENCY >> ~  Labs 4/14 showed Vit D level = 32 and she is rec to start OTC Vit D supplement ~1000u daily (she didn't do it). ~  Labs 1/16 showed Vit D level = 17 and she is rec to start OTC Vit D supplement = 5000u daily...  MENINGIOMA >> MRI Brain 11/2015 showed a sm 7mm T2enhancing lesion in right temporal lobe cortex c/w meningioma, no acute infarct, tiny chr left cbll infarct;  Neuro was consulted & they wondered if the dizziness may be an epileptiform seizure & started Keppra rx=> EEG was NEG; Neuro wanted [facility] to do a ?metastatic? work-up- this was NEG as well (see below); Disch on Keppra500Bid for Neuro follow up & DrPenumalli felt the meningioma was prob incidental,  pt indcatres that she will be followed by NS, DrDitty...  ANXIETY (ICD-300.00) - she uses LORAZEPAM 0.5mg  as needed for anxiety... eg. during storms etc...  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  Health Maintenance:  Also takes ASA 81mg /d... GYN= DrHorvath, on Premarin 0.3mg /d, s/p hysterectomy... Mammograms yearly at [facility] (she has implants)- and they follow closely... she gets the seasonal Flu vaccine yearly... given TDAP booster 10/11...   Past Surgical History:  Procedure Laterality Date  . benign breast biopsy    . bilateral breast implants    . cataract right eye w/ lens implant    . COLONOSCOPY    . excision of large anal polyp    . PERIPHERAL VASCULAR CATHETERIZATION N/A 02/11/2016    Procedure: Renal Angiography;  Surgeon: Fransisco Hertz, MD;  Location: [facility] INVASIVE CV LAB;  Service: Cardiovascular;  Laterality: N/A;  . POLYPECTOMY    . VAGINAL HYSTERECTOMY    . varicose vein      Outpatient Encounter Prescriptions as of 05/26/2016  Medication Sig Dispense Refill  . amLODipine (NORVASC) 5 MG tablet Take 1 tablet (5 mg total) by mouth daily. 30 tablet 11  . aspirin EC 81 MG tablet Take 81 mg by mouth daily.    . cholecalciferol (VITAMIN D) 1000 units tablet Take 1,000 Units by mouth daily.    Marland Kitchen estrogens, conjugated, (PREMARIN) 0.3 MG tablet Take 0.3 mg by mouth See admin instructions. Take daily for 21 days then do not take for 7 days.    Marland Kitchen losartan (COZAAR) 100 MG tablet TAKE 1 TABLET (100 MG TOTAL) BY MOUTH DAILY. 90 tablet 2  . nebivolol (BYSTOLIC) 10 MG tablet Take 1 tablet (10 mg total) by mouth daily. 30 tablet 12   No facility-administered encounter medications on file as of 05/26/2016.     No Known Allergies    Immunization History  Administered Date(s) Administered  . Influenza Split 06/13/2011, 07/19/2012  . Influenza Whole 07/14/2009, 07/14/2010  . Influenza,inj,Quad PF,36+ Mos 08/13/2013, 07/28/2014, 08/25/2015, 05/26/2016  . Td 07/14/2010    Current Medications, Allergies, Past Medical History, Past Surgical History, Family History, and Social History were reviewed in [employer] record.    Review of Systems       See HPI - all other systems neg except as noted...  The patient denies anorexia, fever, weight loss, weight gain, vision loss, decreased hearing, hoarseness, chest pain, syncope, dyspnea on exertion, peripheral edema, prolonged cough, headaches, hemoptysis, abdominal pain, melena, hematochezia, severe indigestion/heartburn, hematuria, incontinence, muscle weakness, suspicious skin lesions, transient blindness, difficulty walking, depression, unusual weight change, abnormal bleeding, enlarged lymph nodes, and angioedema.     Objective:   Physical Exam     WD, WN, 65 y/o BF in NAD... GENERAL:  Alert & oriented; pleasant & cooperative... HEENT:  [place]/AT, EOM-wnl, PERRLA, EACs-clear, TMs-wnl, NOSE-clear, THROAT-clear & wnl. NECK:  Supple w/ full ROM; no JVD; normal carotid impulses w/o bruits; no thyromegaly or nodules palpated; no lymphadenopathy. CHEST:  Clear to P & A; without wheezes/ rales/ or rhonchi. HEART:  Regular Rhythm; without murmurs/ rubs/ or gallops. ABDOMEN:  Soft & nontender; normal bowel sounds & faint abd bruit heard; no organomegaly or masses detected. EXT: without deformities or arthritic changes; no varicose veins/ venous insuffic/ or edema. NEURO:  CN's intact; motor testing normal; sensory testing normal; gait normal & balance OK. DERM:  No lesions noted; no rash etc...  RADIOLOGY  DATA:  Reviewed in the EPIC EMR & discussed w/ the patient...  LABORATORY DATA:  Reviewed in the EPIC EMR & discussed w/ the patient...   Assessment:      HBP> started on LOSARTAN100 11/15 and BP improved on Rx; not checking BP at home & reminded to do so; f/u BP 2017 as high has 200/100 and meds added=> Bystolic10 & Amlod5 w/ good control... 01/2016>  Further eval w/ CTA abd revealed bilat renal fibromusc hyperplasia & pt referred to [facility] specialist... 02/11/16> she had  bilat renal arteriography confirming the presence of bilat fibromuscular dysplasia BUT no signif RA stenoses... BP remains well controlled on her 3 med regimen... 05/26/16> BP remains controlled on her 3 med regimen...   VV, VI>  Prev eval at the Vein clinic w/ surg by DrFeatherston...  CHOL>  On diet alone & FLP has not been at goal- she will ret for FLP soon...  GI> GERD, Polyps>  She uses OTC acid suppressors & had f/u colonoscopy 11/12 (neg)...  Hx HAs>  She denies recent HAs etc...  Vit D Defic>  She was not taking VitD supplements as requested; labs 1/16 showed Vit D level = 17 & she s rec to start VitD 5000u daily but she  reverted to OTC 1000u supplement!  MENINGIOMA>>  7mm right temp lobe lesion eval by Neuro & NS 11/2015 Hosp... They are following.  Anxiety>  Prev used Lorazepam as needed & much better since she's retired...     Plan:     Patient's Medications  New Prescriptions   No medications on file  Previous Medications   AMLODIPINE (NORVASC) 5 MG TABLET    Take 1 tablet (5 mg total) by mouth daily.   ASPIRIN EC 81 MG TABLET    Take 81 mg by mouth daily.   CHOLECALCIFEROL (VITAMIN D) 1000 UNITS TABLET    Take 1,000 Units by mouth daily.   ESTROGENS, CONJUGATED, (PREMARIN) 0.3 MG TABLET    Take 0.3 mg by mouth See admin instructions. Take daily for 21 days then do not take for 7 days.   LOSARTAN (COZAAR) 100 MG TABLET    TAKE 1 TABLET (100 MG TOTAL) BY MOUTH DAILY.   NEBIVOLOL (BYSTOLIC) 10 MG TABLET    Take 1 tablet (10 mg total) by mouth daily.  Modified Medications   No medications on file  Discontinued Medications   No medications on file

## 2016-06-09 ENCOUNTER — Other Ambulatory Visit: Payer: Self-pay | Admitting: Neurological Surgery

## 2016-06-09 DIAGNOSIS — D329 Benign neoplasm of meninges, unspecified: Secondary | ICD-10-CM

## 2016-06-24 ENCOUNTER — Ambulatory Visit
Admission: RE | Admit: 2016-06-24 | Discharge: 2016-06-24 | Disposition: A | Discharge status: Home / Self Care | Payer: Federal, State, Local not specified - PPO | Source: Ambulatory Visit | Attending: Neurological Surgery | Admitting: Neurological Surgery

## 2016-06-24 DIAGNOSIS — D329 Benign neoplasm of meninges, unspecified: Secondary | ICD-10-CM

## 2016-06-24 MED ORDER — GADOBENATE DIMEGLUMINE 529 MG/ML IV SOLN
15.0000 mL | Freq: Once | INTRAVENOUS | Status: AC | PRN
  Administered 2016-06-24: 12 mL via INTRAVENOUS

## 2016-11-08 ENCOUNTER — Other Ambulatory Visit: Payer: Self-pay | Admitting: Pulmonary Disease

## 2016-12-19 ENCOUNTER — Other Ambulatory Visit: Payer: Self-pay | Admitting: Pulmonary Disease

## 2016-12-20 ENCOUNTER — Other Ambulatory Visit: Payer: Self-pay

## 2016-12-20 MED ORDER — NEBIVOLOL HCL 10 MG PO TABS: 10.0000 mg | ORAL_TABLET | Freq: Every day | ORAL | 2 refills | Status: DC

## 2017-01-02 ENCOUNTER — Ambulatory Visit (INDEPENDENT_AMBULATORY_CARE_PROVIDER_SITE_OTHER): Payer: Federal, State, Local not specified - PPO | Admitting: Pulmonary Disease

## 2017-01-02 ENCOUNTER — Encounter: Payer: Self-pay | Admitting: Pulmonary Disease

## 2017-01-02 VITALS — BP 108/62 | HR 66 | Temp 97.2°F | Ht 62.0 in | Wt 133.2 lb

## 2017-01-02 DIAGNOSIS — I8390 Asymptomatic varicose veins of unspecified lower extremity: Secondary | ICD-10-CM

## 2017-01-02 DIAGNOSIS — D126 Benign neoplasm of colon, unspecified: Secondary | ICD-10-CM

## 2017-01-02 DIAGNOSIS — D329 Benign neoplasm of meninges, unspecified: Secondary | ICD-10-CM

## 2017-01-02 DIAGNOSIS — M25612 Stiffness of left shoulder, not elsewhere classified: Secondary | ICD-10-CM

## 2017-01-02 DIAGNOSIS — I773 Arterial fibromuscular dysplasia: Secondary | ICD-10-CM

## 2017-01-02 DIAGNOSIS — E7800 Pure hypercholesterolemia, unspecified: Secondary | ICD-10-CM

## 2017-01-02 DIAGNOSIS — L501 Idiopathic urticaria: Secondary | ICD-10-CM

## 2017-01-02 DIAGNOSIS — K219 Gastro-esophageal reflux disease without esophagitis: Secondary | ICD-10-CM

## 2017-01-02 DIAGNOSIS — I10 Essential (primary) hypertension: Secondary | ICD-10-CM

## 2017-01-02 DIAGNOSIS — E559 Vitamin D deficiency, unspecified: Secondary | ICD-10-CM

## 2017-01-02 DIAGNOSIS — F411 Generalized anxiety disorder: Secondary | ICD-10-CM

## 2017-01-02 MED ORDER — LORAZEPAM 1 MG PO TABS: 1.0000 mg | ORAL_TABLET | Freq: Every day | ORAL | 5 refills | Status: DC

## 2017-01-02 MED ORDER — LOSARTAN POTASSIUM 100 MG PO TABS: 100.0000 mg | ORAL_TABLET | Freq: Every day | ORAL | 3 refills | Status: DC

## 2017-01-02 MED ORDER — AMLODIPINE BESYLATE 5 MG PO TABS: 5.0000 mg | ORAL_TABLET | Freq: Every day | ORAL | 3 refills | Status: DC

## 2017-01-02 MED ORDER — NEBIVOLOL HCL 10 MG PO TABS: 10.0000 mg | ORAL_TABLET | Freq: Every day | ORAL | 3 refills | Status: DC

## 2017-01-02 NOTE — Patient Instructions (Signed)
Today we updated your med list in our EPIC system...    Continue your current medications the same...    We refilled your meds per request...  At your convenience>    Please return to our lab one morning FASTING for your follow up blood work...  We will refer you to an Orthopedist to evaluate your left shoulder problem...  Call for any questions...  Let's plan a follow up visit in 62mo, sooner if needed for problems.Marland KitchenMarland Kitchen

## 2017-01-02 NOTE — Progress Notes (Signed)
Subjective:     Patient ID: [name] [name], female   DOB: 07/02/1951, 66 y.o.   MRN: [id number]  HPI 66 y/o BF here for a follow up visit... she has mult med problems as noted....  ~  SEE PREV EPIC NOTES FOR OLDER DATA >>    LABS 4/14:  Chems- wnl w/ K=3.6;  CBC- wnl;  TSH=1.90;  VitD=32 & rec to take 1000u OTC supplement daily...  CXR 11/15 showed norm heart size, clear lungs, NAD.Marland KitchenMarland Kitchen  EKG 11/15 showed NSR, rate71, wnl, NAD...  ~  September 29, 2014:  39mo ROV & Lekisha is here for a follow up on her BP- now back on meds= Cozaar100 + low salt diet etc;  She notes that she has not yet taken today's dose & BP here is 142/90; she has not been checking her BP at home; med tolerated well & she is feeling well w/o CP/ palpit/ SOB/ edema/ etc... She did her FASTING blood work today (see below)...    CHOL> on diet alone; FLP 1/16 showed TChol 237, TG 71, HDL 78, LDL 145; this is the worst she has had & rec to start Simva40- take 1/2 tab daily...    Vit D Defic> Labs 1/16 showed Vit D level = 17 & rec to start OTC VitD supplement ~5000u daily... We reviewed prob list, meds, xrays and labs> see below for updates >>   LABS 1/16:  FLP- not at goals on diet alone w/ LDL=145;  Chems- ok x K=3.3 & rec dietary supplement;  CBC- wnl;  TSH=1.78;  VitD=17...  ~  March 30, 2015:  69mo ROV & Teres reports doing well, BP controlled on her regular Cozaar100 (tol well) w/ BP=134/70 today & she denies HA, visual sx, CP, palpit, SOB, edema, etc... She decided NOT to restart the Simva20 & wants to control her Chol w/ diet + exercise (we reviewed these today...  Finally she has continued the VitD supplement 5000u daily... EXAM shows Afeb, VSS, O2sat=99% on RA;  HEENT- neg, mallampati1;  Chest- clear w/o w/r/r;  Heart- RR w/o m/r/g;  Abd- neg- soft, nondender;  Ext- w/o c/c/e;  Neuro- intact... We reviewed prob list, meds, xrays and labs>  PLAN>>  Naylani is stable on her diet + Cozaar100, ASA81, VitD5000; we reviewed diet &  exercise program...   ~  September 30, 2015:  69mo ROV & annual CPX>  Arianny reports doing well, feels good, no new complaints or concerns;  She remains active walking her [place] Husky "[name]" 106mi/d;  We reviewed the following medical problems during today's office visit >>     HBP> on ASA81 & LOSAR100; BP= 138/64 & she denies CP, palpit, dizzy, SOB, edema, etc; continue same & low sodium...    VV> prev eval at vein clinic DrFeatherston w/ surg; she knows to elim sodium, elev legs, wear support hose; denies pain, swelling, leg lesions...    CHOL> on diet alone; last FLP 1/16 showed TChol 237, TG 71, HDL 78, LDL 145; we reviewed low chol/ low fat diet...    GI- GERD, Polyps> on OTC Prilosec vs Zantac prn; f/u colon 11/12 by DrPatterson was neg & he rec f/u 25yrs...    GYN> on Premarin0.3 and BlackCohosh per Andree Coss (seen yearly); she is s/p hyst; mammograms neg at [facility] annually (last 10/16)...    VitD defic> VitD level 1/16 = 17 & asked to incr OTC supplement to 5000u daily but she didn't do it.Marland KitchenMarland Kitchen  Subjective:     Patient ID: [name] [name], female   DOB: 03-08-1951, 66 y.o.   MRN: [id number]  HPI 66 y/o BF here for a follow up visit... she has mult med problems as noted....  ~  SEE PREV EPIC NOTES FOR OLDER DATA >>    LABS 4/14:  Chems- wnl w/ K=3.6;  CBC- wnl;  TSH=1.90;  VitD=32 & rec to take 1000u OTC supplement daily...  CXR 11/15 showed norm heart size, clear lungs, NAD.Marland KitchenMarland Kitchen  EKG 11/15 showed NSR, rate71, wnl, NAD...  ~  September 29, 2014:  77moROV & DTiajahis here for a follow up on her BP- now back on meds= Cozaar100 + low salt diet etc;  She notes that she has not yet taken today's dose & BP here is 142/90; she has not been checking her BP at home; med tolerated well & she is feeling well w/o CP/ palpit/ SOB/ edema/ etc... She did her FASTING blood work today (see below)...    CHOL> on diet alone; FLP 1/16 showed TChol 237, TG 71, HDL 78, LDL 145; this is the worst she has had & rec to start Simva40- take 1/2 tab daily...    Vit D Defic> Labs 1/16 showed Vit D level = 17 & rec to start OTC VitD supplement ~5000u daily... We reviewed prob list, meds, xrays and labs> see below for updates >>   LABS 1/16:  FLP- not at goals on diet alone w/ LDL=145;  Chems- ok x K=3.3 & rec dietary supplement;  CBC- wnl;  TSH=1.78;  VitD=17...  ~  March 30, 2015:  631moOV & Izora reports doing well, BP controlled on her regular Cozaar100 (tol well) w/ BP=134/70 today & she denies HA, visual sx, CP, palpit, SOB, edema, etc... She decided NOT to restart the Simva20 & wants to control her Chol w/ diet + exercise (we reviewed these today...  Finally she has continued the VitD supplement 5000u daily... EXAM shows Afeb, VSS, O2sat=99% on RA;  HEENT- neg, mallampati1;  Chest- clear w/o w/r/r;  Heart- RR w/o m/r/g;  Abd- neg- soft, nondender;  Ext- w/o c/c/e;  Neuro- intact... We reviewed prob list, meds, xrays and labs>  PLAN>>  DiMillies stable on her diet + Cozaar100, ASA81, VitD5000; we reviewed diet &  exercise program...   ~  September 30, 2015:  72m4moV & annual CPX>  DiaQuintessaports doing well, feels good, no new complaints or concerns;  She remains active walking her SibSolenidCarmel Sacramentomi85m  We reviewed the following medical problems during today's office visit >>     HBP> on ASA81 & LOSAR100; BP= 138/64 & she denies CP, palpit, dizzy, SOB, edema, etc; continue same & low sodium...    VV> prev eval at vein clinic DrFeatherston w/ surg; she knows to elim sodium, elev legs, wear support hose; denies pain, swelling, leg lesions...    CHOL> on diet alone; last FLP 1/16 showed TChol 237, TG 71, HDL 78, LDL 145; we reviewed low chol/ low fat diet...    GI- GERD, Polyps> on OTC Prilosec vs Zantac prn; f/u colon 11/12 by DrPatterson was neg & he rec f/u 78yr21yr   GYN> on Premarin0.3 and BlackCohosh per Gyn DChipper Herbn yearly); she is s/p hyst; [facility] (last 10/16)...    VitD defic> VitD level 1/16 = 17 & asked to incr OTC supplement to 5000u daily but she didn't do it...Marland KitchenMarland Kitchen  Subjective:     Patient ID: [name] [name], female   DOB: 03-08-1951, 66 y.o.   MRN: [id number]  HPI 66 y/o BF here for a follow up visit... she has mult med problems as noted....  ~  SEE PREV EPIC NOTES FOR OLDER DATA >>    LABS 4/14:  Chems- wnl w/ K=3.6;  CBC- wnl;  TSH=1.90;  VitD=32 & rec to take 1000u OTC supplement daily...  CXR 11/15 showed norm heart size, clear lungs, NAD.Marland KitchenMarland Kitchen  EKG 11/15 showed NSR, rate71, wnl, NAD...  ~  September 29, 2014:  77moROV & DTiajahis here for a follow up on her BP- now back on meds= Cozaar100 + low salt diet etc;  She notes that she has not yet taken today's dose & BP here is 142/90; she has not been checking her BP at home; med tolerated well & she is feeling well w/o CP/ palpit/ SOB/ edema/ etc... She did her FASTING blood work today (see below)...    CHOL> on diet alone; FLP 1/16 showed TChol 237, TG 71, HDL 78, LDL 145; this is the worst she has had & rec to start Simva40- take 1/2 tab daily...    Vit D Defic> Labs 1/16 showed Vit D level = 17 & rec to start OTC VitD supplement ~5000u daily... We reviewed prob list, meds, xrays and labs> see below for updates >>   LABS 1/16:  FLP- not at goals on diet alone w/ LDL=145;  Chems- ok x K=3.3 & rec dietary supplement;  CBC- wnl;  TSH=1.78;  VitD=17...  ~  March 30, 2015:  631moOV & Izora reports doing well, BP controlled on her regular Cozaar100 (tol well) w/ BP=134/70 today & she denies HA, visual sx, CP, palpit, SOB, edema, etc... She decided NOT to restart the Simva20 & wants to control her Chol w/ diet + exercise (we reviewed these today...  Finally she has continued the VitD supplement 5000u daily... EXAM shows Afeb, VSS, O2sat=99% on RA;  HEENT- neg, mallampati1;  Chest- clear w/o w/r/r;  Heart- RR w/o m/r/g;  Abd- neg- soft, nondender;  Ext- w/o c/c/e;  Neuro- intact... We reviewed prob list, meds, xrays and labs>  PLAN>>  DiMillies stable on her diet + Cozaar100, ASA81, VitD5000; we reviewed diet &  exercise program...   ~  September 30, 2015:  72m4moV & annual CPX>  DiaQuintessaports doing well, feels good, no new complaints or concerns;  She remains active walking her SibSolenidCarmel Sacramentomi85m  We reviewed the following medical problems during today's office visit >>     HBP> on ASA81 & LOSAR100; BP= 138/64 & she denies CP, palpit, dizzy, SOB, edema, etc; continue same & low sodium...    VV> prev eval at vein clinic DrFeatherston w/ surg; she knows to elim sodium, elev legs, wear support hose; denies pain, swelling, leg lesions...    CHOL> on diet alone; last FLP 1/16 showed TChol 237, TG 71, HDL 78, LDL 145; we reviewed low chol/ low fat diet...    GI- GERD, Polyps> on OTC Prilosec vs Zantac prn; f/u colon 11/12 by DrPatterson was neg & he rec f/u 78yr21yr   GYN> on Premarin0.3 and BlackCohosh per Gyn DChipper Herbn yearly); she is s/p hyst; [facility] (last 10/16)...    VitD defic> VitD level 1/16 = 17 & asked to incr OTC supplement to 5000u daily but she didn't do it...Marland KitchenMarland Kitchen  work...  We reviewed the following medical problems during today's office visit>      HBP> on ASA81, Amlod5, Bystolic10, Losar100; BP= 108/62 & she denies CP, palpit, dizzy, SOB, edema, etc; continue same & low sodium diet; Vasc Surg eval 01/2016 w/ bilat fibromusc hyperplasia of renal arteries  but w/o hemodynamic stenoses...    VV> prev eval at vein clinic DrFeatherston w/ surg; she knows to elim sodium, elev legs, wear support hose; denies pain, swelling, leg lesions...    CHOL> on diet alone; last FLP 1/16 showed TChol 237, TG 71, HDL 78, LDL 145; not at goals but she does not want meds; Repeat FLP 01/17/17 shows TChol 247, TG 79, HDL 76, LDL 155 (we discussed that low dose statin would be helpful, offered alternative Chol meds, she refuses all interventions and will continue diet + exercise...     GI- GERD, Polyps> on OTC Prilosec vs Zantac prn; f/u colon 11/12 by DrPatterson was neg & he rec f/u 24yrs...    GYN> on Premarin0.3 and BlackCohosh per Andree Coss (seen yearly); she is s/p hyst; mammograms neg at [facility] annually     Meningioma>  MRI Brain 11/2015 showed a sm 7mm T2-enhancing lesion in right temporal lobe cortex c/w incidental meningioma, no acute infarct, tiny chr left cbll infarct; she has seen DrPenumali for Neuro, and DrDitty for NS; she stopped Keppra, prev hx dizziness has resolved...      VitD defic> VitD level 1/16 = 17 & asked to incr OTC supplement;  F/u labs 5/18 showed VitD = 57 & rec to continue supplementation    Anxiety> prev on Ativan 0.5mg  prn; stress w/ care of mother- [name] (pancreatic Ca w/ hepatic mets) => mother & father passed within 1 week of each other...    Hx idiopathic urticaria> she believed this was due to wt lifting in the past; no recurrence... EXAM shows Afeb, BP=108/62, O2sat=98% on RA;  HEENT- neg, mallampati2;  Chest- clear w/o w/r/r;  Heart- RR w/o m/r/g;  Abd- soft, +faint abd bruit heard;  Ext- neg w/o c/c/e;  Neuro- intact...  LABS 01/17/17>  FLP- not at goals on diet alone w/ TChol=247, LDL=155 (Pt refuses statin rx);  Chems- wnl w/ K=3.5, BS=84, Cr=1.00, LFTs wnl;  CBC- wnl w/ Hg=13.7;  TSH=2.25;  VitD=57;  HepC NEG IMP/PLAN>>  We will refer to Ortho to eval the left arm/ shoulder pain & decr ROM;  Fasting labs ret w/ elev TChol/ LDL  but she declines meds, prefers diet + exercise despite her lack of response;  BP controlled, no neurologic symptoms, anxiety is on the wane...           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  ~  10/11:  BP= 126/78, tolerating rx well... denies HA, fatigue, visual changes, CP, palipit, dizziness, syncope, dyspnea, edema, etc... ~  1/13:  BP= 132/80 & her fatigue is resolved, feeling better, currently asymptomatic... ~  4/14: on ASA81, Avapro150, HCT12.5, K20; BP= 138/78 & she denies CP, palpit, dizzy, SOB, edema, etc. ~  11/15: on ASA81 & ran out of Avapro150/ HCT12.5/ K20- all ~18mo ago; BP= 180/90 & she denies CP, palpit, dizzy, SOB, edema, etc; we decided to change to LOSARTAN100mg /d, low sodium, etc. ~  1/16: on ASA81 & Cozaar100; BP= 142/90 but hasn't taken meds  today; remains asymptomatic; reminded to take med daily in AM... ~  7/16: BP controlled on her regular Cozaar100 (tol well) w/ BP=134/70 today & she denies HA, visual sx, CP, palpit, SOB, edema, etc... ~  1/17: on ASA81 & LOSAR100; BP= 138/64 & she denies CP, palpit, dizzy, SOB, edema, etc; continue same & low sodium. ~  01/2016> she was [facility] briefly w/ dizziness & weakness, MRI showed a 7mm meningioma in the right temporal lobe, placed on Keppra but subseq EEGs were neg, BP meds adjusted w/ Bystolic10 & Amlod5 added to her Losar100; Exam revealed a faint abd bruit=> CTA Abd showed Bilat renal art fibromuscular hyperplasia beading changes... refewrred to [facility] Endovasc specialist for further eval. ~  02/11/16> she had bilat renal arteriography confirming the presence of bilat fibromuscular dysplasia BUT no signif RA stenoses... ~  BP appears to be well controlled on Bystolic10, Amlodipine5, Losartan100...  VARICOSE VEINS, LOWER EXTREMITIES (ICD-454.9) - she has  been eval at the Vein clinic and had [facility] surg by DrFeatherston (sclerotherapy w/o help she says)... she avoids sodium, elevates legs, wears support hose...  HYPERCHOLESTEROLEMIA (ICD-272.0) - on diet alone and has a high HDL... ~  last FLP was 6/05 showing TChol 236, Tg 148, HDL 83, LDL 125... I told her she may need a low dose statin med to get her TChol & LDL to goal... ~  FLP 10/10 showed TChol 222, TG 63, HDL 92, LDL 118... rec better diet effort. ~  FLP 1/13 on diet alone showed  TChol 205, TG 27, HDL 92, LDL 98 ~  FLP 1/16 on diet alone showed TChol 237, TG 71, HDL 78, LDL 145... rec to start [facility]- 1/2 tab daily (she chose not to & prefers diet alone)... ~  FLP 1/17 on diet alone - TChol 237, TG 71, HDL 78, LDL 145 and she declines to consider med rx, prefers diet alone despite these numbers...  GERD (ICD-530.81) - occas reflux symptoms treated w/ Prilosec vs Zantac OTC...  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 follow up planned 54yrs & we will refer to GI for this important screening procedure. ~  F/u colonoscopy done 11/12 by DrPatterson was neg> no polyps or cancers, no rectal mass, f/u suggested in 48yrs...  HEADACHE (ICD-784.0) - she believes related to stress...  VITAMIN D DEFICIENCY >> ~  Labs 4/14 showed Vit D level = 32 and she is rec to start OTC Vit D supplement ~1000u daily (she didn't do it). ~  Labs 1/16 showed Vit D level = 17 and she is rec to start OTC Vit D supplement = 5000u daily...  MENINGIOMA >> MRI Brain 11/2015 showed a sm 7mm T2enhancing lesion in right temporal lobe cortex c/w meningioma, no acute infarct, tiny chr left cbll infarct;  Neuro was consulted & they wondered if the dizziness may be an epileptiform seizure & started Keppra rx=> EEG was NEG; Neuro wanted [facility] to do a ?metastatic? work-up- this was NEG as well (see below); Disch on [id number] for  Neuro follow up & DrPenumalli felt the meningioma was prob incidental, pt indcatres that she will be followed by NS, DrDitty...  ANXIETY (ICD-300.00) - she uses LORAZEPAM 0.5mg  as needed for anxiety... eg. during storms etc...  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  Health Maintenance:  Also takes ASA 81mg /d... GYN= DrHorvath,  on Premarin 0.3mg /d, s/p hysterectomy... Mammograms yearly at [facility] (she has implants)- and they follow closely... she gets the seasonal Flu vaccine yearly... given TDAP booster 10/11...   Past Surgical History:  Procedure Laterality Date  . benign breast biopsy    . bilateral breast implants    . cataract right eye w/ lens implant    . COLONOSCOPY    . excision of large anal polyp    . PERIPHERAL VASCULAR CATHETERIZATION N/A 02/11/2016   Procedure: Renal Angiography;  Surgeon: Fransisco Hertz, MD;  Location: [facility] INVASIVE CV LAB;  Service: Cardiovascular;  Laterality: N/A;  . POLYPECTOMY    . VAGINAL HYSTERECTOMY    . varicose vein      Outpatient Encounter Prescriptions as of 01/02/2017  Medication Sig  . amLODipine (NORVASC) 5 MG tablet Take 1 tablet (5 mg total) by mouth daily.  Marland Kitchen aspirin EC 81 MG tablet Take 81 mg by mouth daily.  . cholecalciferol (VITAMIN D) 1000 units tablet Take 1,000 Units by mouth daily.  Marland Kitchen estrogens, conjugated, (PREMARIN) 0.3 MG tablet Take 0.3 mg by mouth See admin instructions. Take daily for 21 days then do not take for 7 days.  Marland Kitchen losartan (COZAAR) 100 MG tablet Take 1 tablet (100 mg total) by mouth daily.  . nebivolol (BYSTOLIC) 10 MG tablet Take 1 tablet (10 mg total) by mouth daily.  . [DISCONTINUED] amLODipine (NORVASC) 5 MG tablet Take 1 tablet (5 mg total) by mouth daily.  . [DISCONTINUED] losartan (COZAAR) 100 MG tablet TAKE 1 TABLET BY MOUTH DAILY  . [DISCONTINUED] nebivolol (BYSTOLIC) 10 MG tablet Take 1 tablet (10 mg total) by mouth daily.  Marland Kitchen LORazepam  (ATIVAN) 1 MG tablet Take 1 tablet (1 mg total) by mouth at bedtime.   No facility-administered encounter medications on file as of 01/02/2017.     No Known Allergies    Immunization History  Administered Date(s) Administered  . Influenza Split 06/13/2011, 07/19/2012  . Influenza Whole 07/14/2009, 07/14/2010  . Influenza,inj,Quad PF,36+ Mos 08/13/2013, 07/28/2014, 08/25/2015, 05/26/2016  . Td 07/14/2010    Current Medications, Allergies, Past Medical History, Past Surgical History, Family History, and Social History were reviewed in [employer] record.    Review of Systems       See HPI - all other systems neg except as noted...  The patient denies anorexia, fever, weight loss, weight gain, vision loss, decreased hearing, hoarseness, chest pain, syncope, dyspnea on exertion, peripheral edema, prolonged cough, headaches, hemoptysis, abdominal pain, melena, hematochezia, severe indigestion/heartburn, hematuria, incontinence, muscle weakness, suspicious skin lesions, transient blindness, difficulty walking, depression, unusual weight change, abnormal bleeding, enlarged lymph nodes, and angioedema.    Objective:   Physical Exam     WD, WN, 65 y/o BF in NAD... GENERAL:  Alert & oriented; pleasant & cooperative... HEENT:  [place]/AT, EOM-wnl, PERRLA, EACs-clear, TMs-wnl, NOSE-clear, THROAT-clear & wnl. NECK:  Supple w/ full ROM; no JVD; normal carotid impulses w/o bruits; no thyromegaly or nodules palpated; no lymphadenopathy. CHEST:  Clear to P & A; without wheezes/ rales/ or rhonchi. HEART:  Regular Rhythm; without murmurs/ rubs/ or gallops. ABDOMEN:  Soft & nontender; normal bowel sounds & faint abd bruit heard; no organomegaly or masses detected. EXT: without deformities or arthritic changes; no varicose veins/ venous insuffic/ or edema. NEURO:  CN's intact; motor testing normal; sensory testing normal; gait normal & balance OK. DERM:  No lesions noted; no rash  etc...  RADIOLOGY DATA:  Reviewed in the EPIC EMR &  Subjective:     Patient ID: [name] [name], female   DOB: 03-08-1951, 66 y.o.   MRN: [id number]  HPI 66 y/o BF here for a follow up visit... she has mult med problems as noted....  ~  SEE PREV EPIC NOTES FOR OLDER DATA >>    LABS 4/14:  Chems- wnl w/ K=3.6;  CBC- wnl;  TSH=1.90;  VitD=32 & rec to take 1000u OTC supplement daily...  CXR 11/15 showed norm heart size, clear lungs, NAD.Marland KitchenMarland Kitchen  EKG 11/15 showed NSR, rate71, wnl, NAD...  ~  September 29, 2014:  77moROV & DTiajahis here for a follow up on her BP- now back on meds= Cozaar100 + low salt diet etc;  She notes that she has not yet taken today's dose & BP here is 142/90; she has not been checking her BP at home; med tolerated well & she is feeling well w/o CP/ palpit/ SOB/ edema/ etc... She did her FASTING blood work today (see below)...    CHOL> on diet alone; FLP 1/16 showed TChol 237, TG 71, HDL 78, LDL 145; this is the worst she has had & rec to start Simva40- take 1/2 tab daily...    Vit D Defic> Labs 1/16 showed Vit D level = 17 & rec to start OTC VitD supplement ~5000u daily... We reviewed prob list, meds, xrays and labs> see below for updates >>   LABS 1/16:  FLP- not at goals on diet alone w/ LDL=145;  Chems- ok x K=3.3 & rec dietary supplement;  CBC- wnl;  TSH=1.78;  VitD=17...  ~  March 30, 2015:  631moOV & Izora reports doing well, BP controlled on her regular Cozaar100 (tol well) w/ BP=134/70 today & she denies HA, visual sx, CP, palpit, SOB, edema, etc... She decided NOT to restart the Simva20 & wants to control her Chol w/ diet + exercise (we reviewed these today...  Finally she has continued the VitD supplement 5000u daily... EXAM shows Afeb, VSS, O2sat=99% on RA;  HEENT- neg, mallampati1;  Chest- clear w/o w/r/r;  Heart- RR w/o m/r/g;  Abd- neg- soft, nondender;  Ext- w/o c/c/e;  Neuro- intact... We reviewed prob list, meds, xrays and labs>  PLAN>>  DiMillies stable on her diet + Cozaar100, ASA81, VitD5000; we reviewed diet &  exercise program...   ~  September 30, 2015:  72m4moV & annual CPX>  DiaQuintessaports doing well, feels good, no new complaints or concerns;  She remains active walking her SibSolenidCarmel Sacramentomi85m  We reviewed the following medical problems during today's office visit >>     HBP> on ASA81 & LOSAR100; BP= 138/64 & she denies CP, palpit, dizzy, SOB, edema, etc; continue same & low sodium...    VV> prev eval at vein clinic DrFeatherston w/ surg; she knows to elim sodium, elev legs, wear support hose; denies pain, swelling, leg lesions...    CHOL> on diet alone; last FLP 1/16 showed TChol 237, TG 71, HDL 78, LDL 145; we reviewed low chol/ low fat diet...    GI- GERD, Polyps> on OTC Prilosec vs Zantac prn; f/u colon 11/12 by DrPatterson was neg & he rec f/u 78yr21yr   GYN> on Premarin0.3 and BlackCohosh per Gyn DChipper Herbn yearly); she is s/p hyst; [facility] (last 10/16)...    VitD defic> VitD level 1/16 = 17 & asked to incr OTC supplement to 5000u daily but she didn't do it...Marland KitchenMarland Kitchen

## 2017-01-17 ENCOUNTER — Other Ambulatory Visit (INDEPENDENT_AMBULATORY_CARE_PROVIDER_SITE_OTHER): Payer: Federal, State, Local not specified - PPO

## 2017-01-17 ENCOUNTER — Other Ambulatory Visit: Payer: Self-pay | Admitting: Pulmonary Disease

## 2017-01-17 DIAGNOSIS — E7800 Pure hypercholesterolemia, unspecified: Secondary | ICD-10-CM

## 2017-01-17 DIAGNOSIS — I10 Essential (primary) hypertension: Secondary | ICD-10-CM | POA: Diagnosis not present

## 2017-01-17 DIAGNOSIS — I773 Arterial fibromuscular dysplasia: Secondary | ICD-10-CM

## 2017-01-17 DIAGNOSIS — E559 Vitamin D deficiency, unspecified: Secondary | ICD-10-CM | POA: Diagnosis not present

## 2017-01-17 DIAGNOSIS — F411 Generalized anxiety disorder: Secondary | ICD-10-CM

## 2017-01-17 LAB — CBC WITH DIFFERENTIAL/PLATELET
Basophils Absolute: 0 10*3/uL (ref 0.0–0.1)
Basophils Relative: 0.5 % (ref 0.0–3.0)
EOS PCT: 1.7 % (ref 0.0–5.0)
Eosinophils Absolute: 0.1 10*3/uL (ref 0.0–0.7)
HCT: 40.8 % (ref 36.0–46.0)
HEMOGLOBIN: 13.7 g/dL (ref 12.0–15.0)
LYMPHS ABS: 1.5 10*3/uL (ref 0.7–4.0)
Lymphocytes Relative: 31.4 % (ref 12.0–46.0)
MCHC: 33.6 g/dL (ref 30.0–36.0)
MCV: 88.1 fl (ref 78.0–100.0)
MONO ABS: 0.4 10*3/uL (ref 0.1–1.0)
Monocytes Relative: 8.5 % (ref 3.0–12.0)
NEUTROS PCT: 57.9 % (ref 43.0–77.0)
Neutro Abs: 2.8 10*3/uL (ref 1.4–7.7)
Platelets: 332 10*3/uL (ref 150.0–400.0)
RBC: 4.63 Mil/uL (ref 3.87–5.11)
RDW: 13.6 % (ref 11.5–15.5)
WBC: 4.9 10*3/uL (ref 4.0–10.5)

## 2017-01-17 LAB — COMPREHENSIVE METABOLIC PANEL
ALBUMIN: 4.4 g/dL (ref 3.5–5.2)
ALT: 18 U/L (ref 0–35)
AST: 24 U/L (ref 0–37)
Alkaline Phosphatase: 86 U/L (ref 39–117)
BUN: 12 mg/dL (ref 6–23)
CALCIUM: 9.7 mg/dL (ref 8.4–10.5)
CHLORIDE: 103 meq/L (ref 96–112)
CO2: 32 mEq/L (ref 19–32)
Creatinine, Ser: 1 mg/dL (ref 0.40–1.20)
GFR: 71.82 mL/min (ref >60.00)
Glucose, Bld: 84 mg/dL (ref 70–99)
POTASSIUM: 3.5 meq/L (ref 3.5–5.1)
SODIUM: 142 meq/L (ref 135–145)
Total Bilirubin: 0.4 mg/dL (ref 0.2–1.2)
Total Protein: 7.8 g/dL (ref 6.0–8.3)

## 2017-01-17 LAB — LIPID PANEL
CHOLESTEROL: 247 mg/dL — AB (ref 0–200)
HDL: 76.4 mg/dL (ref >39.00)
LDL CALC: 155 mg/dL — AB (ref 0–99)
NonHDL: 170.33
Total CHOL/HDL Ratio: 3
Triglycerides: 79 mg/dL (ref 0.0–149.0)
VLDL: 15.8 mg/dL (ref 0.0–40.0)

## 2017-01-17 LAB — VITAMIN D 25 HYDROXY (VIT D DEFICIENCY, FRACTURES): VITD: 57.49 ng/mL (ref 30.00–100.00)

## 2017-01-17 LAB — TSH: TSH: 2.25 u[IU]/mL (ref 0.35–4.50)

## 2017-01-17 LAB — HEPATITIS C ANTIBODY: HCV AB: NEGATIVE

## 2017-01-18 ENCOUNTER — Other Ambulatory Visit: Payer: Self-pay | Admitting: Pulmonary Disease

## 2017-01-18 MED ORDER — ROSUVASTATIN CALCIUM 10 MG PO TABS: 10.0000 mg | ORAL_TABLET | Freq: Every day | ORAL | 5 refills | Status: DC

## 2017-01-19 ENCOUNTER — Telehealth: Payer: Self-pay | Admitting: Pulmonary Disease

## 2017-01-19 NOTE — Telephone Encounter (Signed)
Pt states she needs a letter from [facility] stating he is agreeable to have patient start Hormone therapy again. Since she had issues with her veins in the past.    SN please advise on letter. Thanks.

## 2017-01-24 NOTE — Telephone Encounter (Signed)
SN please advise on letter.  Thanks!

## 2017-01-25 NOTE — Telephone Encounter (Signed)
Letter typed and faxed.  Pt aware.  Nothing further needed.

## 2017-01-25 NOTE — Telephone Encounter (Signed)
Per SN---  Ok to start hormone therapy.  She will need careful monitoring of her leg for signs of DVT.  thanks

## 2017-02-07 ENCOUNTER — Other Ambulatory Visit: Payer: Self-pay | Admitting: Pulmonary Disease

## 2017-03-26 IMAGING — MR MR HEAD W/O CM
9 of 12 series · 33 of 48 positions shown · non-contrast
Comparison: Head CT 12/15/2015 and MRI 12/10/2005

CLINICAL DATA: Sudden onset severe dizziness with vomiting. Near
syncope. Hypertensive.

EXAM:
MRI HEAD WITHOUT CONTRAST
MRA HEAD WITHOUT CONTRAST
TECHNIQUE: Multiplanar, multiecho pulse sequences of the brain and surrounding
structures were obtained without intravenous contrast. Angiographic
images of the head were obtained using MRA technique without
contrast.

[Series 3: T1 · sagittal · 5.0mm · 0.47mm/px · 1 of 23 slices shown]
[im 1/23]
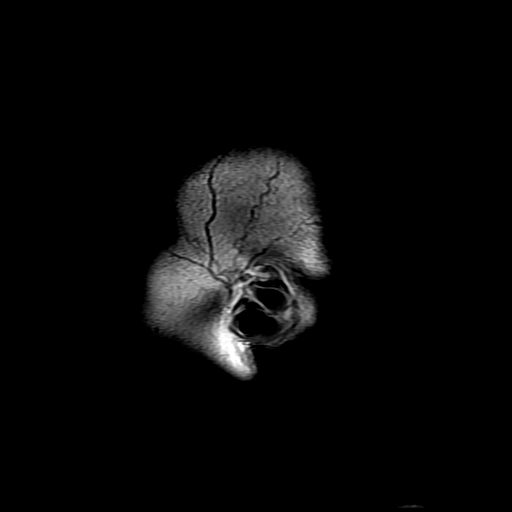

[Series 4: DWI · axial · 3.0mm · 1.09mm/px · z∈[-62,+82]mm · 7 of 98 slices shown (1 of 4)]
[im 1/98]
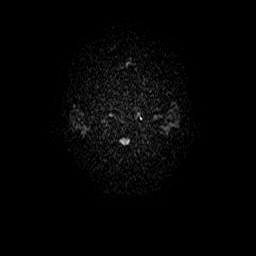
[im 17/98]
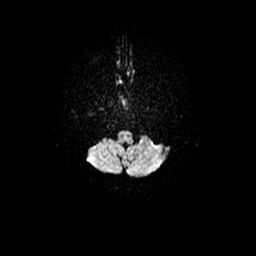
[im 33/98]
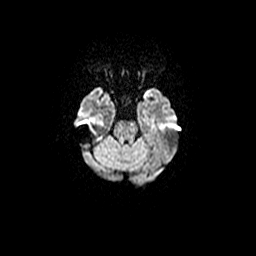
[im 49/98]
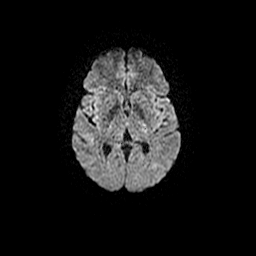
[im 65/98]
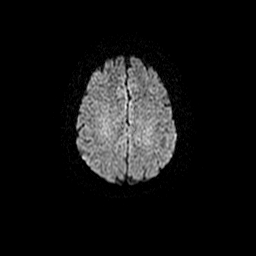
[im 81/98]
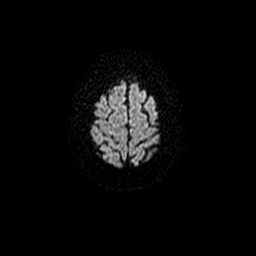
[im 98/98]
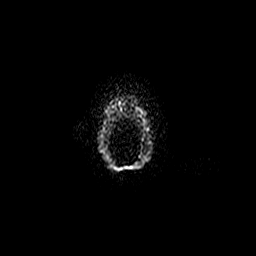

[Series 5: (id) mt fs · axial · 1.4mm · 0.43mm/px · z∈[-53,+22]mm · 7 of 136 slices shown]
[im 1/136]
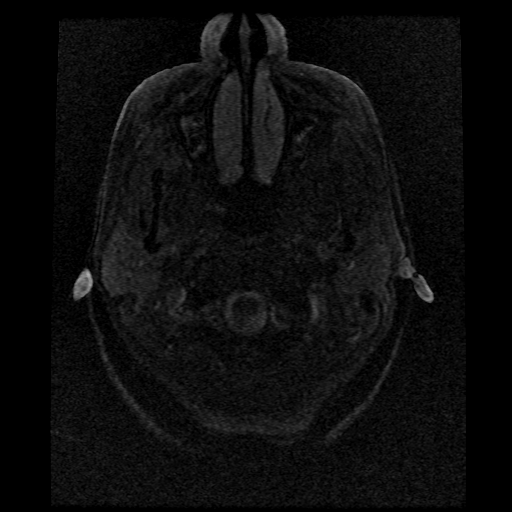
[im 28/136]
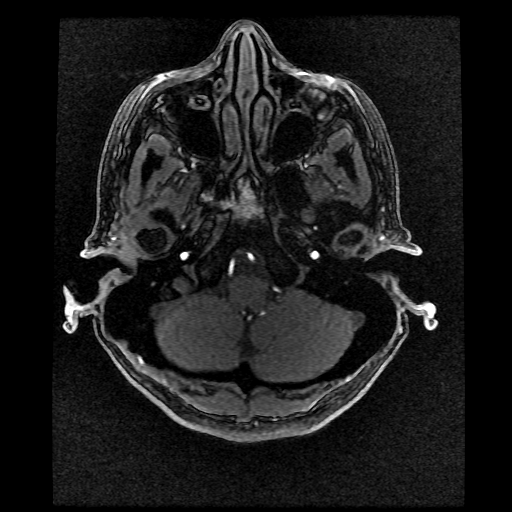
[im 41/136]
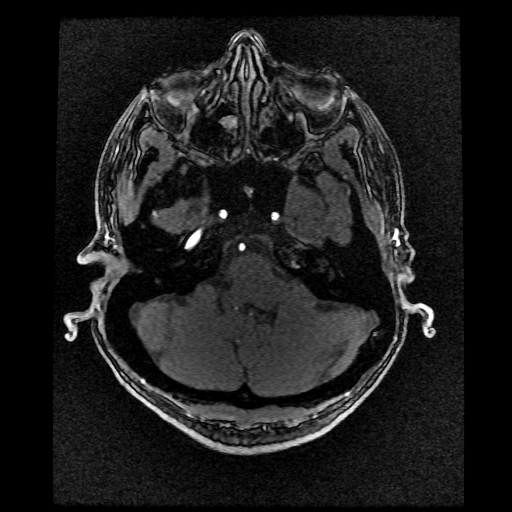
[im 55/136]
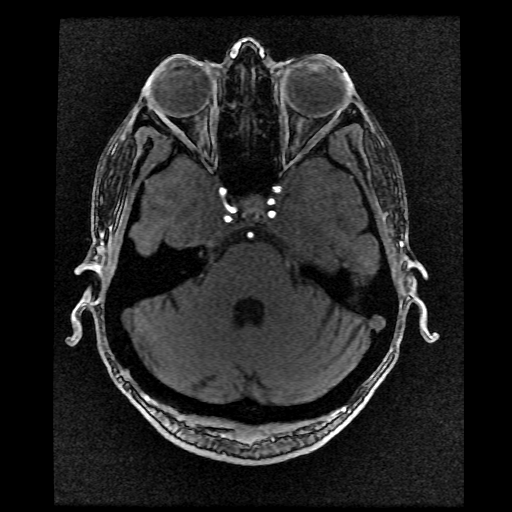
[im 82/136]
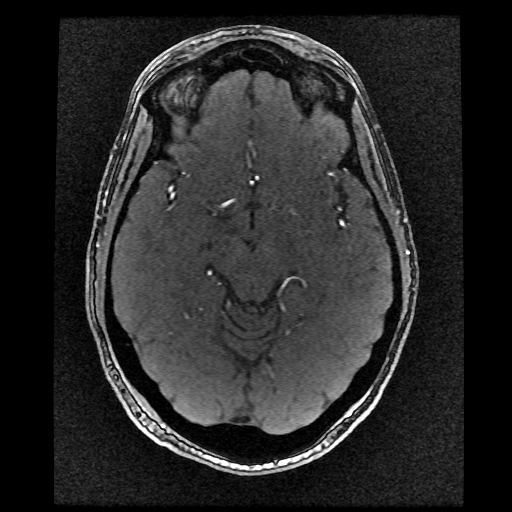
[im 95/136]
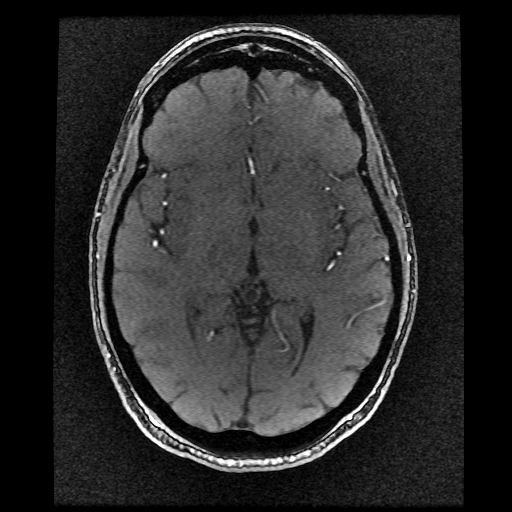
[im 109/136]
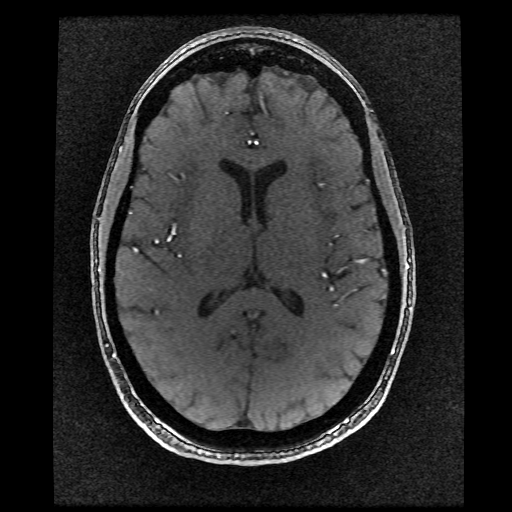

[Series 6: T2 · axial · 5.0mm · 0.43mm/px · z∈[-65,+85]mm · 2 of 26 slices shown (1 of 2)]
[im 1/26]
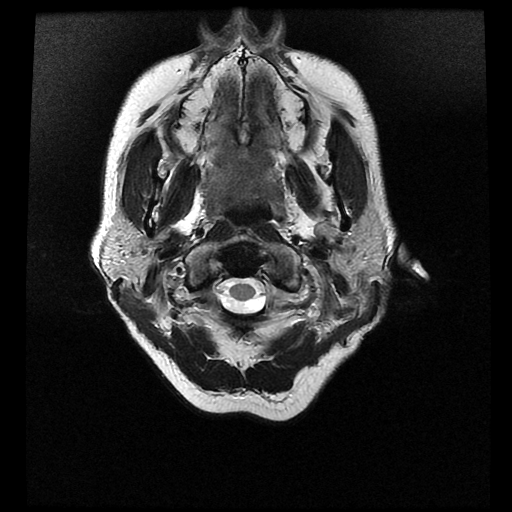
[im 26/26]
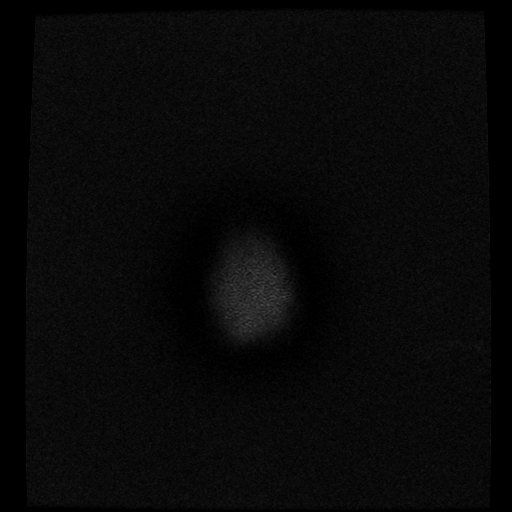

[Series 7: DWI · coronal · 5.0mm · 1.09mm/px · 5 of 70 slices shown (2 of 4)]
[im 1/70]
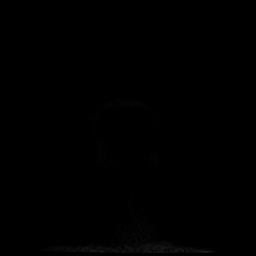
[im 18/70]
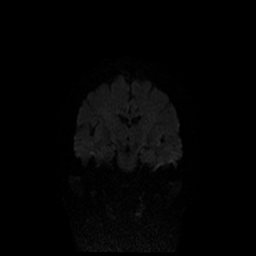
[im 35/70]
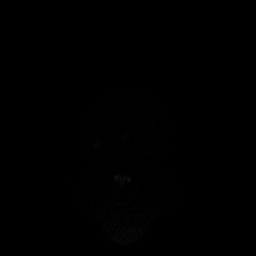
[im 52/70]
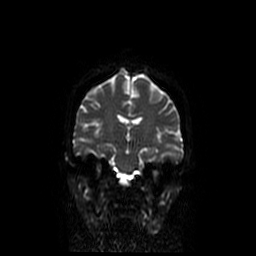
[im 70/70]
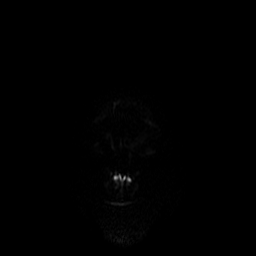

[Series 8: FLAIR · axial · 5.0mm · 0.43mm/px · z∈[-65,+85]mm · 2 of 26 slices shown]
[im 1/26]
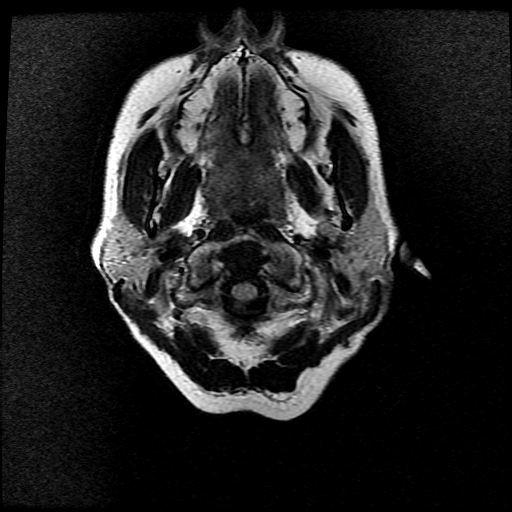
[im 26/26]
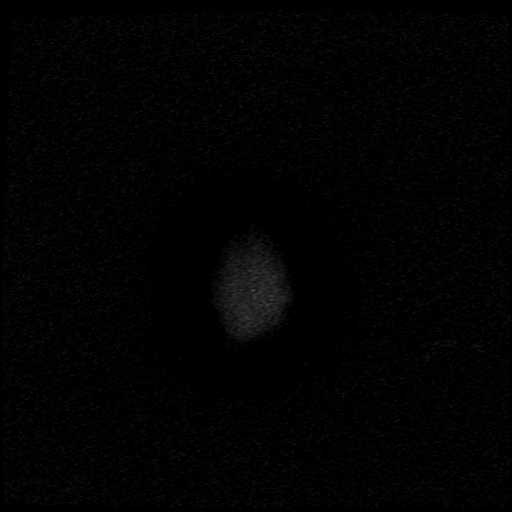

[Series 11: T2 · coronal · 5.0mm · 0.78mm/px · 2 of 27 slices shown (2 of 2)]
[im 1/27]
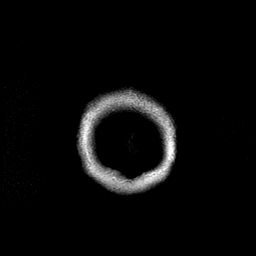
[im 27/27]
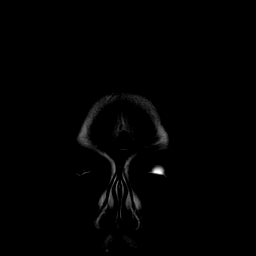

[Series 400: DWI · axial · 3.0mm · 1.09mm/px · z∈[-62,+82]mm · 4 of 49 slices shown (3 of 4)]
[im 1/49]
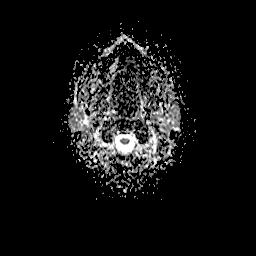
[im 17/49]
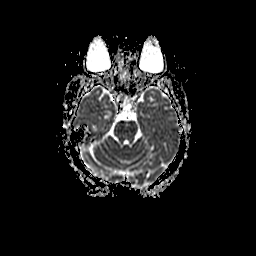
[im 33/49]
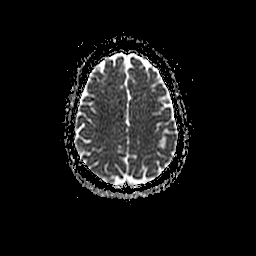
[im 49/49]
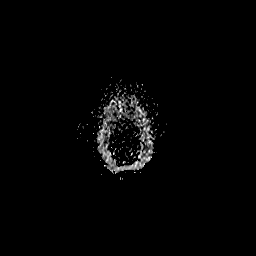

[Series 700: DWI · coronal · 5.0mm · 1.09mm/px · 3 of 35 slices shown (4 of 4)]
[im 1/35]
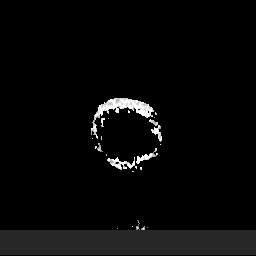
[im 18/35]
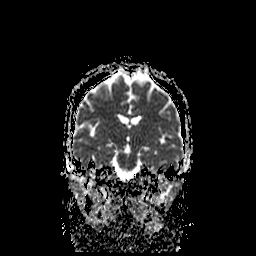
[im 35/35]
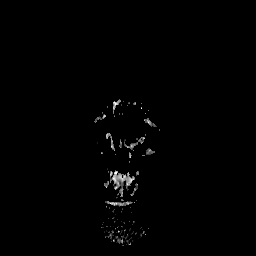

[33 of 48 positions shown; findings below may reference images not displayed]

FINDINGS: MRI HEAD FINDINGS

There is no evidence of acute infarct, intracranial hemorrhage,
midline shift, or extra-axial fluid collection. Ventricles and sulci
are normal. There are a few punctate foci of T2 hyperintensity in
the subcortical cerebral white matter which are nonspecific and not
greater than expected for patient's age. Additionally there is a 7
mm oval T2 hyperintense focus in the anterior right temporal lobe
which appears to involve cortex and is new from the prior MRI. There
is a tiny, chronic infarct in the left cerebellum which is new from
the prior MRI.

Prior right cataract extraction is noted. Paranasal sinuses and
mastoid air cells are clear. Major intracranial vascular flow voids
are preserved.

MRA HEAD FINDINGS

The visualized distal vertebral arteries are patent and codominant.
PICA, AICA, and SCA origins are patent. Basilar artery is patent
without stenosis. There is likely a tiny right posterior
communicating artery. PCAs are patent without evidence of
significant proximal stenosis.

The internal carotid arteries are patent from skullbase to carotid
termini without evidence of stenosis. The cavernous segments are
mildly tortuous bilaterally. ACAs and MCAs are patent without
evidence of significant proximal stenosis or major branch occlusion.
Anterior communicating artery is present. No intracranial aneurysm
is identified.
IMPRESSION: 1. No acute infarct.
2. Tiny chronic left cerebellar infarct.
3. New 7 mm T2 hyperintense lesion involving anterior right temporal
lobe cortex, indeterminate. Postcontrast imaging is recommended to
assess for small neoplasm.
4. No major intracranial arterial occlusion or significant stenosis.

## 2017-06-15 ENCOUNTER — Other Ambulatory Visit: Payer: Self-pay | Admitting: Pulmonary Disease

## 2017-08-03 ENCOUNTER — Ambulatory Visit (INDEPENDENT_AMBULATORY_CARE_PROVIDER_SITE_OTHER): Payer: Federal, State, Local not specified - PPO

## 2017-08-03 DIAGNOSIS — Z23 Encounter for immunization: Secondary | ICD-10-CM

## 2017-08-25 ENCOUNTER — Telehealth: Payer: Self-pay | Admitting: Pulmonary Disease

## 2017-08-25 NOTE — Telephone Encounter (Signed)
Spoke with pt who states she was on the Internet and saw that there was a recall on the losartan BP med, which is the one that she currently takes.  Pt wants to know if it is okay for her to still take this med or if we should change to a different BP med.   Spoke with Dr. Lenna Gilford who states that it is fine for her to continue taking the losartan.  Pt expressed understanding. Nothing further needed.

## 2017-08-29 ENCOUNTER — Other Ambulatory Visit: Payer: Self-pay | Admitting: Pulmonary Disease

## 2017-11-26 ENCOUNTER — Other Ambulatory Visit: Payer: Self-pay | Admitting: Pulmonary Disease

## 2017-12-27 ENCOUNTER — Other Ambulatory Visit: Payer: Self-pay | Admitting: Pulmonary Disease

## 2017-12-31 ENCOUNTER — Other Ambulatory Visit: Payer: Self-pay | Admitting: Pulmonary Disease

## 2018-01-16 ENCOUNTER — Other Ambulatory Visit: Payer: Self-pay | Admitting: Pulmonary Disease

## 2018-01-22 ENCOUNTER — Ambulatory Visit (INDEPENDENT_AMBULATORY_CARE_PROVIDER_SITE_OTHER)
Admission: RE | Admit: 2018-01-22 | Discharge: 2018-01-22 | Disposition: A | Discharge status: Home / Self Care | Payer: Federal, State, Local not specified - PPO | Source: Ambulatory Visit | Attending: Pulmonary Disease | Admitting: Pulmonary Disease

## 2018-01-22 ENCOUNTER — Encounter: Payer: Self-pay | Admitting: Pulmonary Disease

## 2018-01-22 ENCOUNTER — Ambulatory Visit: Payer: Federal, State, Local not specified - PPO | Admitting: Pulmonary Disease

## 2018-01-22 VITALS — BP 124/80 | HR 57 | Temp 98.3°F | Ht 62.0 in | Wt 137.8 lb

## 2018-01-22 DIAGNOSIS — K219 Gastro-esophageal reflux disease without esophagitis: Secondary | ICD-10-CM | POA: Diagnosis not present

## 2018-01-22 DIAGNOSIS — I8390 Asymptomatic varicose veins of unspecified lower extremity: Secondary | ICD-10-CM | POA: Diagnosis not present

## 2018-01-22 DIAGNOSIS — I773 Arterial fibromuscular dysplasia: Secondary | ICD-10-CM

## 2018-01-22 DIAGNOSIS — I10 Essential (primary) hypertension: Secondary | ICD-10-CM

## 2018-01-22 DIAGNOSIS — D329 Benign neoplasm of meninges, unspecified: Secondary | ICD-10-CM

## 2018-01-22 DIAGNOSIS — E7800 Pure hypercholesterolemia, unspecified: Secondary | ICD-10-CM

## 2018-01-22 DIAGNOSIS — D126 Benign neoplasm of colon, unspecified: Secondary | ICD-10-CM | POA: Diagnosis not present

## 2018-01-22 DIAGNOSIS — F411 Generalized anxiety disorder: Secondary | ICD-10-CM | POA: Diagnosis not present

## 2018-01-22 DIAGNOSIS — Z0000 Encounter for general adult medical examination without abnormal findings: Secondary | ICD-10-CM

## 2018-01-22 MED ORDER — LOSARTAN POTASSIUM 100 MG PO TABS: 100.0000 mg | ORAL_TABLET | Freq: Every day | ORAL | 3 refills | Status: DC

## 2018-01-22 MED ORDER — ROSUVASTATIN CALCIUM 10 MG PO TABS: 10.0000 mg | ORAL_TABLET | Freq: Every day | ORAL | 3 refills | Status: DC

## 2018-01-22 MED ORDER — NEBIVOLOL HCL 10 MG PO TABS: ORAL_TABLET | ORAL | 3 refills | Status: DC

## 2018-01-22 MED ORDER — AMLODIPINE BESYLATE 5 MG PO TABS: 5.0000 mg | ORAL_TABLET | Freq: Every day | ORAL | 3 refills | Status: DC

## 2018-01-22 MED ORDER — LORAZEPAM 1 MG PO TABS: 1.0000 mg | ORAL_TABLET | Freq: Every day | ORAL | 3 refills | Status: DC

## 2018-01-22 NOTE — Progress Notes (Signed)
Subjective:     Patient ID: [name] [name], female   DOB: 11/14/1950, 67 y.o.   MRN: [id number]  HPI 67 y/o BF here for a follow up visit... she has mult med problems as noted....  ~  SEE PREV EPIC NOTES FOR OLDER DATA >>     LABS 4/14:  Chems- wnl w/ K=3.6;  CBC- wnl;  TSH=1.90;  VitD=32 & rec to take 1000u OTC supplement daily...  CXR 11/15 showed norm heart size, clear lungs, NAD.Marland KitchenMarland Kitchen  EKG 11/15 showed NSR, rate71, wnl, NAD...  LABS 1/16:  FLP- not at goals on diet alone w/ LDL=145;  Chems- ok x K=3.3 & rec dietary supplement;  CBC- wnl;  TSH=1.78;  VitD=17...   ~  September 30, 2015:  32mo ROV & annual CPX>  [name] [name] reports doing well, feels good, no new complaints or concerns;  She remains active walking her [place] Husky "[name]" 51mi/d;  We reviewed the following medical problems during today's office visit >>     HBP> on ASA81 & LOSAR100; BP= 138/64 & she denies CP, palpit, dizzy, SOB, edema, etc; continue same & low sodium...    VV> prev eval at vein clinic DrFeatherston w/ surg; she knows to elim sodium, elev legs, wear support hose; denies pain, swelling, leg lesions...    CHOL> on diet alone; last FLP 1/16 showed TChol 237, TG 71, HDL 78, LDL 145; we reviewed low chol/ low fat diet...    GI- GERD, Polyps> on OTC Prilosec vs Zantac prn; f/u colon 11/12 by DrPatterson was neg & he rec f/u 68yrs...    GYN> on Premarin0.3 and BlackCohosh per Andree Coss (seen yearly); she is s/p hyst; mammograms neg at [facility] annually (last 10/16)...    VitD defic> VitD level 1/16 = 17 & asked to incr OTC supplement to 5000u daily but she didn't do it...     Anxiety> on Ativan 0.5mg - 1/2 to 1 Tid prn; stress w/ care of motherLorenza [name]; med refilled...    Hx idiopathic urticaria> she believed this was due to wt lifting in the past; no recurrence... EXAM shows Afeb, VSS, O2sat=100% on RA;  HEENT- neg, mallampati2;  Chest- clear w/o w/r/r;  Heart- RR w/o m/r/g;  Abd- soft, neg;  Ext- neg w/o c/c/e;   Neuro- intact...  FASTING LABS>  She is not fasting today & will ret for labs at her convenience...  IMP/PLAN>>  Sunjai is stable & rec to continue same meds, ret for FLabs, consider low dose statin med for her lipids (she declines)...  ~  Jan 19, 2016:  19mo ROV & post hospital visit>  [name] [name] was [facility] 3/28 - 12/16/15 by [facility] w/ dizziness of several wks duration, developed weakness & fell (called 911); no HA, no LOC, focal weakness, speech disturbance, etc; In the ER her exam was NEG & an MRI Brain showed a sm 7mm T2enhancing lesion in right temporal lobe cortex c/w meningioma, no acute infarct, tiny chr left cbll infarct;  Neuro was consulted & they wondered if the dizziness may be an epileptiform seizure & started Keppra rx=> EEG was NEG; Neuro wanted [facility] to do a ?metastatic? work-up- this was NEG as well (see below); Disch on Keppra500Bid for Neuro follow up...    In the interim the pt had some milder dizziness but noted that BP was also elev at home & at [facility] office ~190/90 & he felt that the BP likely caused the dizziness, not a seizure, and the meningioma is likely  incidental; pt has f/u appt w/ NS, DrDitty (she indicates that they will follow this lesion);  She called w/ BP elev & we called in additional meds adding Bystolic10 to her Losartan100... EXAM shows Afeb, BP=160/80, O2sat=98% on RA;  BP recently elev from 120/80 to 190/100, HEENT- neg, mallampati2;  Chest- clear w/o w/r/r;  Heart- RR w/o m/r/g;  Abd- soft, +faint abd bruit heard;  Ext- neg w/o c/c/e;  Neuro- intact...  LABS 11/2015> reviewed  CT Chest was neg- showing norm heart size, no adenopathy, no PE seen; no consolidative airsp dis/ nodules/ or masses, no effusion etc; mild degen changes in Tspine, bilat breast prosthesis...  CT Abd&Pelvis showed norm liver/ GB/ pancreas/ spleen; normal kidneys, adrenals, bladder; stomach & bowels ok w/ mild divertics, s/p hyst, no adenopathy; nonspecific sclerotic foci in sternum, T5 &  L4=> rec bone scan.  Nuclear Med Bone scan 12/29/15> NEG, no abn uptake... IMP/PLAN>>  She has had signif elev in BP along w/ dizziness & the weak spell that resulted in ER visit & 1d Hosp; seems more likely that BP caused the dizziness & hopefully the 7mm meningioma is just incidental (she tells me that NS, DrDitty will be following this lesion);  Her BP has been easily controlled in the past & just suddenly elev & requiring increased medication, coupled w/ Abd bruit on exam=> proceed w/ CTA of abd & pelvis to check Ao & bilat renal arteries... For now we will Add Amlodipine5 to her regimen...  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 We will refer to Endovascular specialist for further eval.  Bilat renal arteriograms 02/11/16 by DrChen>  bilat renal art fibromuscular dysplasia but w/o hemodynamic stenoses seen...   ~  March 23, 2016:  478mo ROV & Diane had her renal arteriogram by DrChen as above +FMD but no signif stenosis;  Her BP has been satis controlled on her 3 meds:  Bystolic10, Amlodipine5, Losartan100 => BP=124/78 today she pt confirms similar readings at home (she notes her high has been ~150sys);  No new complaints or concerns- she states that she feels OK, just one episode of dizziness recently & since she has stopped the Keppra (6/14) & started driving again she notes the exit ramps w/ steep turn to the right brings on the dizziness!  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 She will follow up w/ [place] in ~20months.  ~  May 26, 2016:  478mo ROV & Arilyn reports that she is doing well now- she has retired from the post office & feeling well just very busy...    HBP> on ASA81, Amlod5, Bystolic10, Losar100; BP= 108/64 & she denies CP, palpit, dizzy, SOB, edema, etc; continue same & low sodium diet...    VV> prev eval at vein clinic  DrFeatherston w/ surg; she knows to elim sodium, elev legs, wear support hose; denies pain, swelling, leg lesions...    CHOL> on diet alone; last FLP 1/16 showed TChol 237, TG 71, HDL 78, LDL 145; not at goals but she does not want meds- we reviewed low chol/ low fat diet...    GI- GERD, Polyps> on OTC Prilosec vs Zantac prn; f/u colon 11/12 by DrPatterson was neg & he rec f/u 67yrs...    GYN> on Premarin0.3 and BlackCohosh per Andree Coss (seen yearly); she is s/p hyst; mammograms neg at [facility] annually (last 10/16)...    VitD defic> VitD level 1/16 = 17 & asked to incr OTC supplement to 5000u daily but she didn't do it...     Anxiety> prev on Ativan 0.5mg  prn; stress w/ care of motherLorenza [name]...     Hx idiopathic urticaria> she believed this was due to wt lifting in the past; no recurrence... EXAM shows Afeb, BP=160/80, O2sat=98% on RA;  BP recently elev from 120/80 to 190/100, HEENT- neg, mallampati2;  Chest- clear w/o w/r/r;  Heart- RR w/o m/r/g;  Abd- soft, +faint abd bruit heard;  Ext- neg w/o c/c/e;  Neuro- intact... IMP/PLAN>>  Jolean is much improved w/ resolution of most of her stress since she retired;  We discussed continuing current meds, diet, exercise...   ~  January 02, 2017:  34mo ROV & Davionna returns w/ a CC of not being able to lift left arm all the way up, no known injury, no prev problem w/ arms/ shoulders and we will refer to  Ortho for XRays and eval... She has been under a lot of stress- both mother 7 father passed within 1week of each other; she has Lorazepam 1mg  that she uses if flying & reminded to try 1/2 to 1 tab up to Tid prn for anxiety... She will ret for FASTING blood work...  We reviewed the following medical problems during today's office visit>      HBP> on ASA81, Amlod5, Bystolic10, Losar100; BP= 108/62 & she denies CP, palpit, dizzy, SOB, edema, etc; continue same & low sodium diet; Vasc Surg eval 01/2016 w/ bilat fibromusc hyperplasia of renal arteries but w/o hemodynamic stenoses...    VV> prev eval at vein clinic DrFeatherston w/ surg; she knows to elim sodium, elev legs, wear support hose; denies pain, swelling, leg lesions...    CHOL> on diet alone; last FLP 1/16 showed TChol 237, TG 71, HDL 78, LDL 145; not at goals but she does not want meds; Repeat FLP 01/17/17 shows TChol 247, TG 79, HDL 76, LDL 155 (we discussed that low dose statin would be helpful, offered alternative Chol meds, she refuses all interventions and will continue diet + exercise...     GI- GERD, Polyps> on OTC Prilosec vs Zantac prn; f/u colon 11/12 by DrPatterson was neg & he rec f/u 80yrs...    GYN> on Premarin0.3 and BlackCohosh per Andree Coss (seen yearly); she is s/p hyst; mammograms neg at [facility] annually     Meningioma>  MRI Brain 11/2015 showed a sm 7mm T2-enhancing lesion in right temporal lobe cortex c/w incidental meningioma, no acute infarct, tiny chr left cbll infarct; she has seen DrPenumali for Neuro, and DrDitty for NS; she stopped Keppra, prev hx dizziness has resolved...      VitD defic> VitD level 1/16 = 17 & asked to incr OTC supplement;  F/u labs 5/18 showed VitD = 57 & rec to continue supplementation    Anxiety>  prev on Ativan 0.5mg  prn; stress w/ care of mother- [name] [name] (pancreatic Ca w/ hepatic mets) => mother & father passed within 1 week of each other...    Hx idiopathic urticaria> she believed this was due  to wt lifting in the past; no recurrence... EXAM shows Afeb, BP=108/62, O2sat=98% on RA;  HEENT- neg, mallampati2;  Chest- clear w/o w/r/r;  Heart- RR w/o m/r/g;  Abd- soft, +faint abd bruit heard;  Ext- neg w/o c/c/e;  Neuro- intact...  LABS 01/17/17>  FLP- not at goals on diet alone w/ TChol=247, LDL=155 (Pt refuses statin rx);  Chems- wnl w/ K=3.5, BS=84, Cr=1.00, LFTs wnl;  CBC- wnl w/ Hg=13.7;  TSH=2.25;  VitD=57;  HepC NEG IMP/PLAN>>  We will refer to Ortho to eval the left arm/ shoulder pain & decr ROM;  Fasting labs ret w/ elev TChol/ LDL but she declines meds, prefers diet + exercise despite her lack of response;  BP controlled, no neurologic symptoms, anxiety is on the wane...   ~  Jan 22, 2018:  21yr ROV & CPX>  Diane reports that she is doing well but notes she's not checking her BP often, denies recurrent HAs, but notes occas dizziness; she has not had any interval Epic visits over the last year...  We reviewed the following medical problems during today's office visit>      HBP> on ASA81, Amlod5, Bystolic10, Losar100; BP= 124/80 & she denies CP, palpit, SOB, edema; notes occas dizzy; continue same meds & low sodium diet; Vasc Surg eval 01/2016 w/ bilat fibromusc hyperplasia of renal arteries but w/o hemodynamic stenoses & we are following BP going forward...    VV> prev eval at vein clinic DrFeatherston w/ surg; she knows to elim sodium, elev legs, wear support hose; denies pain, swelling, leg lesions...    CHOL> prev on diet alone & last FLP 5/18 showed TChol 247, TG 79, HDL 76, LDL 155 (prev refused all interventions, then she agreed to start CRESTOR10=> f/u FLP 01/26/18 shows TChol 148, TG 64, HDL 67, LDL 69    GI- GERD, Polyps> on OTC Prilosec vs Zantac prn; f/u colon 11/12 by DrPatterson was neg & he rec f/u 60yrs...    GYN> on Premarin0.3 and BlackCohosh per Andree Coss (seen yearly); she is s/p hyst; mammograms neg at [facility] annually     Meningioma>  MRI Brain 11/2015 showed a sm 7mm  T2-enhancing lesion in right temporal lobe cortex c/w incidental meningioma, no acute infarct, tiny chr left cbll infarct; she has seen DrPenumali for Neuro, and DrDitty for NS; she stopped Keppra, prev hx dizziness has resolved => needs NEURO follow up appt 7 we will set this up for her.    VitD defic> VitD level 1/16 = 17 & asked to incr OTC supplement;  F/u labs 5/18 showed VitD = 57 & rec to continue supplementation    Anxiety> prev on Ativan 0.5mg  prn; stress w/ care of mother- [name] [name] (pancreatic Ca w/ hepatic mets) => mother & father passed within 1 week of each other...    Hx idiopathic urticaria> she believed this was due to wt lifting in the past; no recurrence... EXAM shows Afeb, BP=108/62, O2sat=98% on RA;  HEENT- neg, mallampati2;  Chest- clear w/o w/r/r;  Heart- RR w/o m/r/g;  Abd- soft, +faint abd bruit heard;  Ext- neg w/o c/c/e;  Neuro- intact...  CXR 01/22/18 (independently reviewed by me in the PACS system) shows norm heart size, clear lungs- NAD, bilat breast prostheses.Marland KitchenMarland Kitchen  EKG 01/26/18 showed SBrady, rate56, ?LAE, otherw wnl- NAD...   LABS 01/26/18>  FLP- at goals on Cres10;  Chems- ok x K=3.3, Cr=0.96, BS=79, LFTs wnl;  CBC- ok w/ Hg=12.5;  TSH=1.80 IMP/PLAN>>  Ibeth has had a quiet yr- no new complaints or concerns;  BP remains well controlled on her 3 meds and Chol is now wnl on low dose Crestor- continue same;  She is in need of Neuro follow up appt for her prev abn MRI & we will get her an appt w/ DrPenumali...           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  ~  10/11:  BP= 126/78, tolerating rx well... denies HA, fatigue, visual changes, CP, palipit, dizziness, syncope, dyspnea, edema, etc... ~  1/13:  BP= 132/80 & her fatigue is resolved, feeling better, currently asymptomatic... ~   4/14: on ASA81, Avapro150, HCT12.5, K20; BP= 138/78 & she denies CP, palpit, dizzy, SOB, edema, etc. ~  11/15: on ASA81 & ran out of Avapro150/ HCT12.5/ K20- all ~81mo ago; BP= 180/90 & she denies CP, palpit, dizzy, SOB, edema, etc; we decided to change to LOSARTAN100mg /d, low sodium, etc. ~  1/16: on ASA81 & Cozaar100; BP= 142/90 but hasn't taken meds today; remains asymptomatic; reminded to take med daily in AM... ~  7/16: BP controlled on her regular Cozaar100 (tol well) w/ BP=134/70 today & she denies HA, visual sx, CP, palpit, SOB, edema, etc... ~  1/17: on ASA81 & LOSAR100; BP= 138/64 & she denies CP, palpit, dizzy, SOB, edema, etc; continue same & low sodium. ~  01/2016> she was [facility] briefly w/ dizziness & weakness, MRI showed a 7mm meningioma in the right temporal lobe, placed on Keppra but subseq EEGs were neg, BP meds adjusted w/ Bystolic10 & Amlod5 added to her Losar100; Exam revealed a faint abd bruit=> CTA Abd showed Bilat renal art fibromuscular hyperplasia beading changes... refewrred to [facility] End[facility] specialist for further eval. ~  02/11/16> she had bilat renal arteriography confirming the presence of bilat fibromuscular dysplasia BUT no signif RA stenoses... ~  BP appears to be well controlled on Bystolic10, Amlodipine5, Losartan100...  VARICOSE VEINS, LOWER EXTREMITIES (ICD-454.9) - she has been eval at the Vein clinic and had [facility] surg by DrFeatherston (sclerotherapy w/o help she says)... she avoids sodium, elevates legs, wears support hose...  HYPERCHOLESTEROLEMIA (ICD-272.0) - on diet alone and has a high HDL... ~  last FLP was 6/05 showing TChol 236, Tg 148, HDL 83, LDL 125... I told her she may need a low dose statin med to get her TChol & LDL to goal... ~  FLP 10/10 showed TChol 222, TG 63, HDL 92, LDL 118... rec better diet effort. ~  FLP 1/13 on diet alone showed  TChol 205, TG 27, HDL 92, LDL 98 ~  FLP 1/16 on diet alone showed TChol 237, TG 71, HDL 78, LDL 145... rec to start  [facility]- 1/2 tab daily (she chose not to & prefers diet alone)... ~  FLP 1/17 on diet alone - TChol 237, TG 71, HDL 78, LDL 145 and she declines to consider med rx, prefers diet alone despite these numbers...  GERD (ICD-530.81) - occas reflux symptoms treated w/ Prilosec vs Zantac OTC...  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 follow up planned 52yrs & we will refer to GI for this important screening procedure. ~  F/u colonoscopy done 11/12 by DrPatterson was neg> no polyps or cancers, no rectal mass, f/u suggested in 108yrs...  HEADACHE (ICD-784.0) - she believes related to stress...  VITAMIN D DEFICIENCY >> ~  Labs 4/14 showed Vit D level = 32 and she is rec to start OTC Vit D supplement ~1000u daily (she didn't do it). ~  Labs 1/16 showed Vit D level = 17 and she is rec to start OTC Vit D supplement = 5000u daily...  MENINGIOMA >> MRI Brain 11/2015 showed a sm 7mm T2enhancing lesion in right temporal lobe cortex c/w meningioma, no acute infarct, tiny chr left cbll infarct;  Neuro was consulted & they wondered if the dizziness may be an epileptiform seizure & started Keppra rx=> EEG was NEG; Neuro wanted [facility] to do a ?metastatic? work-up- this was NEG as well (see below); Disch on Keppra500Bid for Neuro follow up & DrPenumalli felt the meningioma was prob incidental, pt indcatres that she will be followed by NS, DrDitty...  ANXIETY (ICD-300.00) - she uses LORAZEPAM 0.5mg  as needed for anxiety... eg. during storms etc...  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  Health Maintenance:  Also takes ASA 81mg /d... GYN= DrHorvath, on Premarin 0.3mg /d, s/p hysterectomy... Mammograms yearly at [facility] (she has implants)- and they follow closely... she gets the seasonal Flu vaccine yearly... given TDAP booster 10/11...   Past  Surgical History:  Procedure Laterality Date  . benign breast biopsy    . bilateral breast implants    . cataract right eye w/ lens implant    . COLONOSCOPY    . excision of large anal polyp    . PERIPHERAL VASCULAR CATHETERIZATION N/A 02/11/2016   Procedure: Renal Angiography;  Surgeon: Fransisco Hertz, MD;  Location: [facility] INVASIVE CV LAB;  Service: Cardiovascular;  Laterality: N/A;  . POLYPECTOMY    . VAGINAL HYSTERECTOMY    . varicose vein      Outpatient Encounter Medications as of 01/22/2018  Medication Sig  . amLODipine (NORVASC) 5 MG tablet Take 1 tablet (5 mg total) by mouth daily.  Marland Kitchen aspirin EC 81 MG tablet Take 81 mg by mouth daily.  . cholecalciferol (VITAMIN D) 1000 units tablet Take 1,000 Units by mouth daily.  Marland Kitchen estrogens, conjugated, (PREMARIN) 0.3 MG tablet Take 0.3 mg by mouth See admin instructions. Take daily for 21 days then do not take for 7 days.  Marland Kitchen LORazepam (ATIVAN) 1 MG tablet Take 1 tablet (1 mg total) by mouth at bedtime.  Marland Kitchen losartan (COZAAR) 100 MG tablet Take 1 tablet (100 mg total) by mouth daily.  . nebivolol (BYSTOLIC) 10 MG tablet TAKE 1 TABLET (10 MG TOTAL) BY MOUTH DAILY.  . rosuvastatin (CRESTOR) 10 MG tablet Take 1 tablet (10 mg total) by mouth daily.  . vitamin E 400 UNIT capsule Take 400 Units by mouth daily.  . [DISCONTINUED] amLODipine (NORVASC) 5 MG tablet Take 1 tablet (5 mg total) by mouth daily.  . [DISCONTINUED] BYSTOLIC 10 MG tablet TAKE 1 TABLET (10 MG TOTAL) BY MOUTH DAILY.  . [DISCONTINUED] LORazepam (ATIVAN) 1 MG tablet Take 1 tablet (1 mg total) by mouth at bedtime.  . [DISCONTINUED] losartan (COZAAR) 100 MG tablet Take 1 tablet (100 mg total) by mouth daily.  . [DISCONTINUED] losartan (COZAAR) 100  MG tablet TAKE 1 TABLET BY MOUTH EVERY DAY  . [DISCONTINUED] rosuvastatin (CRESTOR) 10 MG tablet TAKE 1 TABLET BY MOUTH EVERY DAY   No facility-administered encounter medications on file as of 01/22/2018.     No Known Allergies    Immunization  History  Administered Date(s) Administered  . Influenza Split 06/13/2011, 07/19/2012  . Influenza Whole 07/14/2009, 07/14/2010  . Influenza,inj,Quad PF,6+ Mos 08/13/2013, 07/28/2014, 08/25/2015, 05/26/2016, 08/03/2017  . Td 07/14/2010    Current Medications, Allergies, Past Medical History, Past Surgical History, Family History, and Social History were reviewed in [employer] record.    Review of Systems       See HPI - all other systems neg except as noted...  The patient denies anorexia, fever, weight loss, weight gain, vision loss, decreased hearing, hoarseness, chest pain, syncope, dyspnea on exertion, peripheral edema, prolonged cough, headaches, hemoptysis, abdominal pain, melena, hematochezia, severe indigestion/heartburn, hematuria, incontinence, muscle weakness, suspicious skin lesions, transient blindness, difficulty walking, depression, unusual weight change, abnormal bleeding, enlarged lymph nodes, and angioedema.    Objective:   Physical Exam     WD, WN, 66 y/o BF in NAD... GENERAL:  Alert & oriented; pleasant & cooperative... HEENT:  [place]/AT, EOM-wnl, PERRLA, EACs-clear, TMs-wnl, NOSE-clear, THROAT-clear & wnl. NECK:  Supple w/ full ROM; no JVD; normal carotid impulses w/o bruits; no thyromegaly or nodules palpated; no lymphadenopathy. CHEST:  Clear to P & A; without wheezes/ rales/ or rhonchi. HEART:  Regular Rhythm; without murmurs/ rubs/ or gallops. ABDOMEN:  Soft & nontender; normal bowel sounds & faint abd bruit heard; no organomegaly or masses detected. EXT: without deformities or arthritic changes; no varicose veins/ venous insuffic/ or edema. NEURO:  CN's intact; motor testing normal; sensory testing normal; gait normal & balance OK. DERM:  No lesions noted; no rash etc...  RADIOLOGY DATA:  Reviewed in the EPIC EMR & discussed w/ the patient...  LABORATORY DATA:  Reviewed in the EPIC EMR & discussed w/ the patient...   Assessment:       HBP w/ FMD of renal arts but no stenoses>  1/16 - 5/17>  started on LOSARTAN100 11/15 and BP improved on Rx; not checking BP at home & reminded to do so; f/u BP 2017 as high has 200/100 and meds added=> Bystolic10 & Amlod5 w/ good control... 01/2016>  Further eval w/ CTA abd revealed bilat renal fibromusc hyperplasia & pt referred to [facility] specialist... 02/11/16> she had  bilat renal arteriography confirming the presence of bilat fibromuscular dysplasia BUT no signif RA stenoses... BP remains well controlled on her 3 med regimen... 05/26/16> BP remains controlled on her 3 med regimen... 01/02/17>  We will refer to Ortho to eval the left arm/ shoulder pain & decr ROM;  Fasting labs ret w/ elev TChol/ LDL but she declines meds, prefers diet + exercise despite her lack of response;  BP controlled, no neurologic symptoms, anxiety is on the wane. 01/22/18>   Gizell has had a quiet yr- no new complaints or concerns;  BP remains well controlled on her 3 meds and Chol is now wnl on low dose Crestor- continue same;  She is in need of Neuro follow up appt for her prev abn MRI 7 we will get her an appt w/ DrPenumali   VV, VI>  Prev eval at the Vein clinic w/ surg by DrFeatherston...  CHOL>  On diet alone & FLP has not been at goal- she has declined statin & non-statin alternative meds for her Chol.Marland KitchenMarland Kitchen  GI> GERD, Polyps>  She uses OTC acid suppressors & had f/u colonoscopy 11/12 (neg)...  Hx HAs>  She denies recent HAs etc...  MENINGIOMA>  7mm right temp lobe lesion eval by Neuro & NS 11/2015 Hosp... We will arrange for f/u appt w/ Neuro...  Vit D Defic>  She was not taking VitD supplements as requested; labs 1/16 showed Vit D level = 17 & she s rec to start VitD 5000u daily but she reverted to OTC 1000u supplement!  Anxiety>  Prev used Lorazepam as needed & much better since she's retired...     Plan:     Patient's Medications  New Prescriptions   No medications on file  Previous Medications    ASPIRIN EC 81 MG TABLET    Take 81 mg by mouth daily.   CHOLECALCIFEROL (VITAMIN D) 1000 UNITS TABLET    Take 1,000 Units by mouth daily.   ESTROGENS, CONJUGATED, (PREMARIN) 0.3 MG TABLET    Take 0.3 mg by mouth See admin instructions. Take daily for 21 days then do not take for 7 days.   VITAMIN E 400 UNIT CAPSULE    Take 400 Units by mouth daily.  Modified Medications   Modified Medication Previous Medication   AMLODIPINE (NORVASC) 5 MG TABLET amLODipine (NORVASC) 5 MG tablet      Take 1 tablet (5 mg total) by mouth daily.    Take 1 tablet (5 mg total) by mouth daily.   LORAZEPAM (ATIVAN) 1 MG TABLET LORazepam (ATIVAN) 1 MG tablet      Take 1 tablet (1 mg total) by mouth at bedtime.    Take 1 tablet (1 mg total) by mouth at bedtime.   LOSARTAN (COZAAR) 100 MG TABLET losartan (COZAAR) 100 MG tablet      Take 1 tablet (100 mg total) by mouth daily.    Take 1 tablet (100 mg total) by mouth daily.   NEBIVOLOL (BYSTOLIC) 10 MG TABLET BYSTOLIC 10 MG tablet      TAKE 1 TABLET (10 MG TOTAL) BY MOUTH DAILY.    TAKE 1 TABLET (10 MG TOTAL) BY MOUTH DAILY.   ROSUVASTATIN (CRESTOR) 10 MG TABLET rosuvastatin (CRESTOR) 10 MG tablet      Take 1 tablet (10 mg total) by mouth daily.    TAKE 1 TABLET BY MOUTH EVERY DAY  Discontinued Medications   LOSARTAN (COZAAR) 100 MG TABLET    TAKE 1 TABLET BY MOUTH EVERY DAY

## 2018-01-22 NOTE — Patient Instructions (Addendum)
Today we updated your med list in our EPIC system...    Continue your current medications the same...    We refilled your meds per request...  Today we did a follow up CXR & EKG... Please return to our lab one morning this week for your FASTING blood work...    We will contact you w/ the results when available...   We will arrange for an appt w/ your Neurologist- DrPenumali regarding the small meningioma seen on prev MRI scan, and your dizziness sensation...  Continue to monitor your BP periodically & call for any issues...  Call for any questions or if we can be of service in any way.Marland KitchenMarland Kitchen

## 2018-01-23 ENCOUNTER — Ambulatory Visit: Payer: Federal, State, Local not specified - PPO | Admitting: Pulmonary Disease

## 2018-01-26 ENCOUNTER — Other Ambulatory Visit (INDEPENDENT_AMBULATORY_CARE_PROVIDER_SITE_OTHER): Payer: Federal, State, Local not specified - PPO

## 2018-01-26 ENCOUNTER — Telehealth: Payer: Self-pay | Admitting: Pulmonary Disease

## 2018-01-26 DIAGNOSIS — Z0000 Encounter for general adult medical examination without abnormal findings: Secondary | ICD-10-CM | POA: Diagnosis not present

## 2018-01-26 DIAGNOSIS — F411 Generalized anxiety disorder: Secondary | ICD-10-CM

## 2018-01-26 DIAGNOSIS — I10 Essential (primary) hypertension: Secondary | ICD-10-CM

## 2018-01-26 LAB — LIPID PANEL
CHOLESTEROL: 148 mg/dL (ref 0–200)
HDL: 66.7 mg/dL (ref >39.00)
LDL CALC: 69 mg/dL (ref 0–99)
NonHDL: 81.6
TRIGLYCERIDES: 64 mg/dL (ref 0.0–149.0)
Total CHOL/HDL Ratio: 2
VLDL: 12.8 mg/dL (ref 0.0–40.0)

## 2018-01-26 LAB — COMPREHENSIVE METABOLIC PANEL
ALBUMIN: 4 g/dL (ref 3.5–5.2)
ALT: 27 U/L (ref 0–35)
AST: 29 U/L (ref 0–37)
Alkaline Phosphatase: 77 U/L (ref 39–117)
BILIRUBIN TOTAL: 0.5 mg/dL (ref 0.2–1.2)
BUN: 12 mg/dL (ref 6–23)
CALCIUM: 9 mg/dL (ref 8.4–10.5)
CHLORIDE: 103 meq/L (ref 96–112)
CO2: 30 meq/L (ref 19–32)
CREATININE: 0.96 mg/dL (ref 0.40–1.20)
GFR: 75.04 mL/min (ref >60.00)
Glucose, Bld: 79 mg/dL (ref 70–99)
Potassium: 3.3 mEq/L — ABNORMAL LOW (ref 3.5–5.1)
SODIUM: 141 meq/L (ref 135–145)
Total Protein: 7.4 g/dL (ref 6.0–8.3)

## 2018-01-26 LAB — CBC WITH DIFFERENTIAL/PLATELET
BASOS ABS: 0 10*3/uL (ref 0.0–0.1)
BASOS PCT: 0.6 % (ref 0.0–3.0)
EOS ABS: 0.1 10*3/uL (ref 0.0–0.7)
Eosinophils Relative: 1.6 % (ref 0.0–5.0)
HEMATOCRIT: 37.1 % (ref 36.0–46.0)
Hemoglobin: 12.5 g/dL (ref 12.0–15.0)
LYMPHS PCT: 27.8 % (ref 12.0–46.0)
Lymphs Abs: 1.6 10*3/uL (ref 0.7–4.0)
MCHC: 33.6 g/dL (ref 30.0–36.0)
MCV: 88.8 fl (ref 78.0–100.0)
Monocytes Absolute: 0.6 10*3/uL (ref 0.1–1.0)
Monocytes Relative: 11.1 % (ref 3.0–12.0)
NEUTROS ABS: 3.3 10*3/uL (ref 1.4–7.7)
NEUTROS PCT: 58.9 % (ref 43.0–77.0)
PLATELETS: 268 10*3/uL (ref 150.0–400.0)
RBC: 4.18 Mil/uL (ref 3.87–5.11)
RDW: 13.4 % (ref 11.5–15.5)
WBC: 5.6 10*3/uL (ref 4.0–10.5)

## 2018-01-26 LAB — TSH: TSH: 1.8 u[IU]/mL (ref 0.35–4.50)

## 2018-01-26 NOTE — Telephone Encounter (Signed)
Lattie Haw assisted pt with EKG. Will close encounter.

## 2018-02-23 LAB — HM PAP SMEAR: HM Pap smear: NORMAL

## 2018-03-27 ENCOUNTER — Institutional Professional Consult (permissible substitution): Payer: Federal, State, Local not specified - PPO | Admitting: Diagnostic Neuroimaging

## 2018-05-15 ENCOUNTER — Telehealth: Payer: Self-pay | Admitting: Pulmonary Disease

## 2018-05-15 MED ORDER — AZITHROMYCIN 250 MG PO TABS: ORAL_TABLET | ORAL | 0 refills | Status: DC

## 2018-05-15 NOTE — Telephone Encounter (Signed)
Last Thursday chills ache, runny nose , she has taken coraseden hbp  medication,she  is also now having a dry cough and diarrhea.  [facility] [address].   Doctor Lenna Gilford please advise Thank you.  Allergies as of 05/15/2018   No Known Allergies     Medication List        Accurate as of 05/15/18  4:26 PM. Always use your most recent med list.          amLODipine 5 MG tablet Commonly known as:  NORVASC Take 1 tablet (5 mg total) by mouth daily.   aspirin EC 81 MG tablet Take 81 mg by mouth daily.   cholecalciferol 1000 units tablet Commonly known as:  VITAMIN D Take 1,000 Units by mouth daily.   estrogens (conjugated) 0.3 MG tablet Commonly known as:  PREMARIN Take 0.3 mg by mouth See admin instructions. Take daily for 21 days then do not take for 7 days.   LORazepam 1 MG tablet Commonly known as:  ATIVAN Take 1 tablet (1 mg total) by mouth at bedtime.   losartan 100 MG tablet Commonly known as:  COZAAR Take 1 tablet (100 mg total) by mouth daily.   nebivolol 10 MG tablet Commonly known as:  BYSTOLIC TAKE 1 TABLET (10 MG TOTAL) BY MOUTH DAILY.   rosuvastatin 10 MG tablet Commonly known as:  CRESTOR Take 1 tablet (10 mg total) by mouth daily.   vitamin E 400 UNIT capsule Take 400 Units by mouth daily.

## 2018-05-15 NOTE — Telephone Encounter (Signed)
Per SN- Z pac and OTC Imodium, 1 every 4 hours as needed for watery stool.  Called and spoke with Patient.  Instructions given, understanding stated.  [facility] [address].  Nothing further at this time.

## 2018-05-23 ENCOUNTER — Other Ambulatory Visit: Payer: Self-pay | Admitting: *Deleted

## 2018-05-23 ENCOUNTER — Other Ambulatory Visit: Payer: Self-pay | Admitting: Pulmonary Disease

## 2018-05-23 MED ORDER — ROSUVASTATIN CALCIUM 10 MG PO TABS: 10.0000 mg | ORAL_TABLET | Freq: Every day | ORAL | 3 refills | Status: DC

## 2018-06-06 ENCOUNTER — Other Ambulatory Visit: Payer: Self-pay | Admitting: Pulmonary Disease

## 2018-06-21 ENCOUNTER — Ambulatory Visit (INDEPENDENT_AMBULATORY_CARE_PROVIDER_SITE_OTHER): Payer: Medicare Other

## 2018-06-21 DIAGNOSIS — Z23 Encounter for immunization: Secondary | ICD-10-CM

## 2018-08-20 ENCOUNTER — Other Ambulatory Visit: Payer: Self-pay | Admitting: Pulmonary Disease

## 2018-11-26 ENCOUNTER — Telehealth: Payer: Self-pay | Admitting: Pulmonary Disease

## 2018-11-26 DIAGNOSIS — Z0000 Encounter for general adult medical examination without abnormal findings: Secondary | ICD-10-CM

## 2018-11-26 NOTE — Telephone Encounter (Signed)
She will need ov so I can see her to prescribe something can see tomorrow.  Please put referral for PCP for her  Please contact office for sooner follow up if symptoms do not improve or worsen or seek emergency care

## 2018-11-26 NOTE — Telephone Encounter (Signed)
She needs to establish with new PCP , has she done this . Please send referral and assist her if not .   Is this just her pharm or other pharmacies, can we see if this can be checked. If just her pharmacy , please tell her to select another pharmacy for this rx.  Is it losartan 100 or could she do 50mg  and take 2 .  Let me know and we can adjust rx

## 2018-11-26 NOTE — Telephone Encounter (Signed)
Called and spoke with patient, she stated that the pharmacist told her that Losartan was on back order and they do not know when they will get any in. She is currently out of medication and will need something for her BP. This was last refilled in December of 2019. Patient is wanting to know if there is an alternative that she can take. Please advise, patient was a SN patient.

## 2018-11-26 NOTE — Telephone Encounter (Signed)
Called and spoke with pt asking if she had been able to get established with a new PCP and she stated that she had not. Pt stated that she was told she would be receiving a phone call if Dr. Lenna Gilford would be retiring and I stated to her that he has retired and due to this, we will need her to get established with a new PCP and I was going to place an order for this.  Also asked pt if the pharmacy told her that it was just that pharmacy or if it was other pharmacies as well that had the losartan on backorder and pt stated she was told that a lot of pharmacies have not been able to obtain the med. Asked pt if she knew if it was just the losartan 100mg  or if it was all strengths of the losartan and she was unsure.  I stated to pt that I was going to call pharmacy to clarify this. Stated to pt that we would call her once we got things taken care of with med. Pt expressed understanding.  Called pt's pharmacy and was told that all strengths of losartan as well as valsartan were on backorder and they are unsure when they will get them back.  Tammy, please advise if we can switch pt to an alternative BP med. Pt states she has been out of her losartan x5 days.

## 2018-11-26 NOTE — Addendum Note (Signed)
Addended by: Jannette Spanner on: 11/26/2018 03:23 PM   Modules accepted: Orders

## 2018-11-26 NOTE — Telephone Encounter (Addendum)
Called pt and advised message from the provider. Pt understood and verbalized understanding. Nothing further is needed.   Appt made tomorrow 11/27/2018 with TP at 430. Referral placed and nothing further is needed.

## 2018-11-27 ENCOUNTER — Encounter: Payer: Self-pay | Admitting: Adult Health

## 2018-11-27 ENCOUNTER — Other Ambulatory Visit: Payer: Self-pay

## 2018-11-27 ENCOUNTER — Ambulatory Visit (INDEPENDENT_AMBULATORY_CARE_PROVIDER_SITE_OTHER): Payer: Medicare Other | Admitting: Adult Health

## 2018-11-27 DIAGNOSIS — I10 Essential (primary) hypertension: Secondary | ICD-10-CM | POA: Diagnosis not present

## 2018-11-27 MED ORDER — IRBESARTAN 150 MG PO TABS: 150.0000 mg | ORAL_TABLET | Freq: Every day | ORAL | 2 refills | Status: DC

## 2018-11-27 NOTE — Progress Notes (Signed)
@Patient  ID: [name] [name], female    DOB: 21-May-1951, 68 y.o.   MRN: [id number]  Chief Complaint  Patient presents with  . Follow-up    HTN     Referring provider: Michele Mcalpine, MD  HPI: 68 year old female followed for hypertension, hyperlipidemia and GERD.  Former patient of Dr. Kriste Basque      11/27/2018 Follow up : HTN  Patient presents for follow up for hypertension . She is on Cozaar and bystolic . Says her pharmacy is out of cozaar is not able to get this for a while as it is on back order.  Says she is doing well with no known issues. No headache, chest pain, orthopnea or edema.  Needs an alternative for Cozaar . B/p today is 132/68.  We discussed healthy activity and diet  She walks on regular basis, and eats healthy choices.  She does need a referral to PCP as Dr. Kriste Basque has retired.    No Known Allergies  Immunization History  Administered Date(s) Administered  . Influenza Split 06/13/2011, 07/19/2012  . Influenza Whole 07/14/2009, 07/14/2010  . Influenza,inj,Quad PF,6+ Mos 08/13/2013, 07/28/2014, 08/25/2015, 05/26/2016, 08/03/2017, 06/21/2018  . Td 07/14/2010    Past Medical History:  Diagnosis Date  . Anxiety   . Colonic polyp   . FMD (facioscapulohumeral muscular dystrophy) (HCC)   . GERD (gastroesophageal reflux disease)   . Headache(784.0)   . Hypercholesterolemia   . Hypertension   . Idiopathic urticaria   . Varicose veins of lower extremities     Tobacco History: Social History   Tobacco Use  Smoking Status Never Smoker  Smokeless Tobacco Never Used   Counseling given: Not Answered   Outpatient Medications Prior to Visit  Medication Sig Dispense Refill  . amLODipine (NORVASC) 5 MG tablet Take 1 tablet (5 mg total) by mouth daily. 90 tablet 3  . aspirin EC 81 MG tablet Take 81 mg by mouth daily.    . cholecalciferol (VITAMIN D) 1000 units tablet Take 1,000 Units by mouth daily.    Marland Kitchen estradiol (ESTRACE) 0.5 MG tablet Take 0.5 mg by mouth  daily. Daily for 21 days, then do not take for 7 days    . LORazepam (ATIVAN) 1 MG tablet Take 1 tablet (1 mg total) by mouth at bedtime. 30 tablet 3  . losartan (COZAAR) 100 MG tablet Take 1 tablet (100 mg total) by mouth daily. 90 tablet 3  . nebivolol (BYSTOLIC) 10 MG tablet TAKE 1 TABLET BY MOUTH EVERY DAY 90 tablet 1  . rosuvastatin (CRESTOR) 10 MG tablet TAKE 1 TABLET BY MOUTH EVERY DAY 90 tablet 1  . vitamin E 400 UNIT capsule Take 400 Units by mouth daily.    Marland Kitchen azithromycin (ZITHROMAX) 250 MG tablet Take as directed (Patient not taking: Reported on 11/27/2018) 6 tablet 0  . estrogens, conjugated, (PREMARIN) 0.3 MG tablet Take 0.3 mg by mouth See admin instructions. Take daily for 21 days then do not take for 7 days.     No facility-administered medications prior to visit.      Review of Systems:   Constitutional:   No  weight loss, night sweats,  Fevers, chills, fatigue, or  lassitude.  HEENT:   No headaches,  Difficulty swallowing,  Tooth/dental problems, or  Sore throat,                No sneezing, itching, ear ache, nasal congestion, post nasal drip,   CV:  No chest pain,  Orthopnea, PND,  swelling in lower extremities, anasarca, dizziness, palpitations, syncope.   GI  No heartburn, indigestion, abdominal pain, nausea, vomiting, diarrhea, change in bowel habits, loss of appetite, bloody stools.   Resp: No shortness of breath with exertion or at rest.  No excess mucus, no productive cough,  No non-productive cough,  No coughing up of blood.  No change in color of mucus.  No wheezing.  No chest wall deformity  Skin: no rash or lesions.  GU: no dysuria, change in color of urine, no urgency or frequency.  No flank pain, no hematuria   MS:  No joint pain or swelling.  No decreased range of motion.  No back pain.    Physical Exam  BP 132/68 (BP Location: Left Arm, Cuff Size: Normal)   Pulse 73   Ht 5' 0.5" (1.537 m)   Wt 140 lb 6.4 oz (63.7 kg)   SpO2 97%   BMI 26.97 kg/m    GEN: A/Ox3; pleasant , NAD, well nourished    HEENT:  [place]/AT,  EACs-clear, TMs-wnl, NOSE-clear, THROAT-clear, no lesions, no postnasal drip or exudate noted.   NECK:  Supple w/ fair ROM; no JVD; normal carotid impulses w/o bruits; no thyromegaly or nodules palpated; no lymphadenopathy.    RESP  Clear  P & A; w/o, wheezes/ rales/ or rhonchi. no accessory muscle use, no dullness to percussion  CARD:  RRR, no m/r/g, no peripheral edema, pulses intact, no cyanosis or clubbing.  GI:   Soft & nt; nml bowel sounds; no organomegaly or masses detected.   Musco: Warm bil, no deformities or joint swelling noted.   Neuro: alert, no focal deficits noted.    Skin: Warm, no lesions or rashes    Lab Results:  CBC  No results found for: BNP  ProBNP No results found for: PROBNP  Imaging: No results found.    No flowsheet data found.  No results found for: NITRICOXIDE      Assessment & Plan:   No problem-specific Assessment & Plan notes found for this encounter.     Rubye Oaks, NP 11/27/2018

## 2018-11-27 NOTE — Patient Instructions (Addendum)
Begin Avapro 150mg  daily .  Continue on Bystolic 10mg  daily .  Low salt diet  Stay active as tolerated.  Refer to new Primary care provider.  Follow up As needed

## 2018-11-29 NOTE — Assessment & Plan Note (Signed)
Well controlled  Will need to change Cozaar as on back order  Refer to new PCP   Plan  Patient Instructions  Begin Avapro 150mg  daily .  Continue on Bystolic 10mg  daily .  Low salt diet  Stay active as tolerated.  Refer to new Primary care provider.  Follow up As needed

## 2018-12-13 ENCOUNTER — Encounter: Payer: Self-pay | Admitting: Family Medicine

## 2018-12-13 ENCOUNTER — Ambulatory Visit (INDEPENDENT_AMBULATORY_CARE_PROVIDER_SITE_OTHER): Payer: Medicare Other | Admitting: Family Medicine

## 2018-12-13 ENCOUNTER — Other Ambulatory Visit: Payer: Self-pay

## 2018-12-13 VITALS — BP 122/84 | HR 65 | Temp 98.4°F | Ht 63.0 in | Wt 140.4 lb

## 2018-12-13 DIAGNOSIS — E559 Vitamin D deficiency, unspecified: Secondary | ICD-10-CM

## 2018-12-13 DIAGNOSIS — Z23 Encounter for immunization: Secondary | ICD-10-CM | POA: Diagnosis not present

## 2018-12-13 DIAGNOSIS — N959 Unspecified menopausal and perimenopausal disorder: Secondary | ICD-10-CM

## 2018-12-13 DIAGNOSIS — E7800 Pure hypercholesterolemia, unspecified: Secondary | ICD-10-CM

## 2018-12-13 DIAGNOSIS — I10 Essential (primary) hypertension: Secondary | ICD-10-CM

## 2018-12-13 DIAGNOSIS — Z114 Encounter for screening for human immunodeficiency virus [HIV]: Secondary | ICD-10-CM

## 2018-12-13 DIAGNOSIS — I773 Arterial fibromuscular dysplasia: Secondary | ICD-10-CM | POA: Diagnosis not present

## 2018-12-13 DIAGNOSIS — Z0000 Encounter for general adult medical examination without abnormal findings: Secondary | ICD-10-CM | POA: Diagnosis not present

## 2018-12-13 LAB — CBC WITH DIFFERENTIAL/PLATELET
Basophils Absolute: 0 10*3/uL (ref 0.0–0.1)
Basophils Relative: 0.4 % (ref 0.0–3.0)
EOS ABS: 0.1 10*3/uL (ref 0.0–0.7)
EOS PCT: 1.6 % (ref 0.0–5.0)
HEMATOCRIT: 39.1 % (ref 36.0–46.0)
HEMOGLOBIN: 13.1 g/dL (ref 12.0–15.0)
LYMPHS ABS: 1.4 10*3/uL (ref 0.7–4.0)
Lymphocytes Relative: 24.3 % (ref 12.0–46.0)
MCHC: 33.5 g/dL (ref 30.0–36.0)
MCV: 90.6 fl (ref 78.0–100.0)
MONO ABS: 0.4 10*3/uL (ref 0.1–1.0)
Monocytes Relative: 7 % (ref 3.0–12.0)
Neutro Abs: 3.9 10*3/uL (ref 1.4–7.7)
Neutrophils Relative %: 66.7 % (ref 43.0–77.0)
Platelets: 267 10*3/uL (ref 150.0–400.0)
RBC: 4.31 Mil/uL (ref 3.87–5.11)
RDW: 13 % (ref 11.5–15.5)
WBC: 5.8 10*3/uL (ref 4.0–10.5)

## 2018-12-13 LAB — COMPREHENSIVE METABOLIC PANEL
ALBUMIN: 4.2 g/dL (ref 3.5–5.2)
ALT: 23 U/L (ref 0–35)
AST: 29 U/L (ref 0–37)
Alkaline Phosphatase: 76 U/L (ref 39–117)
BILIRUBIN TOTAL: 0.4 mg/dL (ref 0.2–1.2)
BUN: 16 mg/dL (ref 6–23)
CO2: 29 mEq/L (ref 19–32)
CREATININE: 0.91 mg/dL (ref 0.40–1.20)
Calcium: 9.3 mg/dL (ref 8.4–10.5)
Chloride: 103 mEq/L (ref 96–112)
GFR: 74.89 mL/min (ref >60.00)
GLUCOSE: 127 mg/dL — AB (ref 70–99)
Potassium: 3.6 mEq/L (ref 3.5–5.1)
SODIUM: 141 meq/L (ref 135–145)
Total Protein: 7 g/dL (ref 6.0–8.3)

## 2018-12-13 LAB — VITAMIN D 25 HYDROXY (VIT D DEFICIENCY, FRACTURES): VITD: 56.09 ng/mL (ref 30.00–100.00)

## 2018-12-13 LAB — MICROALBUMIN / CREATININE URINE RATIO
Creatinine,U: 209.7 mg/dL
Microalb Creat Ratio: 0.9 mg/g (ref 0.0–30.0)
Microalb, Ur: 2 mg/dL — ABNORMAL HIGH (ref 0.0–1.9)

## 2018-12-13 LAB — LIPID PANEL
CHOL/HDL RATIO: 2
Cholesterol: 152 mg/dL (ref 0–200)
HDL: 70.3 mg/dL (ref >39.00)
LDL CALC: 68 mg/dL (ref 0–99)
NONHDL: 81.97
Triglycerides: 71 mg/dL (ref 0.0–149.0)
VLDL: 14.2 mg/dL (ref 0.0–40.0)

## 2018-12-13 LAB — TSH: TSH: 1.74 u[IU]/mL (ref 0.35–4.50)

## 2018-12-13 NOTE — Patient Instructions (Addendum)
  Ms. Rhyne , Thank you for taking time to come for your Medicare Wellness Visit. I appreciate your ongoing commitment to your health goals. Please review the following plan we discussed and let me know if I can assist you in the future.   These are the goals we discussed: Goals   None     This is a list of the screening recommended for you and due dates:  Health Maintenance  Topic Date Due  . DEXA scan (bone density measurement)  03/10/2018  . Pneumonia vaccines (1 of 2 - PCV13) 03/10/2018  . Mammogram  02/24/2020  . Tetanus Vaccine  07/14/2020  . Pap Smear  02/23/2021  . Colon Cancer Screening  07/24/2021  . Flu Shot  Completed  .  Hepatitis C: One time screening is recommended by Center for Disease Control  (CDC) for  adults born from [birth year] through 1963.   Completed  . HIV Screening  Completed

## 2018-12-13 NOTE — Progress Notes (Signed)
Phone: [phone]  Subjective:  Patient presents today for their Welcome to Medicare Exam, establishing care and f/u of chronic issues.   Hypertension: Here for follow up of hypertension.  Currently on norvasc, avapro and bysotlic. Recently changed from cozaar to avapro as her cozaar was backordered. This was done on 11/27/18 by pulm NP.  Takes medication as prescribed and denies any side effects. Exercise includes walking dogs BID and elliptical. Weight has been stable. Denies any chest pain, headaches, shortness of breath, vision changes, swelling in lower extremities.   Hyperlipidemia: currently on 10mg  of crestor. Can not remember how long she has been on this. +FH of heart diease in her father. She does not smoke and does not have diabetes.   FH of dementia and pancreatic cancer in her mother. Renal failure in her father.  She has no kids.    Preventive Screening-Counseling & Management  Vision screen: 20/30 in left 20/20 in right and 20/20 bilateral.  No exam data present  Advanced directives: has at home.   Smoking Status: Never Smoker Second Hand Smoking status: No smokers in home  Risk Factors Regular exercise: walking and elliptical  Diet: does not follow any diet.   Fall Risk: None  Fall Risk  12/13/2018 02/04/2016 12/24/2015  Falls in the past year? 0 No No  Number falls in past yr: 0 - -  Injury with Fall? 0 - -   Opioid use history:  no long term opioids use  Cardiac risk factors:  advanced age (older than 4 for men, 12 for women) yes Hyperlipidemia yes No diabetes. no Family History: yes   Depression Screen None. PHQ2 0  Depression screen [facility] 2/9 12/13/2018 01/01/2013  Decreased Interest 0 0  Down, Depressed, Hopeless 0 0  PHQ - 2 Score 0 0    Activities of Daily Living Independent ADLs and IADLs   Hearing Difficulties: -patient declines  Cognitive Testing Mini cog: 4/5 No reported trouble.    Normal 3 word recall  List the Names of  Other Physician/Practitioners you currently use: No specialists.   Immunization History  Administered Date(s) Administered   Influenza Split 06/13/2011, 07/19/2012   Influenza Whole 07/14/2009, 07/14/2010   Influenza,inj,Quad PF,6+ Mos 08/13/2013, 07/28/2014, 08/25/2015, 05/26/2016, 08/03/2017, 06/21/2018   Influenza-Unspecified 06/19/2016   Pneumococcal Polysaccharide-23 12/13/2018   Td 07/14/2010   Required Immunizations needed today pneumovax 23   Screening tests- up to date Health Maintenance Due  Topic Date Due   DEXA SCAN  03/10/2018   PNA vac Low Risk Adult (1 of 2 - PCV13) 03/10/2018   Review of Systems  Constitutional: Negative for chills, fever and malaise/fatigue.  HENT: Negative for hearing loss and sore throat.   Eyes: Negative for blurred vision and double vision.  Respiratory: Negative for cough, shortness of breath and wheezing.   Cardiovascular: Negative for chest pain, palpitations and leg swelling.  Gastrointestinal: Negative for abdominal pain, blood in stool, nausea and vomiting.  Genitourinary: Negative for dysuria and hematuria.  Musculoskeletal: Negative for falls.  Skin: Negative for rash.  Neurological: Negative for dizziness and weakness.  Psychiatric/Behavioral: Negative for memory loss and suicidal ideas. The patient is not nervous/anxious and does not have insomnia.     The following were reviewed and entered/updated in epic: Past Medical History:  Diagnosis Date   Anxiety    Colonic polyp    FMD (facioscapulohumeral muscular dystrophy) ([place])    GERD (gastroesophageal reflux disease)    Headache(784.0)    Hypercholesterolemia  Hypertension    Idiopathic urticaria    Varicose veins of lower extremities    Patient Active Problem List   Diagnosis Date Noted   Renal fibromuscular hyperplasia ([place]) 03/23/2016   Renal artery stenosis ([place]) 02/11/2016   Chronic arterial ischemic stroke    Meningioma ([place]) 12/15/2015    Vitamin D deficiency 03/30/2015   COLONIC POLYPS 01/24/2008   VARICOSE VEINS, LOWER EXTREMITIES 01/24/2008   HYPERCHOLESTEROLEMIA 12/20/2007   Essential hypertension 12/20/2007   Past Surgical History:  Procedure Laterality Date   benign breast biopsy     bilateral breast implants     cataract right eye w/ lens implant     COLONOSCOPY     excision of large anal polyp     PERIPHERAL VASCULAR CATHETERIZATION N/A 02/11/2016   Procedure: Renal Angiography;  Surgeon: Conrad Melbourne, MD;  Location: [facility];  Service: Cardiovascular;  Laterality: N/A;   POLYPECTOMY     VAGINAL HYSTERECTOMY     varicose vein      Family History  Problem Relation Age of Onset   Hypertension Mother    Hyperlipidemia Mother    Diabetes Mother    Breast cancer Mother    Pulmonary embolism Mother    Deep vein thrombosis Mother    Dementia Father    Sickle cell anemia Cousin    Colon cancer Neg Hx    Esophageal cancer Neg Hx    Stomach cancer Neg Hx     Medications- reviewed and updated Current Outpatient Medications  Medication Sig Dispense Refill   amLODipine (NORVASC) 5 MG tablet Take 1 tablet (5 mg total) by mouth daily. 90 tablet 3   aspirin EC 81 MG tablet Take 81 mg by mouth daily.     cholecalciferol (VITAMIN D) 1000 units tablet Take 1,000 Units by mouth daily.     estradiol (ESTRACE) 0.5 MG tablet Take 0.5 mg by mouth daily. Daily for 21 days, then do not take for 7 days     irbesartan (AVAPRO) 150 MG tablet Take 1 tablet (150 mg total) by mouth daily. 30 tablet 2   LORazepam (ATIVAN) 1 MG tablet Take 1 tablet (1 mg total) by mouth at bedtime. 30 tablet 3   nebivolol (BYSTOLIC) 10 MG tablet TAKE 1 TABLET BY MOUTH EVERY DAY 90 tablet 1   rosuvastatin (CRESTOR) 10 MG tablet TAKE 1 TABLET BY MOUTH EVERY DAY 90 tablet 1   vitamin E 400 UNIT capsule Take 400 Units by mouth daily.     No current facility-administered medications for this visit.      Allergies-reviewed and updated No Known Allergies  Social History   Socioeconomic History   Marital status: Single    Spouse name: Not on file   Number of children: 0   Years of education: 12   Highest education level: Not on file  Occupational History    Comment: [employer]  Social Designer, fashion/clothing strain: Not on file   Food insecurity:    Worry: Not on file    Inability: Not on file   Transportation needs:    Medical: Not on file    Non-medical: Not on file  Tobacco Use   Smoking status: Never Smoker   Smokeless tobacco: Never Used  Substance and Sexual Activity   Alcohol use: No    Alcohol/week: 0.0 standard drinks   Drug use: No   Sexual activity: Not on file  Lifestyle   Physical activity:    Days  per week: Not on file    Minutes per session: Not on file   Stress: Not on file  Relationships   Social connections:    Talks on phone: Not on file    Gets together: Not on file    Attends religious service: Not on file    Active member of club or organization: Not on file    Attends meetings of clubs or organizations: Not on file    Relationship status: Not on file  Other Topics Concern   Not on file  Social History Narrative   Lives alone   No caffeine use    Objective: BP 122/84 (BP Location: Left Arm, Patient Position: Sitting, Cuff Size: Normal)    Pulse 65    Temp 98.4 F (36.9 C) (Oral)    Ht 5\' 3"  (1.6 m)    Wt 140 lb 6.4 oz (63.7 kg)    LMP  (LMP Unknown)    SpO2 98%    BMI 24.87 kg/m  Gen: NAD, resting comfortably HEENT: Mucous membranes are moist. Oropharynx normal. TM occluded with cerumen.  Neck: no thyromegaly. No bruits.  CV: RRR no murmurs rubs or gallops Lungs: CTAB no crackles, wheeze, rhonchi Abdomen: soft/nontender/nondistended/normal bowel sounds. No rebound or guarding.  Ext: no edema Skin: warm, dry Neuro: grossly normal, moves all extremities, PERRLA, DTR 2+   Ekg: NSR with rate of 60. Normal ekg.    Assessment/Plan:  Welcome to Medicare exam completed- discussed recommended screenings anddocumented any personalized health advice and referrals for preventive counseling. See AVS as well which was given to patient.   Status of chronic or acute concerns    2. Essential hypertension Blood pressure is to goal. Continue current anti-hypertensive medications. Refills not needed today. Routine lab work will be done today. Recommended routine exercise and healthy diet including DASH diet and mediterranean diet. Encouraged weight loss. F/u in 6 months.    3. HYPERCHOLESTEROLEMIA Labs, continue crestor/exericse.  - CBC with Differential/Platelet  4. Encounter for screening for HIV  - HIV Antibody (routine testing w rflx)  5. Need for Streptococcus pneumoniae vaccination  - Pneumococcal polysaccharide vaccine 23-valent greater than or equal to 2yo subcutaneous/IM  6. Menopausal and postmenopausal disorder  - DG Bone Density; Future  7. Vitamin D deficiency  - VITAMIN D 25 Hydroxy (Vit-D Deficiency, Fractures)  No future appointments. Return in about 6 months (around 06/15/2019) for htn.   Lab/Order associations: Welcome to Medicare preventive visit - Plan: EKG 12-Lead  Essential hypertension - Plan: Comprehensive metabolic panel, TSH, Microalbumin / creatinine urine ratio, CBC with Differential/Platelet  HYPERCHOLESTEROLEMIA - Plan: Lipid panel  Encounter for screening for HIV - Plan: HIV Antibody (routine testing w rflx)  Need for Streptococcus pneumoniae vaccination - Plan: Pneumococcal polysaccharide vaccine 23-valent greater than or equal to 2yo subcutaneous/IM  Menopausal and postmenopausal disorder - Plan: DG Bone Density  Vitamin D deficiency - Plan: VITAMIN D 25 Hydroxy (Vit-D Deficiency, Fractures)  Renal fibromuscular hyperplasia (HCC)  No orders of the defined types were placed in this encounter.   Return precautions advised. Orma Flaming, MD

## 2018-12-14 LAB — HIV ANTIBODY (ROUTINE TESTING W REFLEX): HIV 1&2 Ab, 4th Generation: NONREACTIVE

## 2018-12-17 ENCOUNTER — Other Ambulatory Visit: Payer: Self-pay

## 2018-12-17 MED ORDER — IRBESARTAN 150 MG PO TABS: 150.0000 mg | ORAL_TABLET | Freq: Every day | ORAL | 2 refills | Status: DC

## 2018-12-17 MED ORDER — NEBIVOLOL HCL 10 MG PO TABS: ORAL_TABLET | ORAL | 1 refills | Status: DC

## 2018-12-17 MED ORDER — AMLODIPINE BESYLATE 5 MG PO TABS: 5.0000 mg | ORAL_TABLET | Freq: Every day | ORAL | 3 refills | Status: DC

## 2018-12-17 MED ORDER — ROSUVASTATIN CALCIUM 10 MG PO TABS: 10.0000 mg | ORAL_TABLET | Freq: Every day | ORAL | 1 refills | Status: DC

## 2018-12-19 MED ORDER — LORAZEPAM 1 MG PO TABS: 1.0000 mg | ORAL_TABLET | Freq: Every day | ORAL | 0 refills | Status: DC

## 2018-12-19 NOTE — Progress Notes (Signed)
pmp website reviewed. Has not filled since 01/2018. Fills a 30 day supply looks like yearly.  30 day sent in for her.  [name] Flaming, MD [place]

## 2019-05-12 ENCOUNTER — Other Ambulatory Visit: Payer: Self-pay | Admitting: Family Medicine

## 2019-06-05 DIAGNOSIS — Z961 Presence of intraocular lens: Secondary | ICD-10-CM | POA: Diagnosis not present

## 2019-06-12 ENCOUNTER — Other Ambulatory Visit: Payer: Self-pay | Admitting: Family Medicine

## 2019-06-17 ENCOUNTER — Other Ambulatory Visit: Payer: Self-pay

## 2019-06-17 ENCOUNTER — Encounter: Payer: Self-pay | Admitting: Family Medicine

## 2019-06-17 ENCOUNTER — Ambulatory Visit (INDEPENDENT_AMBULATORY_CARE_PROVIDER_SITE_OTHER): Payer: Medicare Other | Admitting: Family Medicine

## 2019-06-17 VITALS — BP 110/78 | HR 57 | Temp 97.7°F | Ht 63.0 in | Wt 141.0 lb

## 2019-06-17 DIAGNOSIS — Z23 Encounter for immunization: Secondary | ICD-10-CM | POA: Diagnosis not present

## 2019-06-17 DIAGNOSIS — I10 Essential (primary) hypertension: Secondary | ICD-10-CM | POA: Diagnosis not present

## 2019-06-17 LAB — CBC WITH DIFFERENTIAL/PLATELET
Basophils Absolute: 0 10*3/uL (ref 0.0–0.1)
Basophils Relative: 0.5 % (ref 0.0–3.0)
Eosinophils Absolute: 0.1 10*3/uL (ref 0.0–0.7)
Eosinophils Relative: 1.7 % (ref 0.0–5.0)
HCT: 37.3 % (ref 36.0–46.0)
Hemoglobin: 12.5 g/dL (ref 12.0–15.0)
Lymphocytes Relative: 32 % (ref 12.0–46.0)
Lymphs Abs: 1.6 10*3/uL (ref 0.7–4.0)
MCHC: 33.6 g/dL (ref 30.0–36.0)
MCV: 89.8 fl (ref 78.0–100.0)
Monocytes Absolute: 0.5 10*3/uL (ref 0.1–1.0)
Monocytes Relative: 10.8 % (ref 3.0–12.0)
Neutro Abs: 2.7 10*3/uL (ref 1.4–7.7)
Neutrophils Relative %: 55 % (ref 43.0–77.0)
Platelets: 270 10*3/uL (ref 150.0–400.0)
RBC: 4.16 Mil/uL (ref 3.87–5.11)
RDW: 12.7 % (ref 11.5–15.5)
WBC: 4.9 10*3/uL (ref 4.0–10.5)

## 2019-06-17 LAB — COMPREHENSIVE METABOLIC PANEL
ALT: 13 U/L (ref 0–35)
AST: 20 U/L (ref 0–37)
Albumin: 4.1 g/dL (ref 3.5–5.2)
Alkaline Phosphatase: 82 U/L (ref 39–117)
BUN: 14 mg/dL (ref 6–23)
CO2: 30 mEq/L (ref 19–32)
Calcium: 9.6 mg/dL (ref 8.4–10.5)
Chloride: 103 mEq/L (ref 96–112)
Creatinine, Ser: 0.91 mg/dL (ref 0.40–1.20)
GFR: 74.78 mL/min (ref >60.00)
Glucose, Bld: 75 mg/dL (ref 70–99)
Potassium: 3.7 mEq/L (ref 3.5–5.1)
Sodium: 142 mEq/L (ref 135–145)
Total Bilirubin: 0.3 mg/dL (ref 0.2–1.2)
Total Protein: 7.1 g/dL (ref 6.0–8.3)

## 2019-06-17 MED ORDER — ROSUVASTATIN CALCIUM 10 MG PO TABS: 10.0000 mg | ORAL_TABLET | Freq: Every day | ORAL | 3 refills | Status: DC

## 2019-06-17 NOTE — Progress Notes (Signed)
Patient: [name] [name] MRN: [id number] DOB: 11-07-1950 PCP: Orland Mustard, MD     Subjective:  Chief Complaint  Patient presents with  . Hypertension    follow up    HPI: The patient is a 68 y.o. female who presents today for blood pressure follow up.   Hypertension: Here for follow up of hypertension.  Currently on norvasc, bystolic and avapro.Takes medication as prescribed and denies any side effects. Exercise includes none. She was walking her dog, but she stopped as he died in 06-Apr-2023. She is no longer using her elliptical.  Weight has been stable. Denies any chest pain, headaches, shortness of breath, vision changes, swelling in lower extremities.   -she stopped her crestor b/c she ran out. Never called in for a refill.   Review of Systems  Constitutional: Negative for chills, fatigue and fever.  HENT: Negative for dental problem, ear pain, hearing loss and trouble swallowing.   Eyes: Negative for visual disturbance.  Respiratory: Negative for cough, chest tightness and shortness of breath.   Cardiovascular: Negative for chest pain, palpitations and leg swelling.  Gastrointestinal: Negative for abdominal pain, blood in stool, diarrhea and nausea.  Endocrine: Negative for cold intolerance, polydipsia, polyphagia and polyuria.  Genitourinary: Negative for dysuria and hematuria.  Musculoskeletal: Negative for arthralgias.  Skin: Negative for rash.  Neurological: Negative for dizziness and headaches.  Psychiatric/Behavioral: Negative for dysphoric mood and sleep disturbance. The patient is not nervous/anxious.     Allergies Patient has No Known Allergies.  Past Medical History Patient  has a past medical history of Anxiety, Colonic polyp, FMD (facioscapulohumeral muscular dystrophy) (HCC), GERD (gastroesophageal reflux disease), Headache(784.0), Hypercholesterolemia, Hypertension, Idiopathic urticaria, and Varicose veins of lower extremities.  Surgical History Patient  has a  past surgical history that includes Vaginal hysterectomy; bilateral breast implants; benign breast biopsy; varicose vein; cataract right eye w/ lens implant; excision of large anal polyp; Colonoscopy; Polypectomy; and Cardiac catheterization (N/A, 02/11/2016).  Family History Pateint's family history includes Breast cancer in her mother; Deep vein thrombosis in her mother; Dementia in her father; Diabetes in her mother; Hyperlipidemia in her mother; Hypertension in her mother; Pulmonary embolism in her mother; Sickle cell anemia in her cousin.  Social History Patient  reports that she has never smoked. She has never used smokeless tobacco. She reports that she does not drink alcohol or use drugs.    Objective: Vitals:   06/17/19 1418  BP: 110/78  Pulse: (!) 57  Temp: 97.7 F (36.5 C)  TempSrc: Temporal  SpO2: 98%  Weight: 141 lb (64 kg)  Height: 5\' 3"  (1.6 m)    Body mass index is 24.98 kg/m.  Physical Exam Vitals signs reviewed.  Constitutional:      Appearance: Normal appearance.  HENT:     Head: Normocephalic and atraumatic.     Right Ear: Tympanic membrane, ear canal and external ear normal.     Left Ear: Tympanic membrane, ear canal and external ear normal.  Neck:     Musculoskeletal: Normal range of motion and neck supple.     Vascular: No carotid bruit.  Cardiovascular:     Rate and Rhythm: Normal rate and regular rhythm.     Pulses: Normal pulses.  Pulmonary:     Effort: Pulmonary effort is normal.  Abdominal:     General: Abdomen is flat. Bowel sounds are normal.     Palpations: Abdomen is soft.  Neurological:     General: No focal deficit present.  Mental Status: She is alert and oriented to person, place, and time.  Psychiatric:        Mood and Affect: Mood normal.        Behavior: Behavior normal.        Assessment/plan: 1. Essential hypertension Blood pressure is to goal. Continue current anti-hypertensive medications. Refills not needed. Routine  lab work will be done today. Recommended routine exercise and healthy diet including DASH diet and mediterranean diet. Encouraged weight loss. F/u in 6 months.   - Comprehensive metabolic panel - CBC with Differential/Platelet  2. Need for immunization against influenza  - Flu Vaccine QUAD High Dose(Fluad)  -never got DEXA, order again in 6 months when out of flu season and covid hopefully has regressed some.   Sent in crestor, discussed continue to take.    Return in about 6 months (around 12/15/2019) for htn/yearly check up. need medicare visit with courtney too .     Orland Mustard, MD [facility]  06/17/2019

## 2019-06-20 ENCOUNTER — Telehealth: Payer: Self-pay | Admitting: Physical Therapy

## 2019-06-20 NOTE — Telephone Encounter (Signed)
Copied from [place] [id number]. Topic: Quick Communication - See Telephone Encounter >> Jun 20, 2019  3:42 PM [place] wrote: CRM for notification. See Telephone encounter for: 06/20/19.pt called back as requested by voice mail to talk to Dr W nurse about her labs, pls return call at [phone]

## 2019-06-21 NOTE — Telephone Encounter (Signed)
Spoke to patient and reviewed all lab results.  She verbalized understanding.

## 2019-06-21 NOTE — Telephone Encounter (Signed)
Left message on patient's home answering machine requesting a call back, regarding lab results

## 2019-07-03 ENCOUNTER — Other Ambulatory Visit: Payer: Self-pay | Admitting: Family Medicine

## 2019-07-11 DIAGNOSIS — Z01419 Encounter for gynecological examination (general) (routine) without abnormal findings: Secondary | ICD-10-CM | POA: Diagnosis not present

## 2019-07-11 DIAGNOSIS — Z1231 Encounter for screening mammogram for malignant neoplasm of breast: Secondary | ICD-10-CM | POA: Diagnosis not present

## 2019-08-02 ENCOUNTER — Telehealth: Payer: Self-pay

## 2019-08-02 NOTE — Telephone Encounter (Signed)
Copied from [place] [id number]. Topic: Appointment Scheduling - Scheduling Inquiry for Clinic >> Aug 02, 2019 10:47 AM Percell Belt A wrote: Reason for CRM: pt called in and would like to be seen as soon as possible with Dr Rogers Blocker for a lump in her throat that she has had for a couple of week now . Please advise  Best number [phone]

## 2019-08-02 NOTE — Telephone Encounter (Signed)
Noted  

## 2019-08-05 ENCOUNTER — Encounter: Payer: Self-pay | Admitting: Family Medicine

## 2019-08-05 ENCOUNTER — Ambulatory Visit: Payer: Federal, State, Local not specified - PPO | Admitting: Family Medicine

## 2019-08-05 ENCOUNTER — Other Ambulatory Visit: Payer: Self-pay

## 2019-08-05 ENCOUNTER — Ambulatory Visit (INDEPENDENT_AMBULATORY_CARE_PROVIDER_SITE_OTHER): Payer: Medicare Other | Admitting: Family Medicine

## 2019-08-05 ENCOUNTER — Other Ambulatory Visit: Payer: Self-pay | Admitting: Family Medicine

## 2019-08-05 VITALS — BP 128/82 | HR 60 | Temp 97.1°F | Ht 63.0 in | Wt 138.8 lb

## 2019-08-05 DIAGNOSIS — R0989 Other specified symptoms and signs involving the circulatory and respiratory systems: Secondary | ICD-10-CM

## 2019-08-05 NOTE — Progress Notes (Signed)
Patient: [name] [name] MRN: [id number] DOB: 1951/02/06  PCP: Orland Mustard, MD     Subjective:  Chief Complaint  Patient presents with  . lump in throat    HPI: The patient is a 68 y.o. female who presents today for lump in throat. She noticed it about 2 weeks ago. She feels like she swallowed a pill and it didn't go down. She would swallow and drink and the feeling has not gone away. She feels like it's getting bigger. She has to really swallow to get past this lump. She is also coughing. She started to try to make herself gag, gargling with salt water and then she didn't feel anything anymore on late Saturday night. She doesn't feel anything right now, but the feeling comes and goes. She is able to drink liquids with no issues. She is able to eat food and swallow with no issues. She has no burning, nausea or vomiting associated with this. She has lost no weight in the last 4 weeks. She does not smoke or drink alcohol. She has no pressure on her throat and doesn't feel any pressure.  She almost cancelled her appointment today since she no longer has symptoms.   Review of Systems  Constitutional: Negative for chills and fever.  HENT: Positive for congestion, postnasal drip, rhinorrhea and trouble swallowing. Negative for sore throat.   Eyes: Negative for visual disturbance.  Respiratory: Negative for shortness of breath.   Cardiovascular: Negative for chest pain, palpitations and leg swelling.  Gastrointestinal: Negative for abdominal pain, diarrhea, nausea and vomiting.  Neurological: Negative for dizziness and headaches.    Allergies Patient has No Known Allergies.  Past Medical History Patient  has a past medical history of Anxiety, Colonic polyp, FMD (facioscapulohumeral muscular dystrophy) (HCC), GERD (gastroesophageal reflux disease), Headache(784.0), Hypercholesterolemia, Hypertension, Idiopathic urticaria, and Varicose veins of lower extremities.  Surgical History Patient   has a past surgical history that includes Vaginal hysterectomy; bilateral breast implants; benign breast biopsy; varicose vein; cataract right eye w/ lens implant; excision of large anal polyp; Colonoscopy; Polypectomy; and Cardiac catheterization (N/A, 02/11/2016).  Family History Pateint's family history includes Breast cancer in her mother; Deep vein thrombosis in her mother; Dementia in her father; Diabetes in her mother; Hyperlipidemia in her mother; Hypertension in her mother; Pulmonary embolism in her mother; Sickle cell anemia in her cousin.  Social History Patient  reports that she has never smoked. She has never used smokeless tobacco. She reports that she does not drink alcohol or use drugs.    Objective: Vitals:   08/05/19 1325  BP: 128/82  Pulse: 60  Temp: (!) 97.1 F (36.2 C)  TempSrc: Skin  SpO2: 96%  Weight: 138 lb 12.8 oz (63 kg)  Height: 5\' 3"  (1.6 m)    Body mass index is 24.59 kg/m.  Physical Exam Vitals signs reviewed.  Constitutional:      Appearance: Normal appearance. She is normal weight.  HENT:     Head: Normocephalic and atraumatic.     Right Ear: Tympanic membrane, ear canal and external ear normal.     Left Ear: Tympanic membrane, ear canal and external ear normal.     Nose: Nose normal.     Mouth/Throat:     Mouth: Mucous membranes are moist.     Comments: No abnormality on posterior pharynx  Neck:     Musculoskeletal: Normal range of motion and neck supple. No muscular tenderness.     Vascular: No carotid bruit.  Comments: Thyroid normal  Cardiovascular:     Rate and Rhythm: Normal rate and regular rhythm.     Heart sounds: Normal heart sounds.  Pulmonary:     Effort: Pulmonary effort is normal.     Breath sounds: Normal breath sounds.  Abdominal:     General: Abdomen is flat. Bowel sounds are normal.     Palpations: Abdomen is soft.  Lymphadenopathy:     Cervical: No cervical adenopathy.  Neurological:     Mental Status: She is  alert.        Assessment/plan: 1. Globus sensation Likely had a piece of food that became lodged in her right valleculae. Feeling is gone and she can swallow both liquids and solids. She is asymptomatic.  Discussed some swallowing techniques to try at home. If feeling returns she is to let me know so we can proceed with imaging vs. Consult.     Return if symptoms worsen or fail to improve.   Orland Mustard, MD [facility]   08/05/2019

## 2019-08-05 NOTE — Patient Instructions (Signed)
I feel like you got a piece of food stuck in your valleculae which is a little pocket that is in your throat before your larynx. I want you to turn your head and swallow liquids on both sides to see if this helps clear it out.   If continues to feel like something is stuck or if have symptoms again let me know and we can order a swallow study for you.   Good to see you!  dr. Rogers Blocker

## 2019-10-16 ENCOUNTER — Encounter: Payer: Self-pay | Admitting: Family Medicine

## 2019-12-16 ENCOUNTER — Ambulatory Visit (INDEPENDENT_AMBULATORY_CARE_PROVIDER_SITE_OTHER): Payer: Medicare Other | Admitting: Family Medicine

## 2019-12-16 ENCOUNTER — Other Ambulatory Visit: Payer: Self-pay

## 2019-12-16 ENCOUNTER — Encounter: Payer: Self-pay | Admitting: Family Medicine

## 2019-12-16 ENCOUNTER — Ambulatory Visit (INDEPENDENT_AMBULATORY_CARE_PROVIDER_SITE_OTHER): Payer: Medicare Other

## 2019-12-16 VITALS — BP 122/62 | HR 59 | Temp 97.7°F | Ht 63.0 in | Wt 140.7 lb

## 2019-12-16 VITALS — BP 122/62 | HR 59 | Temp 97.9°F | Ht 63.0 in | Wt 140.6 lb

## 2019-12-16 DIAGNOSIS — N959 Unspecified menopausal and perimenopausal disorder: Secondary | ICD-10-CM | POA: Diagnosis not present

## 2019-12-16 DIAGNOSIS — L659 Nonscarring hair loss, unspecified: Secondary | ICD-10-CM

## 2019-12-16 DIAGNOSIS — Z0000 Encounter for general adult medical examination without abnormal findings: Secondary | ICD-10-CM

## 2019-12-16 DIAGNOSIS — E7800 Pure hypercholesterolemia, unspecified: Secondary | ICD-10-CM

## 2019-12-16 DIAGNOSIS — I10 Essential (primary) hypertension: Secondary | ICD-10-CM

## 2019-12-16 LAB — LIPID PANEL
Cholesterol: 135 mg/dL (ref 0–200)
HDL: 63.5 mg/dL (ref >39.00)
LDL Cholesterol: 57 mg/dL (ref 0–99)
NonHDL: 71.1
Total CHOL/HDL Ratio: 2
Triglycerides: 72 mg/dL (ref 0.0–149.0)
VLDL: 14.4 mg/dL (ref 0.0–40.0)

## 2019-12-16 LAB — CBC WITH DIFFERENTIAL/PLATELET
Basophils Absolute: 0 10*3/uL (ref 0.0–0.1)
Basophils Relative: 0.6 % (ref 0.0–3.0)
Eosinophils Absolute: 0.1 10*3/uL (ref 0.0–0.7)
Eosinophils Relative: 1.8 % (ref 0.0–5.0)
HCT: 37.6 % (ref 36.0–46.0)
Hemoglobin: 12.4 g/dL (ref 12.0–15.0)
Lymphocytes Relative: 28.5 % (ref 12.0–46.0)
Lymphs Abs: 1.6 10*3/uL (ref 0.7–4.0)
MCHC: 33.1 g/dL (ref 30.0–36.0)
MCV: 90.2 fl (ref 78.0–100.0)
Monocytes Absolute: 0.6 10*3/uL (ref 0.1–1.0)
Monocytes Relative: 10.3 % (ref 3.0–12.0)
Neutro Abs: 3.2 10*3/uL (ref 1.4–7.7)
Neutrophils Relative %: 58.8 % (ref 43.0–77.0)
Platelets: 268 10*3/uL (ref 150.0–400.0)
RBC: 4.16 Mil/uL (ref 3.87–5.11)
RDW: 13.2 % (ref 11.5–15.5)
WBC: 5.4 10*3/uL (ref 4.0–10.5)

## 2019-12-16 LAB — COMPREHENSIVE METABOLIC PANEL
ALT: 19 U/L (ref 0–35)
AST: 23 U/L (ref 0–37)
Albumin: 4.2 g/dL (ref 3.5–5.2)
Alkaline Phosphatase: 76 U/L (ref 39–117)
BUN: 18 mg/dL (ref 6–23)
CO2: 27 mEq/L (ref 19–32)
Calcium: 9.4 mg/dL (ref 8.4–10.5)
Chloride: 103 mEq/L (ref 96–112)
Creatinine, Ser: 1.1 mg/dL (ref 0.40–1.20)
GFR: 59.99 mL/min — ABNORMAL LOW (ref >60.00)
Glucose, Bld: 131 mg/dL — ABNORMAL HIGH (ref 70–99)
Potassium: 3.4 mEq/L — ABNORMAL LOW (ref 3.5–5.1)
Sodium: 139 mEq/L (ref 135–145)
Total Bilirubin: 0.3 mg/dL (ref 0.2–1.2)
Total Protein: 7.3 g/dL (ref 6.0–8.3)

## 2019-12-16 LAB — TSH: TSH: 2.25 u[IU]/mL (ref 0.35–4.50)

## 2019-12-16 LAB — MICROALBUMIN / CREATININE URINE RATIO
Creatinine,U: 286.9 mg/dL
Microalb Creat Ratio: 1.6 mg/g (ref 0.0–30.0)
Microalb, Ur: 4.4 mg/dL — ABNORMAL HIGH (ref 0.0–1.9)

## 2019-12-16 NOTE — Progress Notes (Signed)
This visit is being conducted via phone call due to the COVID-19 pandemic. This patient has given me verbal consent via phone to conduct this visit, patient states they are participating from their home address. Some vital signs may be absent or patient reported.   Patient identification: identified by name, DOB, and current address.  Location provider: [facility], Office Persons participating in the virtual visit: Denman George LPN, patient, and Dr. Orma Flaming   Subjective:   [name] [name] is a 69 y.o. female who presents for an Initial Medicare Annual Wellness Visit.  Review of Systems     Cardiac Risk Factors include: advanced age (>31men, >82 women);hypertension;dyslipidemia    Objective:    Today's Vitals   12/16/19 1457  BP: 122/62  Pulse: (!) 59  Temp: 97.7 F (36.5 C)  TempSrc: Temporal  SpO2: 96%  Weight: 140 lb 10.5 oz (63.8 kg)  Height: 5\' 3"  (1.6 m)   Body mass index is 24.92 kg/m.  Advanced Directives 12/16/2019 02/22/2016 02/11/2016 12/24/2015 12/15/2015  Does Patient Have a Medical Advance Directive? Yes No No No No  Type of Advance Directive Living will;Healthcare Power of Attorney - - - -  Does patient want to make changes to medical advance directive? No - Patient declined - - - -  Copy of [place] in Chart? No - copy requested - - - -  Would patient like information on creating a medical advance directive? - - No - patient declined information - No - patient declined information    Current Medications (verified) Outpatient Encounter Medications as of 12/16/2019  Medication Sig  . amLODipine (NORVASC) 5 MG tablet Take 1 tablet (5 mg total) by mouth daily.  Marland Kitchen aspirin EC 81 MG tablet Take 81 mg by mouth daily.  . cholecalciferol (VITAMIN D) 1000 units tablet Take 1,000 Units by mouth daily.  Marland Kitchen estradiol (ESTRACE) 0.5 MG tablet Take 0.5 mg by mouth daily. Daily for 21 days, then do not take for 7 days  . irbesartan (AVAPRO) 150 MG  tablet TAKE 1 TABLET BY MOUTH EVERY DAY  . LORazepam (ATIVAN) 1 MG tablet Take 1 tablet (1 mg total) by mouth at bedtime.  . nebivolol (BYSTOLIC) 10 MG tablet TAKE 1 TABLET BY MOUTH EVERY DAY  . rosuvastatin (CRESTOR) 10 MG tablet Take 1 tablet (10 mg total) by mouth daily.  . vitamin E 400 UNIT capsule Take 400 Units by mouth daily.   No facility-administered encounter medications on file as of 12/16/2019.    Allergies (verified) Patient has no known allergies.   History: Past Medical History:  Diagnosis Date  . Anxiety   . Colonic polyp   . FMD (facioscapulohumeral muscular dystrophy) ([place])   . GERD (gastroesophageal reflux disease)   . Headache(784.0)   . Hypercholesterolemia   . Hypertension   . Idiopathic urticaria   . Varicose veins of lower extremities    Past Surgical History:  Procedure Laterality Date  . benign breast biopsy    . bilateral breast implants    . cataract right eye w/ lens implant    . COLONOSCOPY    . excision of large anal polyp    . PERIPHERAL VASCULAR CATHETERIZATION N/A 02/11/2016   Procedure: Renal Angiography;  Surgeon: Conrad Frazier Park, MD;  Location: [facility];  Service: Cardiovascular;  Laterality: N/A;  . POLYPECTOMY    . VAGINAL HYSTERECTOMY    . varicose vein     Family History  Problem  Relation Age of Onset  . Hypertension Mother   . Hyperlipidemia Mother   . Diabetes Mother   . Breast cancer Mother   . Pulmonary embolism Mother   . Deep vein thrombosis Mother   . Dementia Father   . Sickle cell anemia Cousin   . Colon cancer Neg Hx   . Esophageal cancer Neg Hx   . Stomach cancer Neg Hx    Social History   Socioeconomic History  . Marital status: Single    Spouse name: Not on file  . Number of children: 0  . Years of education: 21  . Highest education level: Not on file  Occupational History    Comment: [employer]  Tobacco Use  . Smoking status: Never Smoker  . Smokeless tobacco: Never Used  Substance and Sexual  Activity  . Alcohol use: No    Alcohol/week: 0.0 standard drinks  . Drug use: No  . Sexual activity: Not on file  Other Topics Concern  . Not on file  Social History Narrative   Lives alone   No caffeine use   Social Determinants of Health   Financial Resource Strain:   . Difficulty of Paying Living Expenses:   Food Insecurity:   . Worried About Charity fundraiser in the Last Year:   . Arboriculturist in the Last Year:   Transportation Needs:   . Film/video editor (Medical):   Marland Kitchen Lack of Transportation (Non-Medical):   Physical Activity:   . Days of Exercise per Week:   . Minutes of Exercise per Session:   Stress:   . Feeling of Stress :   Social Connections:   . Frequency of Communication with Friends and Family:   . Frequency of Social Gatherings with Friends and Family:   . Attends Religious Services:   . Active Member of Clubs or Organizations:   . Attends Archivist Meetings:   Marland Kitchen Marital Status:     Tobacco Counseling Counseling given: Not Answered   Clinical Intake:  Pre-visit preparation completed: No  Pain : No/denies pain  Diabetes: No  How often do you need to have someone help you when you read instructions, pamphlets, or other written materials from your doctor or pharmacy?: 1 - Never  Interpreter Needed?: No  Information entered by :: Denman George LPN   Activities of Daily Living In your present state of health, do you have any difficulty performing the following activities: 12/16/2019  Hearing? N  Vision? N  Difficulty concentrating or making decisions? N  Walking or climbing stairs? N  Dressing or bathing? N  Doing errands, shopping? N  Preparing Food and eating ? N  Using the Toilet? N  In the past six months, have you accidently leaked urine? N  Do you have problems with loss of bowel control? N  Managing your Medications? N  Managing your Finances? N  Housekeeping or managing your Housekeeping? N  Some recent data  might be hidden     Immunizations and Health Maintenance Immunization History  Administered Date(s) Administered  . Fluad Quad(high Dose 65+) 06/17/2019  . Influenza Split 06/13/2011, 07/19/2012  . Influenza Whole 07/14/2009, 07/14/2010  . Influenza,inj,Quad PF,6+ Mos 08/13/2013, 07/28/2014, 08/25/2015, 05/26/2016, 08/03/2017, 06/21/2018  . Influenza-Unspecified 06/19/2016  . PFIZER SARS-COV-2 Vaccination 11/27/2019  . Pneumococcal Polysaccharide-23 12/13/2018  . Td 07/14/2010   Health Maintenance Due  Topic Date Due  . DEXA SCAN  Never done  . PNA vac Low Risk Adult (2  of 2 - PCV13) 12/13/2019    Patient Care Team: Orma Flaming, MD as PCP - General (Family Medicine) Bobbye Charleston, MD as Consulting Physician (Obstetrics and Gynecology)  Indicate any recent Medical Services you may have received from other than [facility] providers in the past year (date may be approximate).     Assessment:   This is a routine wellness examination for Daisey.  Hearing/Vision screen No exam data present  Dietary issues and exercise activities discussed: Current Exercise Habits: Home exercise routine, Type of exercise: walking, Time (Minutes): 30, Frequency (Times/Week): 4, Weekly Exercise (Minutes/Week): 120, Intensity: Mild  Goals   None    Depression Screen PHQ 2/9 Scores 12/16/2019 12/16/2019 12/13/2018 01/01/2013  PHQ - 2 Score 0 0 0 0  PHQ- 9 Score 0 0 - -    Fall Risk Fall Risk  12/16/2019 12/16/2019 12/13/2018 02/04/2016 12/24/2015  Falls in the past year? 0 0 0 No No  Number falls in past yr: 0 - 0 - -  Injury with Fall? 0 - 0 - -  Follow up Falls evaluation completed;Education provided;Falls prevention discussed - - - -    Is the patient's home free of loose throw rugs in walkways, pet beds, electrical cords, etc?   yes      Grab bars in the bathroom? yes      Handrails on the stairs?   yes      Adequate lighting?   yes  Cognitive Function:   6CIT Screen 12/16/2019  What  Year? 0 points  What month? 0 points  What time? 0 points  Count back from 20 0 points  Months in reverse 0 points  Repeat phrase 0 points  Total Score 0    Screening Tests Health Maintenance  Topic Date Due  . DEXA SCAN  Never done  . PNA vac Low Risk Adult (2 of 2 - PCV13) 12/13/2019  . MAMMOGRAM  02/24/2020  . TETANUS/TDAP  07/14/2020  . COLONOSCOPY  07/24/2021  . INFLUENZA VACCINE  Completed  . Hepatitis C Screening  Completed    Qualifies for Shingles Vaccine? Discussed and patient will check with pharmacy for coverage.  Patient education handout provided   Cancer Screenings: Lung: Low Dose CT Chest recommended if Age 50-80 years, 30 pack-year currently smoking OR have quit w/in 15years. Patient does not qualify. Breast: Up to date on Mammogram? Yes   Up to date of Bone Density/Dexa? No; ordered today  Colorectal: colonoscopy 07/25/11  Plan:  I have personally reviewed and addressed the Medicare Annual Wellness questionnaire and have noted the following in the patient's chart:  A. Medical and social history B. Use of alcohol, tobacco or illicit drugs  C. Current medications and supplements D. Functional ability and status E.  Nutritional status F.  Physical activity G. Advance directives H. List of other physicians I.  Hospitalizations, surgeries, and ER visits in previous 12 months J.  [place] such as hearing and vision if needed, cognitive and depression L. Referrals, records requested, and appointments- request notes from gyn   In addition, I have reviewed and discussed with patient certain preventive protocols, quality metrics, and best practice recommendations. A written personalized care plan for preventive services as well as general preventive health recommendations were provided to patient.   Signed,  Denman George, LPN  Nurse Health Advisor   Nurse Notes: no additional

## 2019-12-16 NOTE — Patient Instructions (Signed)
Dexa: bone scan ordered, they will call you with this.   Will check all of your labs today. Continue all medication.   Checking thyroid for hair loss. Could be a thing called telogen effluvium, but lets make sure it's nothing else.   You look great! Keep up the good work!   Dr. Rogers Blocker

## 2019-12-16 NOTE — Progress Notes (Deleted)
Patient: [name] [name] MRN: [id number] DOB: 12-10-1950 PCP: Orma Flaming, MD     Subjective:  Chief Complaint  Patient presents with  . Annual Exam  . Hypertension    HPI: The patient is a 69 y.o. female who presents today for annual exam. She denies any changes to past medical history. There have been no recent hospitalizations. They are not following a well balanced diet and exercise plan. Weight has been increasing steadily. No complaints today.   Immunization History  Administered Date(s) Administered  . Fluad Quad(high Dose 65+) 06/17/2019  . Influenza Split 06/13/2011, 07/19/2012  . Influenza Whole 07/14/2009, 07/14/2010  . Influenza,inj,Quad PF,6+ Mos 08/13/2013, 07/28/2014, 08/25/2015, 05/26/2016, 08/03/2017, 06/21/2018  . Influenza-Unspecified 06/19/2016  . Pneumococcal Polysaccharide-23 12/13/2018  . Td 07/14/2010   Colonoscopy: Mammogram:  Pap smear:  PSA:   Review of Systems  Constitutional: Negative for chills, fatigue and fever.  HENT: Negative for congestion, sneezing and sore throat.   Respiratory: Negative for cough, shortness of breath and wheezing.   Cardiovascular: Negative for chest pain and palpitations.  Genitourinary: Negative for frequency, pelvic pain and urgency.  Neurological: Negative for dizziness, light-headedness and headaches.    Allergies Patient has No Known Allergies.  Past Medical History Patient  has a past medical history of Anxiety, Colonic polyp, FMD (facioscapulohumeral muscular dystrophy) ([place]), GERD (gastroesophageal reflux disease), Headache(784.0), Hypercholesterolemia, Hypertension, Idiopathic urticaria, and Varicose veins of lower extremities.  Surgical History Patient  has a past surgical history that includes Vaginal hysterectomy; bilateral breast implants; benign breast biopsy; varicose vein; cataract right eye w/ lens implant; excision of large anal polyp; Colonoscopy; Polypectomy; and Cardiac catheterization (N/A,  02/11/2016).  Family History Pateint's family history includes Breast cancer in her mother; Deep vein thrombosis in her mother; Dementia in her father; Diabetes in her mother; Hyperlipidemia in her mother; Hypertension in her mother; Pulmonary embolism in her mother; Sickle cell anemia in her cousin.  Social History Patient  reports that she has never smoked. She has never used smokeless tobacco. She reports that she does not drink alcohol or use drugs.    Objective: Vitals:   12/16/19 1314  BP: 122/62  Pulse: (!) 59  Temp: 97.9 F (36.6 C)  TempSrc: Temporal  SpO2: 96%  Weight: 140 lb 9.6 oz (63.8 kg)  Height: 5\' 3"  (1.6 m)    Body mass index is 24.91 kg/m.  Physical Exam     Assessment/plan:   No problem-specific Assessment & Plan notes found for this encounter.    No follow-ups on file.     Orma Flaming, MD [place]  12/16/2019

## 2019-12-16 NOTE — Patient Instructions (Signed)
[name] [name] , Thank you for taking time to come for your Medicare Wellness Visit. I appreciate your ongoing commitment to your health goals. Please review the following plan we discussed and let me know if I can assist you in the future.   Screening recommendations/referrals: Colorectal Screening: up to date; last colonoscopy 07/25/11 Mammogram: up to date Bone Density: ordered at visit   Vision and Dental Exams: Recommended annual ophthalmology exams for early detection of glaucoma and other disorders of the eye Recommended annual dental exams for proper oral hygiene  Vaccinations: Influenza vaccine: completed 06/17/19 Pneumococcal vaccine: up to date; last 12/13/18 (Prevnar recommended for next visit)  Tdap vaccine: up to date; last 07/14/10 Shingles vaccine: You may receive this vaccine at your local pharmacy. (see handout)   Advanced directives: Please bring a copy of your POA (Power of Attorney) and/or Living Will to your next appointment.  Goals: Recommend to drink at least 6-8 8oz glasses of water per day and consume a balanced diet rich in fresh fruits and vegetables.   Next appointment: Please schedule your Annual Wellness Visit with your Nurse Health Advisor in one year.  Preventive Care 67 Years and Older, Female Preventive care refers to lifestyle choices and visits with your health care provider that can promote health and wellness. What does preventive care include?  A yearly physical exam. This is also called an annual well check.  Dental exams once or twice a year.  Routine eye exams. Ask your health care provider how often you should have your eyes checked.  Personal lifestyle choices, including:  Daily care of your teeth and gums.  Regular physical activity.  Eating a healthy diet.  Avoiding tobacco and drug use.  Limiting alcohol use.  Practicing safe sex.  Taking low-dose aspirin every day if recommended by your health care provider.  Taking vitamin  and mineral supplements as recommended by your health care provider. What happens during an annual well check? The services and screenings done by your health care provider during your annual well check will depend on your age, overall health, lifestyle risk factors, and family history of disease. Counseling  Your health care provider may ask you questions about your:  Alcohol use.  Tobacco use.  Drug use.  Emotional well-being.  Home and relationship well-being.  Sexual activity.  Eating habits.  History of falls.  Memory and ability to understand (cognition).  Work and work Statistician.  Reproductive health. Screening  You may have the following tests or measurements:  Height, weight, and BMI.  Blood pressure.  Lipid and cholesterol levels. These may be checked every 5 years, or more frequently if you are over 28 years old.  Skin check.  Lung cancer screening. You may have this screening every year starting at age 71 if you have a 30-pack-year history of smoking and currently smoke or have quit within the past 15 years.  Fecal occult blood test (FOBT) of the stool. You may have this test every year starting at age 53.  Flexible sigmoidoscopy or colonoscopy. You may have a sigmoidoscopy every 5 years or a colonoscopy every 10 years starting at age 50.  Hepatitis C blood test.  Hepatitis B blood test.  Sexually transmitted disease (STD) testing.  Diabetes screening. This is done by checking your blood sugar (glucose) after you have not eaten for a while (fasting). You may have this done every 1-3 years.  Bone density scan. This is done to screen for osteoporosis. You may have this  done starting at age 44.  Mammogram. This may be done every 1-2 years. Talk to your health care provider about how often you should have regular mammograms. Talk with your health care provider about your test results, treatment options, and if necessary, the need for more  tests. Vaccines  Your health care provider may recommend certain vaccines, such as:  Influenza vaccine. This is recommended every year.  Tetanus, diphtheria, and acellular pertussis (Tdap, Td) vaccine. You may need a Td booster every 10 years.  Zoster vaccine. You may need this after age 71.  Pneumococcal 13-valent conjugate (PCV13) vaccine. One dose is recommended after age 54.  Pneumococcal polysaccharide (PPSV23) vaccine. One dose is recommended after age 9. Talk to your health care provider about which screenings and vaccines you need and how often you need them. This information is not intended to replace advice given to you by your health care provider. Make sure you discuss any questions you have with your health care provider. Document Released: 10/02/2015 Document Revised: 05/25/2016 Document Reviewed: 07/07/2015 Elsevier Interactive Patient Education  2017 [employer] Prevention in the Home Falls can cause injuries. They can happen to people of all ages. There are many things you can do to make your home safe and to help prevent falls. What can I do on the outside of my home?  Regularly fix the edges of walkways and driveways and fix any cracks.  Remove anything that might make you trip as you walk through a door, such as a raised step or threshold.  Trim any bushes or trees on the path to your home.  Use bright outdoor lighting.  Clear any walking paths of anything that might make someone trip, such as rocks or tools.  Regularly check to see if handrails are loose or broken. Make sure that both sides of any steps have handrails.  Any raised decks and porches should have guardrails on the edges.  Have any leaves, snow, or ice cleared regularly.  Use sand or salt on walking paths during winter.  Clean up any spills in your garage right away. This includes oil or grease spills. What can I do in the bathroom?  Use night lights.  Install grab bars by the  toilet and in the tub and shower. Do not use towel bars as grab bars.  Use non-skid mats or decals in the tub or shower.  If you need to sit down in the shower, use a plastic, non-slip stool.  Keep the floor dry. Clean up any water that spills on the floor as soon as it happens.  Remove soap buildup in the tub or shower regularly.  Attach bath mats securely with double-sided non-slip rug tape.  Do not have throw rugs and other things on the floor that can make you trip. What can I do in the bedroom?  Use night lights.  Make sure that you have a light by your bed that is easy to reach.  Do not use any sheets or blankets that are too big for your bed. They should not hang down onto the floor.  Have a firm chair that has side arms. You can use this for support while you get dressed.  Do not have throw rugs and other things on the floor that can make you trip. What can I do in the kitchen?  Clean up any spills right away.  Avoid walking on wet floors.  Keep items that you use a lot in easy-to-reach places.  If you need to reach something above you, use a strong step stool that has a grab bar.  Keep electrical cords out of the way.  Do not use floor polish or wax that makes floors slippery. If you must use wax, use non-skid floor wax.  Do not have throw rugs and other things on the floor that can make you trip. What can I do with my stairs?  Do not leave any items on the stairs.  Make sure that there are handrails on both sides of the stairs and use them. Fix handrails that are broken or loose. Make sure that handrails are as long as the stairways.  Check any carpeting to make sure that it is firmly attached to the stairs. Fix any carpet that is loose or worn.  Avoid having throw rugs at the top or bottom of the stairs. If you do have throw rugs, attach them to the floor with carpet tape.  Make sure that you have a light switch at the top of the stairs and the bottom of  the stairs. If you do not have them, ask someone to add them for you. What else can I do to help prevent falls?  Wear shoes that:  Do not have high heels.  Have rubber bottoms.  Are comfortable and fit you well.  Are closed at the toe. Do not wear sandals.  If you use a stepladder:  Make sure that it is fully opened. Do not climb a closed stepladder.  Make sure that both sides of the stepladder are locked into place.  Ask someone to hold it for you, if possible.  Clearly mark and make sure that you can see:  Any grab bars or handrails.  First and last steps.  Where the edge of each step is.  Use tools that help you move around (mobility aids) if they are needed. These include:  Canes.  Walkers.  Scooters.  Crutches.  Turn on the lights when you go into a dark area. Replace any light bulbs as soon as they burn out.  Set up your furniture so you have a clear path. Avoid moving your furniture around.  If any of your floors are uneven, fix them.  If there are any pets around you, be aware of where they are.  Review your medicines with your doctor. Some medicines can make you feel dizzy. This can increase your chance of falling. Ask your doctor what other things that you can do to help prevent falls. This information is not intended to replace advice given to you by your health care provider. Make sure you discuss any questions you have with your health care provider. Document Released: 07/02/2009 Document Revised: 02/11/2016 Document Reviewed: 10/10/2014 Elsevier Interactive Patient Education  2017 [employer].

## 2019-12-16 NOTE — Progress Notes (Signed)
Patient: [name] [name] MRN: [id number] DOB: 26-Feb-1951 PCP: Orland Mustard, MD     Subjective:  Chief Complaint  Patient presents with  . Hypertension  . Hyperlipidemia  . bone scan    HPI: The patient is a 69 y.o. female who presents today for   Hypertension: Here for follow up of hypertension.  Currently on norvasc 5mg , avapro 150mg  and bystolic 10mg . Takes medication as prescribed and denies any side effects. Exercise includes walking, but has not been doing much since her dog passed away. Weight has been stable. Denies any chest pain, headaches, shortness of breath, vision changes, swelling in lower extremities. She does get dizzy if she bends over and then comes up really fast, but if she takes her time she is okay.   Hyperlipidemia: started her back on her crestor. Needs to be on this with stroke hx.   Hair loss: she feels like her hair is falling out an abnormal amount. She states this has been going on for a year. She notices it the most when she combs her hair. She has no circular bald spots. It is thinning as well. No female pattern hair loss/baldness. She stopped getting her hair permed or using any harsh products a few months ago.   Review of Systems  Constitutional: Negative for chills, fatigue and fever.  HENT: Negative for dental problem, ear pain, hearing loss and trouble swallowing.   Eyes: Negative for visual disturbance.  Respiratory: Negative for cough, chest tightness and shortness of breath.   Cardiovascular: Negative for chest pain, palpitations and leg swelling.  Gastrointestinal: Negative for abdominal pain, blood in stool, diarrhea and nausea.  Endocrine: Negative for cold intolerance, polydipsia, polyphagia and polyuria.  Genitourinary: Negative for dysuria and hematuria.  Musculoskeletal: Negative for arthralgias.  Skin: Negative for rash.       excessive hair loss   Neurological: Negative for dizziness and headaches.  Psychiatric/Behavioral: Negative  for dysphoric mood and sleep disturbance. The patient is not nervous/anxious.     Allergies Patient has No Known Allergies.  Past Medical History Patient  has a past medical history of Anxiety, Colonic polyp, FMD (facioscapulohumeral muscular dystrophy) (HCC), GERD (gastroesophageal reflux disease), Headache(784.0), Hypercholesterolemia, Hypertension, Idiopathic urticaria, and Varicose veins of lower extremities.  Surgical History Patient  has a past surgical history that includes Vaginal hysterectomy; bilateral breast implants; benign breast biopsy; varicose vein; cataract right eye w/ lens implant; excision of large anal polyp; Colonoscopy; Polypectomy; and Cardiac catheterization (N/A, 02/11/2016).  Family History Pateint's family history includes Breast cancer in her mother; Deep vein thrombosis in her mother; Dementia in her father; Diabetes in her mother; Hyperlipidemia in her mother; Hypertension in her mother; Pulmonary embolism in her mother; Sickle cell anemia in her cousin.  Social History Patient  reports that she has never smoked. She has never used smokeless tobacco. She reports that she does not drink alcohol or use drugs.    Objective: Vitals:   12/16/19 1314  BP: 122/62  Pulse: (!) 59  Temp: 97.9 F (36.6 C)  TempSrc: Temporal  SpO2: 96%  Weight: 140 lb 9.6 oz (63.8 kg)  Height: 5\' 3"  (1.6 m)    Body mass index is 24.91 kg/m.  Physical Exam Vitals reviewed.  Constitutional:      Appearance: Normal appearance. She is well-developed and normal weight.  HENT:     Head: Normocephalic and atraumatic.     Right Ear: Tympanic membrane, ear canal and external ear normal.     Left  Ear: Tympanic membrane, ear canal and external ear normal.  Eyes:     Extraocular Movements: Extraocular movements intact.     Conjunctiva/sclera: Conjunctivae normal.     Pupils: Pupils are equal, round, and reactive to light.  Neck:     Thyroid: No thyromegaly.     Vascular: No  carotid bruit.  Cardiovascular:     Rate and Rhythm: Normal rate and regular rhythm.     Pulses: Normal pulses.     Heart sounds: Normal heart sounds. No murmur.  Pulmonary:     Effort: Pulmonary effort is normal.     Breath sounds: Normal breath sounds.  Abdominal:     General: Bowel sounds are normal. There is no distension.     Palpations: Abdomen is soft.     Tenderness: There is no abdominal tenderness.  Musculoskeletal:     Cervical back: Normal range of motion and neck supple.  Lymphadenopathy:     Cervical: No cervical adenopathy.  Skin:    General: Skin is warm and dry.     Capillary Refill: Capillary refill takes less than 2 seconds.     Findings: No rash.     Comments: Hair pull test negative. No alopecia. Thinning all around   Neurological:     General: No focal deficit present.     Mental Status: She is alert and oriented to person, place, and time.     Cranial Nerves: No cranial nerve deficit.     Coordination: Coordination normal.     Deep Tendon Reflexes: Reflexes normal.  Psychiatric:        Mood and Affect: Mood normal.        Behavior: Behavior normal.      Office Visit from 12/16/2019 in [facility]  PHQ-2 Total Score  0          Assessment/plan: 1. Essential hypertension Blood pressure is to goal. Continue current anti-hypertensive medications. Refills not given and routine lab work will be done today. Recommended routine exercise and healthy diet including DASH diet and mediterranean diet. Encouraged weight loss. F/u in 6 months.   - CBC with Differential/Platelet - Comprehensive metabolic panel - Microalbumin / creatinine urine ratio  2. HYPERCHOLESTEROLEMIA On crestor. Not fasting, but will check today as she is due.  - Lipid panel  3. Menopausal and postmenopausal disorder Number given and she gets second covid vaccine this week. Feels more comfortable getting this done now.  - DG Bone Density; Future  4. Hair  loss No alopecia or concerning finding on exam. Checking thyroid. Nature thinning vs. Telogen effluvium. Check labs and then go from there. Did recommend over the counter rhogain or other otc minoxidil containing product. discussed this can be psychologically hard to deal with so if it really bothers her we can send to derm.  - TSH   This visit occurred during the SARS-CoV-2 public health emergency.  Safety protocols were in place, including screening questions prior to the visit, additional usage of staff PPE, and extensive cleaning of exam room while observing appropriate contact time as indicated for disinfecting solutions.     Return in about 6 months (around 06/17/2020) for htn/routine follow up .     Orland Mustard, MD [place] [facility]  12/16/2019

## 2019-12-19 ENCOUNTER — Other Ambulatory Visit: Payer: Self-pay | Admitting: Family Medicine

## 2019-12-19 DIAGNOSIS — Z780 Asymptomatic menopausal state: Secondary | ICD-10-CM

## 2019-12-29 ENCOUNTER — Other Ambulatory Visit: Payer: Self-pay | Admitting: Family Medicine

## 2020-01-06 ENCOUNTER — Ambulatory Visit
Admission: RE | Admit: 2020-01-06 | Discharge: 2020-01-06 | Disposition: A | Discharge status: Home / Self Care | Payer: Medicare Other | Source: Ambulatory Visit | Attending: Family Medicine | Admitting: Family Medicine

## 2020-01-06 ENCOUNTER — Other Ambulatory Visit: Payer: Self-pay

## 2020-01-06 DIAGNOSIS — Z780 Asymptomatic menopausal state: Secondary | ICD-10-CM | POA: Diagnosis not present

## 2020-06-18 ENCOUNTER — Ambulatory Visit: Payer: Medicare Other | Admitting: Family Medicine

## 2020-06-26 ENCOUNTER — Other Ambulatory Visit: Payer: Self-pay | Admitting: Family Medicine

## 2020-06-30 ENCOUNTER — Other Ambulatory Visit: Payer: Self-pay | Admitting: Family Medicine

## 2020-06-30 DIAGNOSIS — H40013 Open angle with borderline findings, low risk, bilateral: Secondary | ICD-10-CM | POA: Diagnosis not present

## 2020-07-02 ENCOUNTER — Other Ambulatory Visit: Payer: Self-pay | Admitting: Family Medicine

## 2020-07-09 ENCOUNTER — Encounter: Payer: Self-pay | Admitting: Family Medicine

## 2020-07-09 ENCOUNTER — Other Ambulatory Visit: Payer: Self-pay

## 2020-07-09 ENCOUNTER — Ambulatory Visit (INDEPENDENT_AMBULATORY_CARE_PROVIDER_SITE_OTHER): Payer: Medicare Other | Admitting: Family Medicine

## 2020-07-09 VITALS — BP 118/60 | HR 61 | Temp 97.9°F | Ht 63.0 in | Wt 140.8 lb

## 2020-07-09 DIAGNOSIS — Z23 Encounter for immunization: Secondary | ICD-10-CM

## 2020-07-09 DIAGNOSIS — I10 Essential (primary) hypertension: Secondary | ICD-10-CM | POA: Diagnosis not present

## 2020-07-09 NOTE — Progress Notes (Signed)
Patient: [name] [name] MRN: [id number] DOB: 1951/02/19 PCP: Orland Mustard, MD     Subjective:  Chief Complaint  Patient presents with  . Hypertension  . Hyperlipidemia    HPI: The patient is a 69 y.o. female who presents today for HTN. Pt is requesting a flu shot today.   Hypertension: Here for follow up of hypertension.  Currently on norvasc 5mg , avapro 150mg  and bystolic 10mg . Takes medication as prescribed and denies any side effects. Exercise includes walking, but has not been doing much since her dog passed away. Weight has been stable. Denies any chest pain, headaches, shortness of breath, vision changes, swelling in lower extremities. bystolic is too expensive and would like something cheaper. Just filled for 3 months.   Requesting flu vaccine.   Review of Systems  Constitutional: Negative for appetite change.  Cardiovascular: Negative for chest pain and palpitations.  Genitourinary: Negative for frequency and pelvic pain.  Neurological: Negative for dizziness and headaches.    Allergies Patient has No Known Allergies.  Past Medical History Patient  has a past medical history of Anxiety, Colonic polyp, FMD (facioscapulohumeral muscular dystrophy) (HCC), GERD (gastroesophageal reflux disease), Headache(784.0), Hypercholesterolemia, Hypertension, Idiopathic urticaria, and Varicose veins of lower extremities.  Surgical History Patient  has a past surgical history that includes Vaginal hysterectomy; bilateral breast implants; benign breast biopsy; varicose vein; cataract right eye w/ lens implant; excision of large anal polyp; Colonoscopy; Polypectomy; and Cardiac catheterization (N/A, 02/11/2016).  Family History Pateint's family history includes Breast cancer in her mother; Deep vein thrombosis in her mother; Dementia in her father; Diabetes in her mother; Hyperlipidemia in her mother; Hypertension in her mother; Pulmonary embolism in her mother; Sickle cell anemia in her  cousin.  Social History Patient  reports that she has never smoked. She has never used smokeless tobacco. She reports that she does not drink alcohol and does not use drugs.    Objective: Vitals:   07/09/20 1401  BP: 118/60  Pulse: 61  Temp: 97.9 F (36.6 C)  TempSrc: Temporal  SpO2: 98%  Weight: 140 lb 12.8 oz (63.9 kg)  Height: 5\' 3"  (1.6 m)    Body mass index is 24.94 kg/m.  Physical Exam Vitals reviewed.  Constitutional:      Appearance: Normal appearance. She is normal weight.  HENT:     Head: Normocephalic and atraumatic.  Neck:     Vascular: No carotid bruit.  Cardiovascular:     Rate and Rhythm: Normal rate and regular rhythm.     Pulses: Normal pulses.  Pulmonary:     Effort: Pulmonary effort is normal.     Breath sounds: Normal breath sounds.  Abdominal:     General: Abdomen is flat. Bowel sounds are normal.     Palpations: Abdomen is soft.  Musculoskeletal:     Cervical back: Normal range of motion and neck supple.  Neurological:     General: No focal deficit present.     Mental Status: She is alert and oriented to person, place, and time.  Psychiatric:        Mood and Affect: Mood normal.        Behavior: Behavior normal.        Assessment/plan: 1. Essential hypertension Blood pressure is to goal. Continue current anti-hypertensive medications-norvasc 5mg , avapro 150mg  and bystolic 10mg /day. She just refilled her bystolic for 3 months but it's too expensive and she would like another option. We are going to wean her off bystolic when nears end of  3 months (taper given) and then increase her norvasc to 10mg . She will keep log for me and be in close contact with blood pressure readings. Refills given and routine lab work will be done today. Recommended routine exercise and healthy diet including DASH diet and mediterranean diet. Encouraged weight loss. F/u in 6 months.   - CBC with Differential/Platelet; Future - COMPLETE METABOLIC PANEL WITH GFR;  Future  -discussed covid booster/shingles vaccine. _-> wait at least 2 months for shingles vaccine from covid shot.  -flu shot today.   This visit occurred during the SARS-CoV-2 public health emergency.  Safety protocols were in place, including screening questions prior to the visit, additional usage of staff PPE, and extensive cleaning of exam room while observing appropriate contact time as indicated for disinfecting solutions.     Return in about 6 months (around 01/07/2021) for htn/hyperlipidemia/fasting labs .   Orland Mustard, MD [place] [facility]   07/09/2020

## 2020-07-09 NOTE — Patient Instructions (Addendum)
-  when getting near to the end of the bystolic I want you to take 1/2 a pill for 1-2 weeks then every other day for 3 days then stop. After you stop this you will increase the norvasc to 10mg /day. email me and I can send in a 10mg  pill for you at this time. Then you need to watch your blood pressure closely to make sure still in goal.   -refilled your ativan.   -shingles shots at pharmacy. Too expensive here but please wait at least 2 months from covid vaccine.    -keep in touch with me when we change over blood pressure medication.   So good to see you!  Dr. Rogers Blocker

## 2020-07-10 LAB — CBC WITH DIFFERENTIAL/PLATELET
Absolute Monocytes: 661 cells/uL (ref 200–950)
Basophils Absolute: 41 cells/uL (ref 0–200)
Basophils Relative: 0.7 %
Eosinophils Absolute: 99 cells/uL (ref 15–500)
Eosinophils Relative: 1.7 %
HCT: 36.9 % (ref 35.0–45.0)
Hemoglobin: 12.3 g/dL (ref 11.7–15.5)
Lymphs Abs: 1757 cells/uL (ref 850–3900)
MCH: 29.4 pg (ref 27.0–33.0)
MCHC: 33.3 g/dL (ref 32.0–36.0)
MCV: 88.1 fL (ref 80.0–100.0)
MPV: 9.5 fL (ref 7.5–12.5)
Monocytes Relative: 11.4 %
Neutro Abs: 3242 cells/uL (ref 1500–7800)
Neutrophils Relative %: 55.9 %
Platelets: 280 10*3/uL (ref 140–400)
RBC: 4.19 10*6/uL (ref 3.80–5.10)
RDW: 13.1 % (ref 11.0–15.0)
Total Lymphocyte: 30.3 %
WBC: 5.8 10*3/uL (ref 3.8–10.8)

## 2020-07-10 LAB — COMPLETE METABOLIC PANEL WITH GFR
AG Ratio: 1.4 (calc) (ref 1.0–2.5)
ALT: 26 U/L (ref 6–29)
AST: 28 U/L (ref 10–35)
Albumin: 4.2 g/dL (ref 3.6–5.1)
Alkaline phosphatase (APISO): 77 U/L (ref 37–153)
BUN/Creatinine Ratio: 18 (calc) (ref 6–22)
BUN: 20 mg/dL (ref 7–25)
CO2: 30 mmol/L (ref 20–32)
Calcium: 9.5 mg/dL (ref 8.6–10.4)
Chloride: 103 mmol/L (ref 98–110)
Creat: 1.11 mg/dL — ABNORMAL HIGH (ref 0.50–0.99)
GFR, Est African American: 60 mL/min/{1.73_m2} (ref >=60)
GFR, Est Non African American: 51 mL/min/{1.73_m2} — ABNORMAL LOW (ref >=60)
Globulin: 3.1 g/dL (calc) (ref 1.9–3.7)
Glucose, Bld: 83 mg/dL (ref 65–99)
Potassium: 3.8 mmol/L (ref 3.5–5.3)
Sodium: 141 mmol/L (ref 135–146)
Total Bilirubin: 0.5 mg/dL (ref 0.2–1.2)
Total Protein: 7.3 g/dL (ref 6.1–8.1)

## 2020-07-10 LAB — TIQ-NTM

## 2020-07-14 ENCOUNTER — Telehealth: Payer: Self-pay

## 2020-07-14 NOTE — Telephone Encounter (Signed)
Patient is calling back regarding lab results

## 2020-07-15 DIAGNOSIS — Z1231 Encounter for screening mammogram for malignant neoplasm of breast: Secondary | ICD-10-CM | POA: Diagnosis not present

## 2020-07-15 NOTE — Telephone Encounter (Signed)
Pt returned call for lab results. See results for 07/09/2020

## 2020-07-15 NOTE — Telephone Encounter (Signed)
Left message for [name] [name] to call back.

## 2020-12-17 ENCOUNTER — Other Ambulatory Visit: Payer: Self-pay | Admitting: Family Medicine

## 2020-12-28 ENCOUNTER — Other Ambulatory Visit: Payer: Self-pay | Admitting: Family Medicine

## 2021-01-07 ENCOUNTER — Other Ambulatory Visit: Payer: Self-pay

## 2021-01-07 ENCOUNTER — Encounter: Payer: Self-pay | Admitting: Family Medicine

## 2021-01-07 ENCOUNTER — Ambulatory Visit (INDEPENDENT_AMBULATORY_CARE_PROVIDER_SITE_OTHER): Payer: Medicare Other | Admitting: Family Medicine

## 2021-01-07 VITALS — BP 118/76 | HR 60 | Temp 97.5°F | Ht 63.0 in | Wt 137.0 lb

## 2021-01-07 DIAGNOSIS — I10 Essential (primary) hypertension: Secondary | ICD-10-CM | POA: Diagnosis not present

## 2021-01-07 DIAGNOSIS — E7800 Pure hypercholesterolemia, unspecified: Secondary | ICD-10-CM | POA: Diagnosis not present

## 2021-01-07 LAB — MICROALBUMIN / CREATININE URINE RATIO
Creatinine,U: 311.5 mg/dL
Microalb Creat Ratio: 10.2 mg/g (ref 0.0–30.0)
Microalb, Ur: 31.7 mg/dL — ABNORMAL HIGH (ref 0.0–1.9)

## 2021-01-07 MED ORDER — TRAZODONE HCL 50 MG PO TABS: 25.0000 mg | ORAL_TABLET | Freq: Every evening | ORAL | 1 refills | Status: DC | PRN

## 2021-01-07 MED ORDER — LORAZEPAM 1 MG PO TABS: 1.0000 mg | ORAL_TABLET | Freq: Every day | ORAL | 0 refills | Status: DC

## 2021-01-07 NOTE — Progress Notes (Signed)
Patient: [name] [name] MRN: [id number] DOB: 1951-05-18 PCP: Orland Mustard, MD     Subjective:  Chief Complaint  Patient presents with  . Hypertension       . Hyperlipidemia  . Insomnia    HPI: The patient is a 70 y.o. female who presents today for hypertension and hyperlipidemia follow up. Has complains of insomnia.   Hypertension: Here for follow up of hypertension. Currently on norvasc 5mg , avapro 150mg  and bystolic 10mg .Takes medication as prescribed and denies any side effects. Exercise includes walking.Weight has been stable. Denies any chest pain, headaches, shortness of breath, vision changes, swelling in lower extremities. bystolic is too expensive and would like something cheaper. Just filled for 3 months. She actually ended up getting generic version of bystolic so is still on this and no changes in her medication. She has lost 3 pounds since her last visit and is walking much more.   Hyperlipidemia: started her back on her crestor. Needs to be on this with stroke hx. I started her back on this a year ago. She takes this as prescribed.   Insomnia She has had this since her parents died then her dog died a year ago and she has been having trouble sleeping since this time. She states she stays up all night then sleeps all day. She feels safer sleeping during the day. Every noise keeps her up at night. She has tried melatonin and this doesn't work. She will very rarely take a lorazepam but doesn't like taking this and this works well for her. She watches TV in bed, drinks caffeine after 2pm.   Review of Systems  Constitutional: Negative for chills, fatigue and fever.  HENT: Negative for dental problem, ear pain, hearing loss and trouble swallowing.   Eyes: Negative for visual disturbance.  Respiratory: Negative for cough, chest tightness and shortness of breath.   Cardiovascular: Negative for chest pain, palpitations and leg swelling.  Gastrointestinal: Negative for  abdominal pain, blood in stool, diarrhea and nausea.  Endocrine: Negative for cold intolerance, polydipsia, polyphagia and polyuria.  Genitourinary: Negative for dysuria and hematuria.  Musculoskeletal: Negative for arthralgias.  Skin: Negative for rash.  Neurological: Negative for dizziness and headaches.  Psychiatric/Behavioral: Negative for dysphoric mood and sleep disturbance. The patient is not nervous/anxious.     Allergies Patient has No Known Allergies.  Past Medical History Patient  has a past medical history of Anxiety, Colonic polyp, FMD (facioscapulohumeral muscular dystrophy) (HCC), GERD (gastroesophageal reflux disease), Headache(784.0), Hypercholesterolemia, Hypertension, Idiopathic urticaria, and Varicose veins of lower extremities.  Surgical History Patient  has a past surgical history that includes Vaginal hysterectomy; bilateral breast implants; benign breast biopsy; varicose vein; cataract right eye w/ lens implant; excision of large anal polyp; Colonoscopy; Polypectomy; and Cardiac catheterization (N/A, 02/11/2016).  Family History Pateint's family history includes Breast cancer in her mother; Deep vein thrombosis in her mother; Dementia in her father; Diabetes in her mother; Hyperlipidemia in her mother; Hypertension in her mother; Pulmonary embolism in her mother; Sickle cell anemia in her cousin.  Social History Patient  reports that she has never smoked. She has never used smokeless tobacco. She reports that she does not drink alcohol and does not use drugs.    Objective: Vitals:   01/07/21 1339  BP: 118/76  Pulse: 60  Temp: (!) 97.5 F (36.4 C)  TempSrc: Temporal  SpO2: 99%  Weight: 137 lb (62.1 kg)  Height: 5\' 3"  (1.6 m)    Body mass index is 24.27 kg/m.  Physical Exam Vitals reviewed.  Constitutional:      Appearance: Normal appearance. She is well-developed and normal weight.  HENT:     Head: Normocephalic and atraumatic.     Right Ear:  Tympanic membrane, ear canal and external ear normal.     Left Ear: Tympanic membrane and external ear normal.  Eyes:     Conjunctiva/sclera: Conjunctivae normal.     Pupils: Pupils are equal, round, and reactive to light.  Neck:     Thyroid: No thyromegaly.     Vascular: No carotid bruit.  Cardiovascular:     Rate and Rhythm: Normal rate and regular rhythm.     Pulses: Normal pulses.     Heart sounds: Normal heart sounds. No murmur heard.   Pulmonary:     Effort: Pulmonary effort is normal.     Breath sounds: Normal breath sounds.  Abdominal:     General: Bowel sounds are normal. There is no distension.     Palpations: Abdomen is soft.     Tenderness: There is no abdominal tenderness.  Musculoskeletal:     Cervical back: Normal range of motion and neck supple.  Lymphadenopathy:     Cervical: No cervical adenopathy.  Skin:    General: Skin is warm and dry.     Capillary Refill: Capillary refill takes less than 2 seconds.     Findings: No rash.  Neurological:     General: No focal deficit present.     Mental Status: She is alert and oriented to person, place, and time.     Cranial Nerves: No cranial nerve deficit.     Coordination: Coordination normal.     Deep Tendon Reflexes: Reflexes normal.  Psychiatric:        Mood and Affect: Mood normal.        Behavior: Behavior normal.    Flowsheet Row Office Visit from 07/09/2020 in [facility]  PHQ-2 Total Score 0          Assessment/plan: 1. Essential hypertension Blood pressure is to goal. Continue current anti-hypertensive medication-norvasc 5mg /day, avapro 150mg /day and bystolic 10mg /day. Refills not given and routine lab work will be done today. Recommended routine exercise and healthy diet including DASH diet and mediterranean diet. Encouraged weight loss. F/u in 6 months.    2. HYPERCHOLESTEROLEMIA -continue crestor -lipid panel today.   3. Insomnia -trial conservative measures such as  really working on sleep hygiene. This was written down in detail for her.  -lavendar oil, valerian root and ashwagandha. (failed melatonin)  -if these do not work, px of trazodone sent in for her to use as needed.  -f/u in 6 months for recheck or sooner if needed.    Return in about 6 months (around 07/09/2021) for routine htn/insomnia f/u -TOC with alyssa .     Orland Mustard, MD [facility]  01/07/2021

## 2021-01-07 NOTE — Addendum Note (Signed)
Addended by: Brandy Hale on: 01/07/2021 02:47 PM   Modules accepted: Orders

## 2021-01-07 NOTE — Patient Instructions (Addendum)
-  bed for sleep and sex only. No TV or reading in bed.  -no caffeine after 2pm -exercise, get outside during the day -if you can't sleep, get up and do something else after 30 minutes.  -lavendar oil is good for sleep, valerian root and ashwagandha.    -we can also try a px drug called trazodone that you can take as needed. I sent this in for you to try, but you can try the above as well.   You can f/u in 6 months for the sleep issues at regular follow up but let me know if any issues!  Good to see you!  Dr. Rogers Blocker

## 2021-01-08 ENCOUNTER — Other Ambulatory Visit (INDEPENDENT_AMBULATORY_CARE_PROVIDER_SITE_OTHER): Payer: Medicare Other

## 2021-01-08 DIAGNOSIS — E7800 Pure hypercholesterolemia, unspecified: Secondary | ICD-10-CM

## 2021-01-08 DIAGNOSIS — I10 Essential (primary) hypertension: Secondary | ICD-10-CM

## 2021-01-08 LAB — COMPREHENSIVE METABOLIC PANEL
ALT: 25 U/L (ref 0–35)
AST: 31 U/L (ref 0–37)
Albumin: 4.2 g/dL (ref 3.5–5.2)
Alkaline Phosphatase: 72 U/L (ref 39–117)
BUN: 24 mg/dL — ABNORMAL HIGH (ref 6–23)
CO2: 28 mEq/L (ref 19–32)
Calcium: 9.9 mg/dL (ref 8.4–10.5)
Chloride: 104 mEq/L (ref 96–112)
Creatinine, Ser: 0.96 mg/dL (ref 0.40–1.20)
GFR: 61.08 mL/min (ref >60.00)
Glucose, Bld: 74 mg/dL (ref 70–99)
Potassium: 3.5 mEq/L (ref 3.5–5.1)
Sodium: 141 mEq/L (ref 135–145)
Total Bilirubin: 0.5 mg/dL (ref 0.2–1.2)
Total Protein: 7.6 g/dL (ref 6.0–8.3)

## 2021-01-08 LAB — LIPID PANEL
Cholesterol: 133 mg/dL (ref 0–200)
HDL: 63.9 mg/dL (ref >39.00)
LDL Cholesterol: 59 mg/dL (ref 0–99)
NonHDL: 69.22
Total CHOL/HDL Ratio: 2
Triglycerides: 50 mg/dL (ref 0.0–149.0)
VLDL: 10 mg/dL (ref 0.0–40.0)

## 2021-01-08 LAB — CBC WITH DIFFERENTIAL/PLATELET
Basophils Absolute: 0 10*3/uL (ref 0.0–0.1)
Basophils Relative: 0.6 % (ref 0.0–3.0)
Eosinophils Absolute: 0.1 10*3/uL (ref 0.0–0.7)
Eosinophils Relative: 2.2 % (ref 0.0–5.0)
HCT: 40.5 % (ref 36.0–46.0)
Hemoglobin: 13.2 g/dL (ref 12.0–15.0)
Lymphocytes Relative: 28.2 % (ref 12.0–46.0)
Lymphs Abs: 1.3 10*3/uL (ref 0.7–4.0)
MCHC: 32.6 g/dL (ref 30.0–36.0)
MCV: 89.4 fl (ref 78.0–100.0)
Monocytes Absolute: 0.5 10*3/uL (ref 0.1–1.0)
Monocytes Relative: 10.3 % (ref 3.0–12.0)
Neutro Abs: 2.7 10*3/uL (ref 1.4–7.7)
Neutrophils Relative %: 58.7 % (ref 43.0–77.0)
Platelets: 260 10*3/uL (ref 150.0–400.0)
RBC: 4.53 Mil/uL (ref 3.87–5.11)
RDW: 13.6 % (ref 11.5–15.5)
WBC: 4.5 10*3/uL (ref 4.0–10.5)

## 2021-01-12 ENCOUNTER — Telehealth: Payer: Self-pay

## 2021-01-12 NOTE — Telephone Encounter (Signed)
Patient calling back about lab results patient would like a call back at [phone].

## 2021-01-13 NOTE — Telephone Encounter (Signed)
Returned pt's call. See lab note.

## 2021-03-11 ENCOUNTER — Other Ambulatory Visit: Payer: Self-pay

## 2021-03-11 ENCOUNTER — Ambulatory Visit (INDEPENDENT_AMBULATORY_CARE_PROVIDER_SITE_OTHER): Payer: Medicare Other | Admitting: Family

## 2021-03-11 ENCOUNTER — Encounter: Payer: Self-pay | Admitting: Family

## 2021-03-11 VITALS — BP 145/79 | HR 58 | Temp 97.7°F | Ht 63.0 in | Wt 138.6 lb

## 2021-03-11 DIAGNOSIS — H9313 Tinnitus, bilateral: Secondary | ICD-10-CM

## 2021-03-11 DIAGNOSIS — H6123 Impacted cerumen, bilateral: Secondary | ICD-10-CM | POA: Diagnosis not present

## 2021-03-13 NOTE — Progress Notes (Signed)
Acute Office Visit  Subjective:    Patient ID: [name] [name], female    DOB: 10-Jun-1951, 70 y.o.   MRN: [id number]  Chief Complaint  Patient presents with  . Ringing    Pt says that the ringing is more in her temples. Starting a month ago. She denies headache, but has some dizziness.    HPI Patient is in today with c/o ringing in her ears x 1 month with mild dizziness when going from a sitting to standing position or leaning forward. She has congestion at times. No therapy tried. Denies headaches or blurred vision.   Past Medical History:  Diagnosis Date  . Anxiety   . Colonic polyp   . FMD (facioscapulohumeral muscular dystrophy) (HCC)   . GERD (gastroesophageal reflux disease)   . Headache(784.0)   . Hypercholesterolemia   . Hypertension   . Idiopathic urticaria   . Varicose veins of lower extremities     Past Surgical History:  Procedure Laterality Date  . benign breast biopsy    . bilateral breast implants    . cataract right eye w/ lens implant    . COLONOSCOPY    . excision of large anal polyp    . PERIPHERAL VASCULAR CATHETERIZATION N/A 02/11/2016   Procedure: Renal Angiography;  Surgeon: Fransisco Hertz, MD;  Location: [facility] INVASIVE CV LAB;  Service: Cardiovascular;  Laterality: N/A;  . POLYPECTOMY    . VAGINAL HYSTERECTOMY    . varicose vein      Family History  Problem Relation Age of Onset  . Hypertension Mother   . Hyperlipidemia Mother   . Diabetes Mother   . Breast cancer Mother   . Pulmonary embolism Mother   . Deep vein thrombosis Mother   . Dementia Father   . Sickle cell anemia Cousin   . Colon cancer Neg Hx   . Esophageal cancer Neg Hx   . Stomach cancer Neg Hx     Social History   Socioeconomic History  . Marital status: Single    Spouse name: Not on file  . Number of children: 0  . Years of education: 46  . Highest education level: Not on file  Occupational History    Comment: [employer]  Tobacco Use  . Smoking status: Never  .  Smokeless tobacco: Never  Substance and Sexual Activity  . Alcohol use: No    Alcohol/week: 0.0 standard drinks  . Drug use: No  . Sexual activity: Not on file  Other Topics Concern  . Not on file  Social History Narrative   Lives alone   No caffeine use   Social Determinants of Health   Financial Resource Strain: Not on file  Food Insecurity: Not on file  Transportation Needs: Not on file  Physical Activity: Not on file  Stress: Not on file  Social Connections: Not on file  Intimate Partner Violence: Not on file    Outpatient Medications Prior to Visit  Medication Sig Dispense Refill  . amLODipine (NORVASC) 5 MG tablet TAKE 1 TABLET BY MOUTH EVERY DAY 90 tablet 3  . aspirin EC 81 MG tablet Take 81 mg by mouth daily.    Marland Kitchen BIOTIN PO Take by mouth.    . cholecalciferol (VITAMIN D) 1000 units tablet Take 1,000 Units by mouth daily.    Marland Kitchen estradiol (ESTRACE) 0.5 MG tablet Take 0.5 mg by mouth daily. Daily for 21 days, then do not take for 7 days    . irbesartan (AVAPRO) 150  MG tablet TAKE 1 TABLET BY MOUTH EVERY DAY 90 tablet 1  . LORazepam (ATIVAN) 1 MG tablet Take 1 tablet (1 mg total) by mouth at bedtime. 30 tablet 0  . nebivolol (BYSTOLIC) 10 MG tablet TAKE 1 TABLET BY MOUTH EVERY DAY 90 tablet 3  . rosuvastatin (CRESTOR) 10 MG tablet TAKE 1 TABLET BY MOUTH EVERY DAY 90 tablet 3  . traZODone (DESYREL) 50 MG tablet Take 0.5-1 tablets (25-50 mg total) by mouth at bedtime as needed for sleep. 90 tablet 1  . vitamin E 400 UNIT capsule Take 400 Units by mouth daily.     No facility-administered medications prior to visit.    No Known Allergies  Review of Systems  Constitutional: Negative.   HENT:  Negative for ear discharge and ear pain.        Ringing in the ears  Eyes: Negative.   Gastrointestinal: Negative.   Endocrine: Negative.   Skin: Negative.   Allergic/Immunologic: Negative.   Neurological: Negative.  Negative for tremors, weakness, light-headedness, numbness and  headaches.  Psychiatric/Behavioral: Negative.    All other systems reviewed and are negative.     Objective:    Physical Exam Vitals and nursing note reviewed.  Constitutional:      Appearance: Normal appearance.  HENT:     Head: Normocephalic.     Right Ear: There is impacted cerumen.     Left Ear: There is impacted cerumen.     Ears:     Comments: Ear Lavage: Informed consent was obtained and peroxide gel was inserted into the ears bilaterally using the lavage kit the ears were lavaged until clean.Inspection with a cerumen spoon removed residual wax. Patient tolerated the procedure well.  Eyes:     Extraocular Movements: Extraocular movements intact.     Conjunctiva/sclera: Conjunctivae normal.     Pupils: Pupils are equal, round, and reactive to light.  Cardiovascular:     Rate and Rhythm: Normal rate and regular rhythm.     Pulses: Normal pulses.     Heart sounds: Normal heart sounds.  Pulmonary:     Effort: Pulmonary effort is normal.     Breath sounds: Normal breath sounds.  Abdominal:     General: Abdomen is flat.     Palpations: Abdomen is soft.  Musculoskeletal:        General: Normal range of motion.     Cervical back: Normal range of motion and neck supple.  Skin:    General: Skin is warm and dry.  Neurological:     General: No focal deficit present.     Mental Status: She is alert and oriented to person, place, and time.  Psychiatric:        Mood and Affect: Mood normal.        Behavior: Behavior normal.   BP (!) 145/79   Pulse (!) 58   Temp 97.7 F (36.5 C) (Temporal)   Ht 5\' 3"  (1.6 m)   Wt 138 lb 9.6 oz (62.9 kg)   LMP  (LMP Unknown)   SpO2 98%   BMI 24.55 kg/m  Wt Readings from Last 3 Encounters:  03/11/21 138 lb 9.6 oz (62.9 kg)  01/07/21 137 lb (62.1 kg)  07/09/20 140 lb 12.8 oz (63.9 kg)    Health Maintenance Due  Topic Date Due  . Zoster Vaccines- Shingrix (1 of 2) Never done  . PNA vac Low Risk Adult (2 of 2 - PCV13) 12/13/2019  .  MAMMOGRAM  02/24/2020  .  TETANUS/TDAP  07/14/2020    There are no preventive care reminders to display for this patient.   Lab Results  Component Value Date   TSH 2.25 12/16/2019   Lab Results  Component Value Date   WBC 4.5 01/08/2021   HGB 13.2 01/08/2021   HCT 40.5 01/08/2021   MCV 89.4 01/08/2021   PLT 260.0 01/08/2021   Lab Results  Component Value Date   NA 141 01/08/2021   K 3.5 01/08/2021   CO2 28 01/08/2021   GLUCOSE 74 01/08/2021   BUN 24 (H) 01/08/2021   CREATININE 0.96 01/08/2021   BILITOT 0.5 01/08/2021   ALKPHOS 72 01/08/2021   AST 31 01/08/2021   ALT 25 01/08/2021   PROT 7.6 01/08/2021   ALBUMIN 4.2 01/08/2021   CALCIUM 9.9 01/08/2021   ANIONGAP 10 12/15/2015   GFR 61.08 01/08/2021   Lab Results  Component Value Date   CHOL 133 01/08/2021   Lab Results  Component Value Date   HDL 63.90 01/08/2021   Lab Results  Component Value Date   LDLCALC 59 01/08/2021   Lab Results  Component Value Date   TRIG 50.0 01/08/2021   Lab Results  Component Value Date   CHOLHDL 2 01/08/2021   No results found for: HGBA1C     Assessment & Plan:   Problem List Items Addressed This Visit   None Visit Diagnoses     Bilateral impacted cerumen    -  Primary   Ringing in ears, bilateral       Relevant Orders   Ambulatory referral to ENT       Ears successfully lavaged without difficulty. Refer to ENT as she still has mild ringing in her ears post procedure. She will cancel if her symptoms resolve.  No orders of the defined types were placed in this encounter.    Eulis Foster, FNP

## 2021-03-27 ENCOUNTER — Other Ambulatory Visit: Payer: Self-pay | Admitting: Family Medicine

## 2021-05-18 DIAGNOSIS — H35413 Lattice degeneration of retina, bilateral: Secondary | ICD-10-CM | POA: Diagnosis not present

## 2021-06-03 ENCOUNTER — Ambulatory Visit (INDEPENDENT_AMBULATORY_CARE_PROVIDER_SITE_OTHER): Payer: Medicare Other | Admitting: Otolaryngology

## 2021-06-03 ENCOUNTER — Other Ambulatory Visit: Payer: Self-pay

## 2021-06-03 DIAGNOSIS — H6122 Impacted cerumen, left ear: Secondary | ICD-10-CM

## 2021-06-03 DIAGNOSIS — H9313 Tinnitus, bilateral: Secondary | ICD-10-CM | POA: Diagnosis not present

## 2021-06-03 DIAGNOSIS — H9311 Tinnitus, right ear: Secondary | ICD-10-CM

## 2021-06-03 NOTE — Progress Notes (Signed)
HPI: [name] [name] is a 70 y.o. female who presents is referred by her PCP for evaluation of tinnitus in the right ear that began back in May.  At first she just noticed it in the mornings but then over the past few months it seemed little bit more chronic and she hurt it during the day.  She has not noted any change in her hearing.  She denies any family history of hearing loss. Denies any loud noise exposure..  Past Medical History:  Diagnosis Date   Anxiety    Colonic polyp    FMD (facioscapulohumeral muscular dystrophy) (HCC)    GERD (gastroesophageal reflux disease)    Headache(784.0)    Hypercholesterolemia    Hypertension    Idiopathic urticaria    Varicose veins of lower extremities    Past Surgical History:  Procedure Laterality Date   benign breast biopsy     bilateral breast implants     cataract right eye w/ lens implant     COLONOSCOPY     excision of large anal polyp     PERIPHERAL VASCULAR CATHETERIZATION N/A 02/11/2016   Procedure: Renal Angiography;  Surgeon: Conrad St. Paul, MD;  Location: [facility];  Service: Cardiovascular;  Laterality: N/A;   POLYPECTOMY     VAGINAL HYSTERECTOMY     varicose vein     Social History   Socioeconomic History   Marital status: Single    Spouse name: Not on file   Number of children: 0   Years of education: 12   Highest education level: Not on file  Occupational History    Comment: [employer]  Tobacco Use   Smoking status: Never   Smokeless tobacco: Never  Substance and Sexual Activity   Alcohol use: No    Alcohol/week: 0.0 standard drinks   Drug use: No   Sexual activity: Not on file  Other Topics Concern   Not on file  Social History Narrative   Lives alone   No caffeine use   Social Determinants of Health   Financial Resource Strain: Not on file  Food Insecurity: Not on file  Transportation Needs: Not on file  Physical Activity: Not on file  Stress: Not on file  Social Connections: Not on file   Family  History  Problem Relation Age of Onset   Hypertension Mother    Hyperlipidemia Mother    Diabetes Mother    Breast cancer Mother    Pulmonary embolism Mother    Deep vein thrombosis Mother    Dementia Father    Sickle cell anemia Cousin    Colon cancer Neg Hx    Esophageal cancer Neg Hx    Stomach cancer Neg Hx    No Known Allergies Prior to Admission medications   Medication Sig Start Date End Date Taking? Authorizing Provider  amLODipine (NORVASC) 5 MG tablet TAKE 1 TABLET BY MOUTH EVERY DAY 12/17/20   Orma Flaming, MD  aspirin EC 81 MG tablet Take 81 mg by mouth daily.    [provider]  BIOTIN PO Take by mouth.    [provider]  cholecalciferol (VITAMIN D) 1000 units tablet Take 1,000 Units by mouth daily.    [provider]  estradiol (ESTRACE) 0.5 MG tablet Take 0.5 mg by mouth daily. Daily for 21 days, then do not take for 7 days    [provider]  irbesartan (AVAPRO) 150 MG tablet TAKE 1 TABLET BY MOUTH EVERY DAY 12/29/20  Orma Flaming, MD  LORazepam (ATIVAN) 1 MG tablet Take 1 tablet (1 mg total) by mouth at bedtime. 01/07/21   Orma Flaming, MD  nebivolol (BYSTOLIC) 10 MG tablet TAKE 1 TABLET BY MOUTH EVERY DAY 07/02/20   Orma Flaming, MD  rosuvastatin (CRESTOR) 10 MG tablet TAKE 1 TABLET BY MOUTH EVERY DAY 03/29/21   Allwardt, Randa Evens, PA-C  traZODone (DESYREL) 50 MG tablet Take 0.5-1 tablets (25-50 mg total) by mouth at bedtime as needed for sleep. 01/07/21   Orma Flaming, MD  vitamin E 400 UNIT capsule Take 400 Units by mouth daily.    [provider]     Positive ROS: Otherwise negative  All other systems have been reviewed and were otherwise negative with the exception of those mentioned in the HPI and as above.  Physical Exam: Constitutional: Alert, well-appearing, no acute distress Ears: External ears without lesions or tenderness. Ear canal is clear on the right with minimal wax buildup on the left that was  removed with a curette.  TMs are clear bilaterally. Nasal: External nose without lesions. Septum with minimal deformity.. Clear nasal passages Oral: Lips and gums without lesions. Tongue and palate mucosa without lesions. Posterior oropharynx clear. Neck: No palpable adenopathy or masses Respiratory: Breathing comfortably  Skin: No facial/neck lesions or rash noted.  Cerumen impaction removal  Date/Time: 06/03/2021 1:44 PM Performed by: Rozetta Nunnery, MD Authorized by: Rozetta Nunnery, MD   Consent:    Consent obtained:  Verbal   Consent given by:  Patient   Risks discussed:  Pain and bleeding Procedure details:    Location:  L ear   Procedure type: curette   Post-procedure details:    Inspection:  TM intact and canal normal   Hearing quality:  Improved   Procedure completion:  Tolerated well, no immediate complications Comments:     Minimal wax on the left side that was removed with a curette.  TMs are clear bilaterally.  Audiologic testing demonstrated normal hearing in both ears with perhaps a mild downsloping sensorineural hearing loss at 8000 frequency.  SRT's were 5 dB bilaterally.  She had type A tympanograms bilaterally.  Assessment: Tinnitus questionable etiology as she has normal hearing evaluation and audiologic testing.  Plan: Reviewed with her concerning normal hearing evaluation and normal ear examination except for minimal wax which was more on the left side. Reviewed with her that there is minimal treatment treatment options for tinnitus outside of using masking noise to help when the tinnitus is bothersome and to use ear protection when around loud noise as this can make it worse. I gave her some samples of Lipo flavonoid to try that is beneficial in some people with tinnitus although I am not sure this would be warranted in her case.   Radene Journey, MD   CC:

## 2021-06-10 ENCOUNTER — Encounter (INDEPENDENT_AMBULATORY_CARE_PROVIDER_SITE_OTHER): Payer: Self-pay

## 2021-06-23 ENCOUNTER — Encounter: Payer: Self-pay | Admitting: Physician Assistant

## 2021-06-23 ENCOUNTER — Ambulatory Visit (INDEPENDENT_AMBULATORY_CARE_PROVIDER_SITE_OTHER): Payer: Medicare Other | Admitting: Physician Assistant

## 2021-06-23 ENCOUNTER — Other Ambulatory Visit: Payer: Self-pay

## 2021-06-23 VITALS — BP 118/70 | HR 57 | Temp 98.2°F | Ht 63.0 in | Wt 137.2 lb

## 2021-06-23 DIAGNOSIS — E7800 Pure hypercholesterolemia, unspecified: Secondary | ICD-10-CM | POA: Diagnosis not present

## 2021-06-23 DIAGNOSIS — L659 Nonscarring hair loss, unspecified: Secondary | ICD-10-CM | POA: Diagnosis not present

## 2021-06-23 DIAGNOSIS — Z23 Encounter for immunization: Secondary | ICD-10-CM

## 2021-06-23 DIAGNOSIS — I10 Essential (primary) hypertension: Secondary | ICD-10-CM | POA: Diagnosis not present

## 2021-06-23 DIAGNOSIS — F5101 Primary insomnia: Secondary | ICD-10-CM

## 2021-06-23 NOTE — Progress Notes (Signed)
Subjective:    Patient ID: [name] [name], female    DOB: 12-22-1950, 70 y.o.   MRN: [id number]  Chief Complaint  Patient presents with   Transitions Of Care   Medication Review    HPI Patient is in today for [facility] from Dr. Artis Flock and review of medications. See A/P for details. She is still mostly concerned about her hair loss that has been going on the last few years.   Past Medical History:  Diagnosis Date   Anxiety    Colonic polyp    FMD (facioscapulohumeral muscular dystrophy) (HCC)    GERD (gastroesophageal reflux disease)    Headache(784.0)    Hypercholesterolemia    Hypertension    Idiopathic urticaria    Varicose veins of lower extremities     Past Surgical History:  Procedure Laterality Date   benign breast biopsy     bilateral breast implants     cataract right eye w/ lens implant     COLONOSCOPY     excision of large anal polyp     PERIPHERAL VASCULAR CATHETERIZATION N/A 02/11/2016   Procedure: Renal Angiography;  Surgeon: Fransisco Hertz, MD;  Location: [facility];  Service: Cardiovascular;  Laterality: N/A;   POLYPECTOMY     VAGINAL HYSTERECTOMY     varicose vein      Family History  Problem Relation Age of Onset   Hypertension Mother    Hyperlipidemia Mother    Diabetes Mother    Breast cancer Mother    Pulmonary embolism Mother    Deep vein thrombosis Mother    Dementia Father    Sickle cell anemia Cousin    Colon cancer Neg Hx    Esophageal cancer Neg Hx    Stomach cancer Neg Hx     Social History   Tobacco Use   Smoking status: Never   Smokeless tobacco: Never  Substance Use Topics   Alcohol use: No    Alcohol/week: 0.0 standard drinks   Drug use: No     No Known Allergies  Review of Systems REFER TO HPI FOR PERTINENT POSITIVES AND NEGATIVES      Objective:     BP 118/70   Pulse (!) 57   Temp 98.2 F (36.8 C)   Ht 5\' 3"  (1.6 m)   Wt 137 lb 3.2 oz (62.2 kg)   LMP  (LMP Unknown)   SpO2 99%   BMI 24.30 kg/m   Wt  Readings from Last 3 Encounters:  06/23/21 137 lb 3.2 oz (62.2 kg)  03/11/21 138 lb 9.6 oz (62.9 kg)  01/07/21 137 lb (62.1 kg)    BP Readings from Last 3 Encounters:  06/23/21 118/70  03/11/21 (!) 145/79  01/07/21 118/76     Physical Exam Vitals reviewed.  Constitutional:      Appearance: Normal appearance. She is well-developed and normal weight.  HENT:     Head: Normocephalic and atraumatic.     Right Ear: Tympanic membrane, ear canal and external ear normal.     Left Ear: Tympanic membrane, ear canal and external ear normal.  Eyes:     Conjunctiva/sclera: Conjunctivae normal.     Pupils: Pupils are equal, round, and reactive to light.  Neck:     Thyroid: No thyromegaly.     Vascular: No carotid bruit.  Cardiovascular:     Rate and Rhythm: Normal rate and regular rhythm.     Pulses: Normal pulses.     Heart sounds: Normal  heart sounds. No murmur heard. Pulmonary:     Effort: Pulmonary effort is normal.     Breath sounds: Normal breath sounds.  Abdominal:     General: Bowel sounds are normal. There is no distension.     Palpations: Abdomen is soft.     Tenderness: There is no abdominal tenderness.  Musculoskeletal:     Cervical back: Normal range of motion and neck supple.  Lymphadenopathy:     Cervical: No cervical adenopathy.  Skin:    General: Skin is warm and dry.     Capillary Refill: Capillary refill takes less than 2 seconds.     Findings: No rash.  Neurological:     General: No focal deficit present.     Mental Status: She is alert and oriented to person, place, and time.     Cranial Nerves: No cranial nerve deficit.     Coordination: Coordination normal.     Deep Tendon Reflexes: Reflexes normal.  Psychiatric:        Mood and Affect: Mood normal.        Behavior: Behavior normal.       Assessment & Plan:   Problem List Items Addressed This Visit       Cardiovascular and Mediastinum   Essential hypertension - Primary     Other    HYPERCHOLESTEROLEMIA   Other Visit Diagnoses     Primary insomnia       Hair loss       Relevant Orders   Ambulatory referral to Dermatology   Need for immunization against influenza       Relevant Orders   Flu Vaccine QUAD High Dose(Fluad) (Completed)       1. Essential hypertension -Blood pressure to goal - Currently taking Norvasc 5 mg, Avapro 150 mg, Bystolic 10 mg - She continues to work on low-salt diet and walking  2. HYPERCHOLESTEROLEMIA -Stable on Crestor 10 mg  3. Primary insomnia -Stable on Ativan 1 mg daily at bedtime  4. Hair loss -Ongoing - Natural thinning versus telogen effluvium -Referral sent to dermatology  5. Need for immunization against influenza -Flu shot updated in office   This note was prepared with assistance of Dragon voice recognition software. Occasional wrong-word or sound-a-like substitutions may have occurred due to the inherent limitations of voice recognition software.  Time Spent: 33 minutes of total time was spent on the date of the encounter performing the following actions: chart review prior to seeing the patient, obtaining history, performing a medically necessary exam, counseling on the treatment plan, placing orders, and documenting in our EHR.    Roldan Laforest M Deion Forgue, PA-C

## 2021-06-23 NOTE — Patient Instructions (Signed)
Good to meet you today. Keep up the good work. See you back in around 6 months for fasting labs and med check. Call for refills. Referral sent to dermatology for hair concerns.  Flu vaccine today.

## 2021-06-25 ENCOUNTER — Other Ambulatory Visit: Payer: Self-pay

## 2021-06-25 ENCOUNTER — Other Ambulatory Visit: Payer: Self-pay | Admitting: Family Medicine

## 2021-07-02 DIAGNOSIS — H2512 Age-related nuclear cataract, left eye: Secondary | ICD-10-CM | POA: Diagnosis not present

## 2021-07-02 DIAGNOSIS — Z961 Presence of intraocular lens: Secondary | ICD-10-CM | POA: Diagnosis not present

## 2021-07-02 DIAGNOSIS — H25012 Cortical age-related cataract, left eye: Secondary | ICD-10-CM | POA: Diagnosis not present

## 2021-07-02 DIAGNOSIS — H25042 Posterior subcapsular polar age-related cataract, left eye: Secondary | ICD-10-CM | POA: Diagnosis not present

## 2021-07-19 DIAGNOSIS — H2512 Age-related nuclear cataract, left eye: Secondary | ICD-10-CM | POA: Diagnosis not present

## 2021-08-06 ENCOUNTER — Ambulatory Visit (INDEPENDENT_AMBULATORY_CARE_PROVIDER_SITE_OTHER): Payer: Medicare Other

## 2021-08-06 ENCOUNTER — Other Ambulatory Visit: Payer: Self-pay

## 2021-08-06 DIAGNOSIS — Z0000 Encounter for general adult medical examination without abnormal findings: Secondary | ICD-10-CM

## 2021-08-06 NOTE — Patient Instructions (Signed)
[name] [name] , Thank you for taking time to come for your Medicare Wellness Visit. I appreciate your ongoing commitment to your health goals. Please review the following plan we discussed and let me know if I can assist you in the future.   Screening recommendations/referrals: Colonoscopy: order placed 08/06/21 Mammogram: Done 6//7/19 pt stated she will call for appt later in the year  Bone Density: Done 01/06/20 repeat every 2 years  Recommended yearly ophthalmology/optometry visit for glaucoma screening and checkup Recommended yearly dental visit for hygiene and checkup  Vaccinations: Influenza vaccine: Done 06/23/21 Pneumococcal vaccine:  due Tdap vaccine: due and discussed  Shingles vaccine: Completed 6/23 & 07/07/21   Covid-19:Completed 3/10, 3/31, 07/31/20 & 6/23, & 07/07/21  Advanced directives: Please bring a copy of your health care power of attorney and living will to the office at your convenience.  Conditions/risks identified: Lose weight   Next appointment: Follow up in one year for your annual wellness visit    Preventive Care 65 Years and Older, Female Preventive care refers to lifestyle choices and visits with your health care provider that can promote health and wellness. What does preventive care include? A yearly physical exam. This is also called an annual well check. Dental exams once or twice a year. Routine eye exams. Ask your health care provider how often you should have your eyes checked. Personal lifestyle choices, including: Daily care of your teeth and gums. Regular physical activity. Eating a healthy diet. Avoiding tobacco and drug use. Limiting alcohol use. Practicing safe sex. Taking low-dose aspirin every day. Taking vitamin and mineral supplements as recommended by your health care provider. What happens during an annual well check? The services and screenings done by your health care provider during your annual well check will depend on your age,  overall health, lifestyle risk factors, and family history of disease. Counseling  Your health care provider may ask you questions about your: Alcohol use. Tobacco use. Drug use. Emotional well-being. Home and relationship well-being. Sexual activity. Eating habits. History of falls. Memory and ability to understand (cognition). Work and work Statistician. Reproductive health. Screening  You may have the following tests or measurements: Height, weight, and BMI. Blood pressure. Lipid and cholesterol levels. These may be checked every 5 years, or more frequently if you are over 29 years old. Skin check. Lung cancer screening. You may have this screening every year starting at age 20 if you have a 30-pack-year history of smoking and currently smoke or have quit within the past 15 years. Fecal occult blood test (FOBT) of the stool. You may have this test every year starting at age 65. Flexible sigmoidoscopy or colonoscopy. You may have a sigmoidoscopy every 5 years or a colonoscopy every 10 years starting at age 53. Hepatitis C blood test. Hepatitis B blood test. Sexually transmitted disease (STD) testing. Diabetes screening. This is done by checking your blood sugar (glucose) after you have not eaten for a while (fasting). You may have this done every 1-3 years. Bone density scan. This is done to screen for osteoporosis. You may have this done starting at age 6. Mammogram. This may be done every 1-2 years. Talk to your health care provider about how often you should have regular mammograms. Talk with your health care provider about your test results, treatment options, and if necessary, the need for more tests. Vaccines  Your health care provider may recommend certain vaccines, such as: Influenza vaccine. This is recommended every year. Tetanus, diphtheria, and acellular  pertussis (Tdap, Td) vaccine. You may need a Td booster every 10 years. Zoster vaccine. You may need this after age  72. Pneumococcal 13-valent conjugate (PCV13) vaccine. One dose is recommended after age 62. Pneumococcal polysaccharide (PPSV23) vaccine. One dose is recommended after age 36. Talk to your health care provider about which screenings and vaccines you need and how often you need them. This information is not intended to replace advice given to you by your health care provider. Make sure you discuss any questions you have with your health care provider. Document Released: 10/02/2015 Document Revised: 05/25/2016 Document Reviewed: 07/07/2015 Elsevier Interactive Patient Education  2017 [place] Prevention in the Home Falls can cause injuries. They can happen to people of all ages. There are many things you can do to make your home safe and to help prevent falls. What can I do on the outside of my home? Regularly fix the edges of walkways and driveways and fix any cracks. Remove anything that might make you trip as you walk through a door, such as a raised step or threshold. Trim any bushes or trees on the path to your home. Use bright outdoor lighting. Clear any walking paths of anything that might make someone trip, such as rocks or tools. Regularly check to see if handrails are loose or broken. Make sure that both sides of any steps have handrails. Any raised decks and porches should have guardrails on the edges. Have any leaves, snow, or ice cleared regularly. Use sand or salt on walking paths during winter. Clean up any spills in your garage right away. This includes oil or grease spills. What can I do in the bathroom? Use night lights. Install grab bars by the toilet and in the tub and shower. Do not use towel bars as grab bars. Use non-skid mats or decals in the tub or shower. If you need to sit down in the shower, use a plastic, non-slip stool. Keep the floor dry. Clean up any water that spills on the floor as soon as it happens. Remove soap buildup in the tub or shower  regularly. Attach bath mats securely with double-sided non-slip rug tape. Do not have throw rugs and other things on the floor that can make you trip. What can I do in the bedroom? Use night lights. Make sure that you have a light by your bed that is easy to reach. Do not use any sheets or blankets that are too big for your bed. They should not hang down onto the floor. Have a firm chair that has side arms. You can use this for support while you get dressed. Do not have throw rugs and other things on the floor that can make you trip. What can I do in the kitchen? Clean up any spills right away. Avoid walking on wet floors. Keep items that you use a lot in easy-to-reach places. If you need to reach something above you, use a strong step stool that has a grab bar. Keep electrical cords out of the way. Do not use floor polish or wax that makes floors slippery. If you must use wax, use non-skid floor wax. Do not have throw rugs and other things on the floor that can make you trip. What can I do with my stairs? Do not leave any items on the stairs. Make sure that there are handrails on both sides of the stairs and use them. Fix handrails that are broken or loose. Make sure that handrails  are as long as the stairways. Check any carpeting to make sure that it is firmly attached to the stairs. Fix any carpet that is loose or worn. Avoid having throw rugs at the top or bottom of the stairs. If you do have throw rugs, attach them to the floor with carpet tape. Make sure that you have a light switch at the top of the stairs and the bottom of the stairs. If you do not have them, ask someone to add them for you. What else can I do to help prevent falls? Wear shoes that: Do not have high heels. Have rubber bottoms. Are comfortable and fit you well. Are closed at the toe. Do not wear sandals. If you use a stepladder: Make sure that it is fully opened. Do not climb a closed stepladder. Make sure that  both sides of the stepladder are locked into place. Ask someone to hold it for you, if possible. Clearly mark and make sure that you can see: Any grab bars or handrails. First and last steps. Where the edge of each step is. Use tools that help you move around (mobility aids) if they are needed. These include: Canes. Walkers. Scooters. Crutches. Turn on the lights when you go into a dark area. Replace any light bulbs as soon as they burn out. Set up your furniture so you have a clear path. Avoid moving your furniture around. If any of your floors are uneven, fix them. If there are any pets around you, be aware of where they are. Review your medicines with your doctor. Some medicines can make you feel dizzy. This can increase your chance of falling. Ask your doctor what other things that you can do to help prevent falls. This information is not intended to replace advice given to you by your health care provider. Make sure you discuss any questions you have with your health care provider. Document Released: 07/02/2009 Document Revised: 02/11/2016 Document Reviewed: 10/10/2014 Elsevier Interactive Patient Education  2017 [employer].

## 2021-08-06 NOTE — Progress Notes (Signed)
Virtual Visit via Telephone Note  I connected with  [name] [name] on 08/06/21 at  2:30 PM EST by telephone and verified that I am speaking with the correct person using two identifiers.  Medicare Annual Wellness visit completed telephonically due to Covid-19 pandemic.   Persons participating in this call: This Health Coach and this patient.   Location: Patient: Home Provider: Office   I discussed the limitations, risks, security and privacy concerns of performing an evaluation and management service by telephone and the availability of in person appointments. The patient expressed understanding and agreed to proceed.  Unable to perform video visit due to video visit attempted and failed and/or patient does not have video capability.   Some vital signs may be absent or patient reported.   Willette Brace, LPN   Subjective:   [name] [name] is a 70 y.o. female who presents for Medicare Annual (Subsequent) preventive examination.  Review of Systems     Cardiac Risk Factors include: advanced age (>13men, >39 women);dyslipidemia;hypertension     Objective:    There were no vitals filed for this visit. There is no height or weight on file to calculate BMI.  Advanced Directives 08/06/2021 12/16/2019 02/22/2016 02/11/2016 12/24/2015 12/15/2015  Does Patient Have a Medical Advance Directive? Yes Yes No No No No  Type of Advance Directive Healthcare Power of [place]  Does patient want to make changes to medical advance directive? - No - Patient declined - - - -  Copy of [place] in Chart? - No - copy requested - - - -  Would patient like information on creating a medical advance directive? - - - No - patient declined information - No - patient declined information    Current Medications (verified) Outpatient Encounter Medications as of 08/06/2021  Medication Sig   amLODipine (NORVASC) 5 MG tablet TAKE 1 TABLET BY  MOUTH EVERY DAY   aspirin EC 81 MG tablet Take 81 mg by mouth daily.   BIOTIN PO Take by mouth.   cholecalciferol (VITAMIN D) 1000 units tablet Take 1,000 Units by mouth daily.   estradiol (ESTRACE) 0.5 MG tablet Take 0.5 mg by mouth daily. Daily for 21 days, then do not take for 7 days   irbesartan (AVAPRO) 150 MG tablet TAKE 1 TABLET BY MOUTH EVERY DAY   LORazepam (ATIVAN) 1 MG tablet Take 1 tablet (1 mg total) by mouth at bedtime. (Patient taking differently: Take 1 mg by mouth. As needed)   nebivolol (BYSTOLIC) 10 MG tablet TAKE 1 TABLET BY MOUTH EVERY DAY   rosuvastatin (CRESTOR) 10 MG tablet TAKE 1 TABLET BY MOUTH EVERY DAY   vitamin E 400 UNIT capsule Take 400 Units by mouth daily.   No facility-administered encounter medications on file as of 08/06/2021.    Allergies (verified) Patient has no known allergies.   History: Past Medical History:  Diagnosis Date   Anxiety    Colonic polyp    FMD (facioscapulohumeral muscular dystrophy) (HCC)    GERD (gastroesophageal reflux disease)    Headache(784.0)    Hypercholesterolemia    Hypertension    Idiopathic urticaria    Varicose veins of lower extremities    Past Surgical History:  Procedure Laterality Date   benign breast biopsy     bilateral breast implants     cataract right eye w/ lens implant     COLONOSCOPY     excision of large anal  polyp     PERIPHERAL VASCULAR CATHETERIZATION N/A 02/11/2016   Procedure: Renal Angiography;  Surgeon: Conrad [place], MD;  Location: [facility];  Service: Cardiovascular;  Laterality: N/A;   POLYPECTOMY     VAGINAL HYSTERECTOMY     varicose vein     Family History  Problem Relation Age of Onset   Hypertension Mother    Hyperlipidemia Mother    Diabetes Mother    Breast cancer Mother    Pulmonary embolism Mother    Deep vein thrombosis Mother    Dementia Father    Sickle cell anemia Cousin    Colon cancer Neg Hx    Esophageal cancer Neg Hx    Stomach cancer Neg Hx     Social History   Socioeconomic History   Marital status: Single    Spouse name: Not on file   Number of children: 0   Years of education: 12   Highest education level: Not on file  Occupational History    Comment: [employer]  Tobacco Use   Smoking status: Never   Smokeless tobacco: Never  Substance and Sexual Activity   Alcohol use: No    Alcohol/week: 0.0 standard drinks   Drug use: No   Sexual activity: Not on file  Other Topics Concern   Not on file  Social History Narrative   Lives alone   No caffeine use   Social Determinants of Health   Financial Resource Strain: Low Risk    Difficulty of Paying Living Expenses: Not hard at all  Food Insecurity: No Food Insecurity   Worried About Charity fundraiser in the Last Year: Never true   Ran Out of Food in the Last Year: Never true  Transportation Needs: No Transportation Needs   Lack of Transportation (Medical): No   Lack of Transportation (Non-Medical): No  Physical Activity: Inactive   Days of Exercise per Week: 0 days   Minutes of Exercise per Session: 0 min  Stress: No Stress Concern Present   Feeling of Stress : Not at all  Social Connections: Socially Isolated   Frequency of Communication with Friends and Family: More than three times a week   Frequency of Social Gatherings with Friends and Family: More than three times a week   Attends Religious Services: Never   Marine scientist or Organizations: No   Attends Music therapist: Never   Marital Status: Never married    Tobacco Counseling Counseling given: Not Answered   Clinical Intake:  Pre-visit preparation completed: Yes  Pain : No/denies pain     BMI - recorded: 24.31 Nutritional Status: BMI of 19-24  Normal Nutritional Risks: None Diabetes: No  How often do you need to have someone help you when you read instructions, pamphlets, or other written materials from your doctor or pharmacy?: 1 - Never  Diabetic?no  Interpreter  Needed?: No  Information entered by :: Charlott Rakes, LPN   Activities of Daily Living In your present state of health, do you have any difficulty performing the following activities: 08/06/2021  Hearing? N  Vision? N  Difficulty concentrating or making decisions? N  Walking or climbing stairs? N  Dressing or bathing? N  Doing errands, shopping? N  Preparing Food and eating ? N  Using the Toilet? N  In the past six months, have you accidently leaked urine? N  Do you have problems with loss of bowel control? N  Managing your Medications? N  Managing your  Finances? N  Housekeeping or managing your Housekeeping? N  Some recent data might be hidden    Patient Care Team: Allwardt, Randa Evens, PA-C as PCP - General (Physician Assistant) Bobbye Charleston, MD as Consulting Physician (Obstetrics and Gynecology)  Indicate any recent Medical Services you may have received from other than [facility] providers in the past year (date may be approximate).     Assessment:   This is a routine wellness examination for [place].  Hearing/Vision screen Hearing Screening - Comments:: Pt denies any hearing issues  Vision Screening - Comments:: Pt follows up with Dr Genevie Ann for eye exam  Dietary issues and exercise activities discussed: Current Exercise Habits: The patient does not participate in regular exercise at present   Goals Addressed             This Visit's Progress    Patient Stated       Lose 20 lbs        Depression Screen PHQ 2/9 Scores 08/06/2021 03/11/2021 07/09/2020 12/16/2019 12/16/2019 12/13/2018 01/01/2013  PHQ - 2 Score 0 0 0 0 0 0 0  PHQ- 9 Score - - - 0 0 - -    Fall Risk Fall Risk  08/06/2021 03/11/2021 07/09/2020 12/16/2019 12/16/2019  Falls in the past year? 0 0 0 0 0  Number falls in past yr: 0 - - 0 -  Injury with Fall? 0 - - 0 -  Risk for fall due to : Impaired balance/gait;Impaired vision No Fall Risks No Fall Risks - -  Follow up Falls prevention discussed - -  Falls evaluation completed;Education provided;Falls prevention discussed -    FALL RISK PREVENTION PERTAINING TO THE HOME:  Any stairs in or around the home? Yes  If so, are there any without handrails? No  Home free of loose throw rugs in walkways, pet beds, electrical cords, etc? Yes  Adequate lighting in your home to reduce risk of falls? Yes   ASSISTIVE DEVICES UTILIZED TO PREVENT FALLS:  Life alert? No  Use of a cane, walker or w/c? No  Grab bars in the bathroom? Yes  Shower chair or bench in shower? No  Elevated toilet seat or a handicapped toilet? No   TIMED UP AND GO:  Was the test performed? No .    Cognitive Function:     6CIT Screen 08/06/2021 12/16/2019  What Year? 0 points 0 points  What month? 0 points 0 points  What time? 0 points 0 points  Count back from 20 0 points 0 points  Months in reverse 0 points 0 points  Repeat phrase 2 points 0 points  Total Score 2 0    Immunizations Immunization History  Administered Date(s) Administered   Fluad Quad(high Dose 65+) 06/17/2019, 07/09/2020, 06/23/2021   Influenza Split 06/13/2011, 07/19/2012   Influenza Whole 07/14/2009, 07/14/2010   Influenza,inj,Quad PF,6+ Mos 08/13/2013, 07/28/2014, 08/25/2015, 05/26/2016, 08/03/2017, 06/21/2018   Influenza-Unspecified 06/19/2016   PFIZER(Purple Top)SARS-COV-2 Vaccination 11/27/2019, 12/18/2019, 07/31/2020, 03/11/2021, 07/07/2021   Pneumococcal Polysaccharide-23 12/13/2018   Td 07/14/2010   Zoster Recombinat (Shingrix) 03/11/2021, 07/07/2021    TDAP status: Due, Education has been provided regarding the importance of this vaccine. Advised may receive this vaccine at local pharmacy or Health Dept. Aware to provide a copy of the vaccination record if obtained from local pharmacy or Health Dept. Verbalized acceptance and understanding.  Flu Vaccine status: Up to date  Pneumococcal vaccine status: Due, Education has been provided regarding the importance of this vaccine.  Advised may receive  this vaccine at local pharmacy or Health Dept. Aware to provide a copy of the vaccination record if obtained from local pharmacy or Health Dept. Verbalized acceptance and understanding.  Covid-19 vaccine status: Completed vaccines  Qualifies for Shingles Vaccine? Yes   Zostavax completed Yes   Shingrix Completed?: Yes  Screening Tests Health Maintenance  Topic Date Due   Pneumonia Vaccine 2+ Years old (2 - PCV) 12/13/2019   MAMMOGRAM  02/24/2020   COLONOSCOPY (Pts 45-26yrs Insurance coverage will need to be confirmed)  07/24/2021   TETANUS/TDAP  06/23/2022 (Originally 07/14/2020)   DEXA SCAN  01/05/2025   INFLUENZA VACCINE  Completed   COVID-19 Vaccine  Completed   Hepatitis C Screening  Completed   Zoster Vaccines- Shingrix  Completed   HPV VACCINES  Aged Out    Health Maintenance  Health Maintenance Due  Topic Date Due   Pneumonia Vaccine 61+ Years old (2 - PCV) 12/13/2019   MAMMOGRAM  02/24/2020   COLONOSCOPY (Pts 45-105yrs Insurance coverage will need to be confirmed)  07/24/2021    Colorectal cancer screening: Referral to GI placed 08/06/21. Pt aware the office will call re: appt.  Mammogram status: Completed 6//7/19. Repeat every year  Bone Density status: Completed 01/06/20. Results reflect: Bone density results: NORMAL. Repeat every 2 years.   Additional Screening:  Hepatitis C Screening:  Completed 01/17/17  Vision Screening: Recommended annual ophthalmology exams for early detection of glaucoma and other disorders of the eye. Is the patient up to date with their annual eye exam?  Yes  Who is the provider or what is the name of the office in which the patient attends annual eye exams? Dr Genevie Ann If pt is not established with a provider, would they like to be referred to a provider to establish care? No .   Dental Screening: Recommended annual dental exams for proper oral hygiene  Community Resource Referral / Chronic Care Management: CRR  required this visit?  No   CCM required this visit?  No      Plan:     I have personally reviewed and noted the following in the patient's chart:   Medical and social history Use of alcohol, tobacco or illicit drugs  Current medications and supplements including opioid prescriptions.  Functional ability and status Nutritional status Physical activity Advanced directives List of other physicians Hospitalizations, surgeries, and ER visits in previous 12 months Vitals Screenings to include cognitive, depression, and falls Referrals and appointments  In addition, I have reviewed and discussed with patient certain preventive protocols, quality metrics, and best practice recommendations. A written personalized care plan for preventive services as well as general preventive health recommendations were provided to patient.     Willette Brace, LPN   [id number]   Nurse Notes: Pt stated that she has been feeling dizzy when bending down and wanted to know if it could be the blood pressure medications she is taking please advise

## 2021-08-20 ENCOUNTER — Other Ambulatory Visit: Payer: Self-pay | Admitting: Family Medicine

## 2021-11-17 ENCOUNTER — Encounter: Payer: Self-pay | Admitting: Gastroenterology

## 2021-12-28 ENCOUNTER — Ambulatory Visit: Payer: Medicare Other | Admitting: Dermatology

## 2022-02-26 ENCOUNTER — Other Ambulatory Visit: Payer: Self-pay | Admitting: Family Medicine

## 2022-02-28 ENCOUNTER — Other Ambulatory Visit: Payer: Self-pay | Admitting: Physician Assistant

## 2022-03-03 DIAGNOSIS — Z1231 Encounter for screening mammogram for malignant neoplasm of breast: Secondary | ICD-10-CM | POA: Diagnosis not present

## 2022-03-03 LAB — HM MAMMOGRAPHY

## 2022-04-26 DIAGNOSIS — Z01419 Encounter for gynecological examination (general) (routine) without abnormal findings: Secondary | ICD-10-CM | POA: Diagnosis not present

## 2022-04-26 DIAGNOSIS — Z1272 Encounter for screening for malignant neoplasm of vagina: Secondary | ICD-10-CM | POA: Diagnosis not present

## 2022-06-23 ENCOUNTER — Other Ambulatory Visit: Payer: Self-pay | Admitting: Physician Assistant

## 2022-07-29 ENCOUNTER — Ambulatory Visit (INDEPENDENT_AMBULATORY_CARE_PROVIDER_SITE_OTHER): Payer: Medicare Other | Admitting: Physician Assistant

## 2022-07-29 VITALS — BP 120/70 | HR 56 | Temp 97.8°F | Ht 63.0 in | Wt 135.8 lb

## 2022-07-29 DIAGNOSIS — Z23 Encounter for immunization: Secondary | ICD-10-CM | POA: Diagnosis not present

## 2022-07-29 DIAGNOSIS — I10 Essential (primary) hypertension: Secondary | ICD-10-CM

## 2022-07-29 DIAGNOSIS — F5101 Primary insomnia: Secondary | ICD-10-CM

## 2022-07-29 DIAGNOSIS — E7800 Pure hypercholesterolemia, unspecified: Secondary | ICD-10-CM | POA: Diagnosis not present

## 2022-07-29 DIAGNOSIS — L659 Nonscarring hair loss, unspecified: Secondary | ICD-10-CM | POA: Insufficient documentation

## 2022-07-29 LAB — LIPID PANEL
Cholesterol: 157 mg/dL (ref 0–200)
HDL: 76.5 mg/dL (ref >39.00)
LDL Cholesterol: 62 mg/dL (ref 0–99)
NonHDL: 80.12
Total CHOL/HDL Ratio: 2
Triglycerides: 90 mg/dL (ref 0.0–149.0)
VLDL: 18 mg/dL (ref 0.0–40.0)

## 2022-07-29 LAB — COMPREHENSIVE METABOLIC PANEL
ALT: 18 U/L (ref 0–35)
AST: 27 U/L (ref 0–37)
Albumin: 4.3 g/dL (ref 3.5–5.2)
Alkaline Phosphatase: 79 U/L (ref 39–117)
BUN: 13 mg/dL (ref 6–23)
CO2: 31 mEq/L (ref 19–32)
Calcium: 9.8 mg/dL (ref 8.4–10.5)
Chloride: 101 mEq/L (ref 96–112)
Creatinine, Ser: 1.06 mg/dL (ref 0.40–1.20)
GFR: 53.65 mL/min — ABNORMAL LOW (ref >60.00)
Glucose, Bld: 79 mg/dL (ref 70–99)
Potassium: 4.5 mEq/L (ref 3.5–5.1)
Sodium: 138 mEq/L (ref 135–145)
Total Bilirubin: 0.4 mg/dL (ref 0.2–1.2)
Total Protein: 7.2 g/dL (ref 6.0–8.3)

## 2022-07-29 LAB — CBC WITH DIFFERENTIAL/PLATELET
Basophils Absolute: 0 10*3/uL (ref 0.0–0.1)
Basophils Relative: 0.5 % (ref 0.0–3.0)
Eosinophils Absolute: 0.1 10*3/uL (ref 0.0–0.7)
Eosinophils Relative: 1.7 % (ref 0.0–5.0)
HCT: 37.9 % (ref 36.0–46.0)
Hemoglobin: 12.5 g/dL (ref 12.0–15.0)
Lymphocytes Relative: 22.6 % (ref 12.0–46.0)
Lymphs Abs: 1.2 10*3/uL (ref 0.7–4.0)
MCHC: 33 g/dL (ref 30.0–36.0)
MCV: 89.4 fl (ref 78.0–100.0)
Monocytes Absolute: 0.6 10*3/uL (ref 0.1–1.0)
Monocytes Relative: 11 % (ref 3.0–12.0)
Neutro Abs: 3.5 10*3/uL (ref 1.4–7.7)
Neutrophils Relative %: 64.2 % (ref 43.0–77.0)
Platelets: 285 10*3/uL (ref 150.0–400.0)
RBC: 4.24 Mil/uL (ref 3.87–5.11)
RDW: 13.3 % (ref 11.5–15.5)
WBC: 5.5 10*3/uL (ref 4.0–10.5)

## 2022-07-29 LAB — TSH: TSH: 2.32 u[IU]/mL (ref 0.35–5.50)

## 2022-07-29 NOTE — Progress Notes (Signed)
Subjective:    Patient ID: [name] [name], female    DOB: 1951/04/22, 71 y.o.   MRN: [id number]  Chief Complaint  Patient presents with   Medication Refill    Pt in office for medication refills and flu vaccine; pt states she was having issues swallowing but no longer having that issue for the past week;     Medication Refill   Patient is in today for medication refill.  My first and only visit with patient was last year 06/23/2021.  She stopped taking Avapro 150 mg in spring this year because pharmacy stopped filling it (she was due for a visit here).  Does not check BP at home. Taking amlodipine and nebivolol.   Sleeps maybe 3-4 hours at a time. Says this is her choice - stays up watching TV, no kids, dog passed away. Frustrated that Lorazepam was discontinued in the past due to increased fall risk, trazodone did not help / made her dizzy.   Past Medical History:  Diagnosis Date   Anxiety    Colonic polyp    FMD (facioscapulohumeral muscular dystrophy) (HCC)    GERD (gastroesophageal reflux disease)    Headache(784.0)    Hypercholesterolemia    Hypertension    Idiopathic urticaria    Varicose veins of lower extremities     Past Surgical History:  Procedure Laterality Date   benign breast biopsy     bilateral breast implants     cataract right eye w/ lens implant     COLONOSCOPY     excision of large anal polyp     PERIPHERAL VASCULAR CATHETERIZATION N/A 02/11/2016   Procedure: Renal Angiography;  Surgeon: Fransisco Hertz, MD;  Location: [facility];  Service: Cardiovascular;  Laterality: N/A;   POLYPECTOMY     VAGINAL HYSTERECTOMY     varicose vein      Family History  Problem Relation Age of Onset   Hypertension Mother    Hyperlipidemia Mother    Diabetes Mother    Breast cancer Mother    Pulmonary embolism Mother    Deep vein thrombosis Mother    Dementia Father    Sickle cell anemia Cousin    Colon cancer Neg Hx    Esophageal cancer Neg Hx     Stomach cancer Neg Hx     Social History   Tobacco Use   Smoking status: Never   Smokeless tobacco: Never  Substance Use Topics   Alcohol use: No    Alcohol/week: 0.0 standard drinks of alcohol   Drug use: No     Allergies  Allergen Reactions   Trazodone And Nefazodone     Dizziness even at low doses    Review of Systems NEGATIVE UNLESS OTHERWISE INDICATED IN HPI      Objective:     BP 120/70 (BP Location: Left Arm)   Pulse (!) 56   Temp 97.8 F (36.6 C) (Temporal)   Ht 5\' 3"  (1.6 m)   Wt 135 lb 12.8 oz (61.6 kg)   LMP  (LMP Unknown)   SpO2 98%   BMI 24.06 kg/m   Wt Readings from Last 3 Encounters:  07/29/22 135 lb 12.8 oz (61.6 kg)  06/23/21 137 lb 3.2 oz (62.2 kg)  03/11/21 138 lb 9.6 oz (62.9 kg)    BP Readings from Last 3 Encounters:  07/29/22 120/70  06/23/21 118/70  03/11/21 (!) 145/79     Physical Exam Vitals and nursing note reviewed. Exam conducted with  a chaperone present.  Constitutional:      Appearance: Normal appearance.  Cardiovascular:     Rate and Rhythm: Regular rhythm. Bradycardia present.     Heart sounds: No murmur heard. Pulmonary:     Effort: Pulmonary effort is normal.     Breath sounds: Normal breath sounds.  Neurological:     General: No focal deficit present.     Mental Status: She is alert and oriented to person, place, and time.  Psychiatric:        Behavior: Behavior normal.     Comments: Somewhat irritated - asked her about what's going on, states that she is here because she has to be        Assessment & Plan:  Essential hypertension Assessment & Plan: Stable, well controlled Amlodipine 5 mg daily Nebivolol 10 mg daily Monitor at home if able Labs today  Orders: -     CBC with Differential/Platelet -     Comprehensive metabolic panel -     Lipid panel -     TSH  HYPERCHOLESTEROLEMIA Assessment & Plan: Rosuvastatin 10 mg daily Lipid panel today Lifestyle modifications   Orders: -     Lipid  panel  Primary insomnia -     TSH  Need for prophylactic vaccination against Streptococcus pneumoniae (pneumococcus) -     Pneumococcal conjugate vaccine 20-valent  Need for immunization against influenza -     Flu Vaccine QUAD High Dose(Fluad)   Discussed sleep concerns with her as well. No longer taking Lorazepam or trazodone. She does not want anything for sleep at this time. Sleep hygiene recommendations placed in AVS.  Recommend regular follow up in 6-12 months. Also due for AWV.    Kadyn Chovan M Claudetta Sallie, PA-C

## 2022-07-29 NOTE — Patient Instructions (Addendum)
Please schedule for Annual Wellness Visit with Otila Kluver, also schedule our next follow-up in 6-12 months.   Pneumonia and flu vaccines updated today.  Labs today.  Monitor Blood pressure at home and keep working on good lifestyle habits.   Please work on the following to help with sleep:  -Sleep only long enough to feel rested then get out of bed -Go to bed and get up at the same time every day. -Do not try to force yourself to sleep. If you can't sleep, get out of bed adn try again later. -Have coffee, tea, and other foods that have caffeine only in the morning. -Avoid alcohol -Keep your bedroom dark, cool, quiet, and free of reminders of work or other things that cause you stress -Exercise several days a week, but not right before bed -Avoid looking at phones or reading devices ("e-books") that give off light before bed. This can make it harder to fall asleep

## 2022-07-29 NOTE — Assessment & Plan Note (Signed)
Rosuvastatin 10 mg daily Lipid panel today Lifestyle modifications

## 2022-07-29 NOTE — Assessment & Plan Note (Signed)
Stable, well controlled Amlodipine 5 mg daily Nebivolol 10 mg daily Monitor at home if able Labs today

## 2022-08-09 ENCOUNTER — Encounter: Payer: Self-pay | Admitting: Physician Assistant

## 2022-08-15 ENCOUNTER — Encounter: Payer: Self-pay | Admitting: Physician Assistant

## 2022-08-19 ENCOUNTER — Ambulatory Visit (INDEPENDENT_AMBULATORY_CARE_PROVIDER_SITE_OTHER): Payer: Medicare Other

## 2022-08-19 VITALS — Wt 135.0 lb

## 2022-08-19 DIAGNOSIS — Z0000 Encounter for general adult medical examination without abnormal findings: Secondary | ICD-10-CM | POA: Diagnosis not present

## 2022-08-19 NOTE — Patient Instructions (Signed)
[name] [name] , Thank you for taking time to come for your Medicare Wellness Visit. I appreciate your ongoing commitment to your health goals. Please review the following plan we discussed and let me know if I can assist you in the future.   These are the goals we discussed:  Goals      Patient Stated     Lose 20 lbs      Patient Stated     Lose 10 lbs         This is a list of the screening recommended for you and due dates:  Health Maintenance  Topic Date Due   DTaP/Tdap/Td vaccine (2 - Tdap) 07/14/2020   COVID-19 Vaccine (7 - 2023-24 season) 05/20/2022   Colon Cancer Screening  07/30/2023*   Mammogram  03/04/2023   Medicare Annual Wellness Visit  08/20/2023   DEXA scan (bone density measurement)  01/05/2025   Pneumonia Vaccine  Completed   Flu Shot  Completed   Hepatitis C Screening: USPSTF Recommendation to screen - Ages 18-79 yo.  Completed   Zoster (Shingles) Vaccine  Completed   HPV Vaccine  Aged Out  *Topic was postponed. The date shown is not the original due date.    Advanced directives: Please bring a copy of your health care power of attorney and living will to the office at your convenience.  Conditions/risks identified: lose weight   Next appointment: Follow up in one year for your annual wellness visit    Preventive Care 65 Years and Older, Female Preventive care refers to lifestyle choices and visits with your health care provider that can promote health and wellness. What does preventive care include? A yearly physical exam. This is also called an annual well check. Dental exams once or twice a year. Routine eye exams. Ask your health care provider how often you should have your eyes checked. Personal lifestyle choices, including: Daily care of your teeth and gums. Regular physical activity. Eating a healthy diet. Avoiding tobacco and drug use. Limiting alcohol use. Practicing safe sex. Taking low-dose aspirin every day. Taking vitamin and mineral  supplements as recommended by your health care provider. What happens during an annual well check? The services and screenings done by your health care provider during your annual well check will depend on your age, overall health, lifestyle risk factors, and family history of disease. Counseling  Your health care provider may ask you questions about your: Alcohol use. Tobacco use. Drug use. Emotional well-being. Home and relationship well-being. Sexual activity. Eating habits. History of falls. Memory and ability to understand (cognition). Work and work Statistician. Reproductive health. Screening  You may have the following tests or measurements: Height, weight, and BMI. Blood pressure. Lipid and cholesterol levels. These may be checked every 5 years, or more frequently if you are over 64 years old. Skin check. Lung cancer screening. You may have this screening every year starting at age 27 if you have a 30-pack-year history of smoking and currently smoke or have quit within the past 15 years. Fecal occult blood test (FOBT) of the stool. You may have this test every year starting at age 74. Flexible sigmoidoscopy or colonoscopy. You may have a sigmoidoscopy every 5 years or a colonoscopy every 10 years starting at age 8. Hepatitis C blood test. Hepatitis B blood test. Sexually transmitted disease (STD) testing. Diabetes screening. This is done by checking your blood sugar (glucose) after you have not eaten for a while (fasting). You may have this done  every 1-3 years. Bone density scan. This is done to screen for osteoporosis. You may have this done starting at age 65. Mammogram. This may be done every 1-2 years. Talk to your health care provider about how often you should have regular mammograms. Talk with your health care provider about your test results, treatment options, and if necessary, the need for more tests. Vaccines  Your health care provider may recommend certain  vaccines, such as: Influenza vaccine. This is recommended every year. Tetanus, diphtheria, and acellular pertussis (Tdap, Td) vaccine. You may need a Td booster every 10 years. Zoster vaccine. You may need this after age 78. Pneumococcal 13-valent conjugate (PCV13) vaccine. One dose is recommended after age 6. Pneumococcal polysaccharide (PPSV23) vaccine. One dose is recommended after age 45. Talk to your health care provider about which screenings and vaccines you need and how often you need them. This information is not intended to replace advice given to you by your health care provider. Make sure you discuss any questions you have with your health care provider. Document Released: 10/02/2015 Document Revised: 05/25/2016 Document Reviewed: 07/07/2015 Elsevier Interactive Patient Education  2017 [employer] Prevention in the Home Falls can cause injuries. They can happen to people of all ages. There are many things you can do to make your home safe and to help prevent falls. What can I do on the outside of my home? Regularly fix the edges of walkways and driveways and fix any cracks. Remove anything that might make you trip as you walk through a door, such as a raised step or threshold. Trim any bushes or trees on the path to your home. Use bright outdoor lighting. Clear any walking paths of anything that might make someone trip, such as rocks or tools. Regularly check to see if handrails are loose or broken. Make sure that both sides of any steps have handrails. Any raised decks and porches should have guardrails on the edges. Have any leaves, snow, or ice cleared regularly. Use sand or salt on walking paths during winter. Clean up any spills in your garage right away. This includes oil or grease spills. What can I do in the bathroom? Use night lights. Install grab bars by the toilet and in the tub and shower. Do not use towel bars as grab bars. Use non-skid mats or decals in  the tub or shower. If you need to sit down in the shower, use a plastic, non-slip stool. Keep the floor dry. Clean up any water that spills on the floor as soon as it happens. Remove soap buildup in the tub or shower regularly. Attach bath mats securely with double-sided non-slip rug tape. Do not have throw rugs and other things on the floor that can make you trip. What can I do in the bedroom? Use night lights. Make sure that you have a light by your bed that is easy to reach. Do not use any sheets or blankets that are too big for your bed. They should not hang down onto the floor. Have a firm chair that has side arms. You can use this for support while you get dressed. Do not have throw rugs and other things on the floor that can make you trip. What can I do in the kitchen? Clean up any spills right away. Avoid walking on wet floors. Keep items that you use a lot in easy-to-reach places. If you need to reach something above you, use a strong step stool that has  a grab bar. Keep electrical cords out of the way. Do not use floor polish or wax that makes floors slippery. If you must use wax, use non-skid floor wax. Do not have throw rugs and other things on the floor that can make you trip. What can I do with my stairs? Do not leave any items on the stairs. Make sure that there are handrails on both sides of the stairs and use them. Fix handrails that are broken or loose. Make sure that handrails are as long as the stairways. Check any carpeting to make sure that it is firmly attached to the stairs. Fix any carpet that is loose or worn. Avoid having throw rugs at the top or bottom of the stairs. If you do have throw rugs, attach them to the floor with carpet tape. Make sure that you have a light switch at the top of the stairs and the bottom of the stairs. If you do not have them, ask someone to add them for you. What else can I do to help prevent falls? Wear shoes that: Do not have high  heels. Have rubber bottoms. Are comfortable and fit you well. Are closed at the toe. Do not wear sandals. If you use a stepladder: Make sure that it is fully opened. Do not climb a closed stepladder. Make sure that both sides of the stepladder are locked into place. Ask someone to hold it for you, if possible. Clearly mark and make sure that you can see: Any grab bars or handrails. First and last steps. Where the edge of each step is. Use tools that help you move around (mobility aids) if they are needed. These include: Canes. Walkers. Scooters. Crutches. Turn on the lights when you go into a dark area. Replace any light bulbs as soon as they burn out. Set up your furniture so you have a clear path. Avoid moving your furniture around. If any of your floors are uneven, fix them. If there are any pets around you, be aware of where they are. Review your medicines with your doctor. Some medicines can make you feel dizzy. This can increase your chance of falling. Ask your doctor what other things that you can do to help prevent falls. This information is not intended to replace advice given to you by your health care provider. Make sure you discuss any questions you have with your health care provider. Document Released: 07/02/2009 Document Revised: 02/11/2016 Document Reviewed: 10/10/2014 Elsevier Interactive Patient Education  2017 [employer].

## 2022-08-19 NOTE — Progress Notes (Signed)
I connected with  Porfirio Mylar on 08/19/22 by a audio enabled telemedicine application and verified that I am speaking with the correct person using two identifiers.  Patient Location: Home  Provider Location: Office/Clinic  I discussed the limitations of evaluation and management by telemedicine. The patient expressed understanding and agreed to proceed.   Subjective:   [name] [name] is a 71 y.o. female who presents for Medicare Annual (Subsequent) preventive examination.  Review of Systems     Cardiac Risk Factors include: advanced age (>77mn, >>18women);hypertension;dyslipidemia     Objective:    Today's Vitals   08/19/22 1431  Weight: 135 lb (61.2 kg)   Body mass index is 23.91 kg/m.     08/19/2022    2:36 PM 08/06/2021    2:48 PM 12/16/2019    3:00 PM 02/22/2016   10:46 AM 02/11/2016   10:47 AM 12/24/2015    1:26 PM 12/15/2015   10:00 AM  Advanced Directives  Does Patient Have a Medical Advance Directive? Yes Yes Yes No No No No  [place]     Does patient want to make changes to medical advance directive?   No - Patient declined      Copy of HB[place] Chart? No - copy requested  No - copy requested      Would patient like information on creating a medical advance directive?     No - patient declined information  No - patient declined information    Current Medications (verified) Outpatient Encounter Medications as of 08/19/2022  Medication Sig   amLODipine (NORVASC) 5 MG tablet TAKE 1 TABLET BY MOUTH EVERY DAY   aspirin EC 81 MG tablet Take 81 mg by mouth daily.   BIOTIN PO Take by mouth.   cholecalciferol (VITAMIN D) 1000 units tablet Take 1,000 Units by mouth daily.   estradiol (ESTRACE) 0.5 MG tablet Take 0.5 mg by mouth daily. Daily for 21 days, then do not take for 7 days   nebivolol (BYSTOLIC) 10 MG tablet TAKE  1 TABLET BY MOUTH EVERY DAY   rosuvastatin (CRESTOR) 10 MG tablet TAKE 1 TABLET BY MOUTH EVERY DAY   vitamin E 400 UNIT capsule Take 400 Units by mouth daily.   No facility-administered encounter medications on file as of 08/19/2022.    Allergies (verified) Trazodone and nefazodone   History: Past Medical History:  Diagnosis Date   Anxiety    Colonic polyp    FMD (facioscapulohumeral muscular dystrophy) (HCC)    GERD (gastroesophageal reflux disease)    Headache(784.0)    Hypercholesterolemia    Hypertension    Idiopathic urticaria    Varicose veins of lower extremities    Past Surgical History:  Procedure Laterality Date   benign breast biopsy     bilateral breast implants     cataract right eye w/ lens implant     COLONOSCOPY     excision of large anal polyp     PERIPHERAL VASCULAR CATHETERIZATION N/A 02/11/2016   Procedure: Renal Angiography;  Surgeon: BConrad Convent MD;  [facility];  Service: Cardiovascular;  Laterality: N/A;   POLYPECTOMY     VAGINAL HYSTERECTOMY     varicose vein     Family History  Problem Relation Age of Onset   Hypertension Mother    Hyperlipidemia Mother    Diabetes Mother  Breast cancer Mother    Pulmonary embolism Mother    Deep vein thrombosis Mother    Dementia Father    Sickle cell anemia Cousin    Colon cancer Neg Hx    Esophageal cancer Neg Hx    Stomach cancer Neg Hx    Social History   Socioeconomic History   Marital status: Single    Spouse name: Not on file   Number of children: 0   Years of education: 12   Highest education level: Not on file  Occupational History    Comment: [employer]  Tobacco Use   Smoking status: Never   Smokeless tobacco: Never  Substance and Sexual Activity   Alcohol use: No    Alcohol/week: 0.0 standard drinks of alcohol   Drug use: No   Sexual activity: Not on file  Other Topics Concern   Not on file  Social History Narrative   Lives alone   No caffeine use   Social  Determinants of Health   Financial Resource Strain: Low Risk  (08/19/2022)   Overall Financial Resource Strain (CARDIA)    Difficulty of Paying Living Expenses: Not hard at all  Food Insecurity: No Food Insecurity (08/19/2022)   Hunger Vital Sign    Worried About Running Out of Food in the Last Year: Never true    Ran Out of Food in the Last Year: Never true  Transportation Needs: No Transportation Needs (08/19/2022)   PRAPARE - Hydrologist (Medical): No    Lack of Transportation (Non-Medical): No  Physical Activity: Inactive (08/19/2022)   Exercise Vital Sign    Days of Exercise per Week: 0 days    Minutes of Exercise per Session: 0 min  Stress: No Stress Concern Present (08/19/2022)   [place]    Feeling of Stress : Not at all  Social Connections: Socially Isolated (08/19/2022)   Social Connection and Isolation Panel [NHANES]    Frequency of Communication with Friends and Family: More than three times a week    Frequency of Social Gatherings with Friends and Family: Once a week    Attends Religious Services: Never    Marine scientist or Organizations: No    Attends Music therapist: Never    Marital Status: Divorced    Tobacco Counseling Counseling given: Not Answered   Clinical Intake:  Pre-visit preparation completed: Yes  Pain : No/denies pain     BMI - recorded: 23.91 Nutritional Status: BMI of 19-24  Normal Nutritional Risks: None Diabetes: No  How often do you need to have someone help you when you read instructions, pamphlets, or other written materials from your doctor or pharmacy?: 1 - Never  Diabetic?no  Interpreter Needed?: No  Information entered by :: Charlott Rakes, LPN   Activities of Daily Living    08/19/2022    2:38 PM  In your present state of health, do you have any difficulty performing the following activities:  Hearing? 1   Comment tinnitus  Vision? 0  Difficulty concentrating or making decisions? 0  Walking or climbing stairs? 0  Dressing or bathing? 0  Doing errands, shopping? 0  Preparing Food and eating ? N  Using the Toilet? N  In the past six months, have you accidently leaked urine? N  Do you have problems with loss of bowel control? N  Managing your Medications? N  Managing your Finances? N  Housekeeping or managing  your Housekeeping? N    Patient Care Team: Allwardt, Randa Evens, PA-C as PCP - General (Physician Assistant) Bobbye Charleston, MD as Consulting Physician (Obstetrics and Gynecology)  Indicate any recent Medical Services you may have received from other than [facility] providers in the past year (date may be approximate).     Assessment:   This is a routine wellness examination for Mykah.  Hearing/Vision screen Hearing Screening - Comments:: Pt has tinnitus  Vision Screening - Comments:: Pt follows up with Dr Genevie Ann for annul eye exams [place] [facility]   Dietary issues and exercise activities discussed: Current Exercise Habits: The patient does not participate in regular exercise at present   Goals Addressed             This Visit's Progress    Patient Stated       Lose 10 lbs        Depression Screen    08/19/2022    2:35 PM 08/06/2021    2:45 PM 03/11/2021    3:33 PM 07/09/2020    2:07 PM 12/16/2019    3:01 PM 12/16/2019    1:20 PM 12/13/2018    1:34 PM  PHQ 2/9 Scores  PHQ - 2 Score 0 0 0 0 0 0 0  PHQ- 9 Score     0 0     Fall Risk    08/19/2022    2:38 PM 08/06/2021    2:51 PM 03/11/2021    3:33 PM 07/09/2020    2:07 PM 12/16/2019    3:01 PM  [place] in the past year? 0 0 0 0 0  Number falls in past yr: 0 0   0  Injury with Fall? 0 0   0  Risk for fall due to : Impaired vision Impaired balance/gait;Impaired vision No Fall Risks No Fall Risks   Follow up Falls prevention discussed Falls prevention discussed   Falls evaluation completed;Education  provided;Falls prevention discussed    FALL RISK PREVENTION PERTAINING TO THE HOME:  Any stairs in or around the home? Yes  If so, are there any without handrails? No  Home free of loose throw rugs in walkways, pet beds, electrical cords, etc? Yes  Adequate lighting in your home to reduce risk of falls? Yes   ASSISTIVE DEVICES UTILIZED TO PREVENT FALLS:  Life alert? No  Use of a cane, walker or w/c? No  Grab bars in the bathroom? Yes  Shower chair or bench in shower? Yes  Elevated toilet seat or a handicapped toilet? Yes   TIMED UP AND GO:  Was the test performed? No .   Cognitive Function:        08/19/2022    2:38 PM 08/06/2021    2:53 PM 12/16/2019    3:00 PM  6CIT Screen  What Year? 0 points 0 points 0 points  What month? 0 points 0 points 0 points  What time? 0 points 0 points 0 points  Count back from 20 0 points 0 points 0 points  Months in reverse 0 points 0 points 0 points  Repeat phrase 0 points 2 points 0 points  Total Score 0 points 2 points 0 points    Immunizations Immunization History  Administered Date(s) Administered   Fluad Quad(high Dose 65+) 06/17/2019, 07/09/2020, 06/23/2021, 07/29/2022   Influenza Split 06/13/2011, 07/19/2012   Influenza Whole 07/14/2009, 07/14/2010   Influenza,inj,Quad PF,6+ Mos 08/13/2013, 07/28/2014, 08/25/2015, 05/26/2016, 08/03/2017, 06/21/2018   Influenza-Unspecified 06/19/2016   PFIZER(Purple Top)SARS-COV-2  Vaccination 11/27/2019, 12/18/2019, 07/31/2020, 03/11/2021, 07/07/2021   PNEUMOCOCCAL CONJUGATE-20 07/29/2022   Pfizer Covid-19 Vaccine Bivalent Booster 55yr & up 02/23/2022   Pneumococcal Polysaccharide-23 12/13/2018   Td 07/14/2010   Zoster Recombinat (Shingrix) 03/11/2021, 07/07/2021    TDAP status: Due, Education has been provided regarding the importance of this vaccine. Advised may receive this vaccine at local pharmacy or Health Dept. Aware to provide a copy of the vaccination record if obtained from local  pharmacy or Health Dept. Verbalized acceptance and understanding.  Flu Vaccine status: Up to date  Pneumococcal vaccine status: Up to date  Covid-19 vaccine status: Completed vaccines  Qualifies for Shingles Vaccine? Yes   Zostavax completed Yes   Shingrix Completed?: Yes  Screening Tests Health Maintenance  Topic Date Due   DTaP/Tdap/Td (2 - Tdap) 07/14/2020   COVID-19 Vaccine (7 - 2023-24 season) 05/20/2022   COLONOSCOPY (Pts [phone] coverage will need to be confirmed)  07/30/2023 (Originally 07/24/2021)   MAMMOGRAM  03/04/2023   Medicare Annual Wellness (AWV)  08/20/2023   DEXA SCAN  01/05/2025   Pneumonia Vaccine 6531Years old  Completed   INFLUENZA VACCINE  Completed   Hepatitis C Screening  Completed   Zoster Vaccines- Shingrix  Completed   HPV VACCINES  Aged Out    Health Maintenance  Health Maintenance Due  Topic Date Due   DTaP/Tdap/Td (2 - Tdap) 07/14/2020   COVID-19 Vaccine (7 - 2023-24 season) 05/20/2022    Colorectal cancer screening: Type of screening: Colonoscopy. Completed 07/25/11. Repeat every 10 years pt stated she will call to set up   Mammogram status: Completed 03/03/22. Repeat every year  Bone Density status: Completed 01/06/20. Results reflect: Bone density results: NORMAL. Repeat every 5 years.   Additional Screening:  Hepatitis C Screening:  Completed 01/17/17  Vision Screening: Recommended annual ophthalmology exams for early detection of glaucoma and other disorders of the eye. Is the patient up to date with their annual eye exam?  Yes  Who is the provider or what is the name of the office in which the patient attends [facility] exams[facility]  If pt is not established with a provider, would they like to be referred to a provider to establish care? No .   Dental Screening: Recommended annual dental exams for proper oral hygiene  Community Resource Referral / Chronic Care Management: CRR required this visit?  No   CCM required  this visit?  No      Plan:     I have personally reviewed and noted the following in the patient's chart:   Medical and social history Use of alcohol, tobacco or illicit drugs  Current medications and supplements including opioid prescriptions. Patient is not currently taking opioid prescriptions. Functional ability and status Nutritional status Physical activity Advanced directives List of other physicians Hospitalizations, surgeries, and ER visits in previous 12 months Vitals Screenings to include cognitive, depression, and falls Referrals and appointments  In addition, I have reviewed and discussed with patient certain preventive protocols, quality metrics, and best practice recommendations. A written personalized care plan for preventive services as well as general preventive health recommendations were provided to patient.     TiWillette BraceLPN   [id number] Nurse Notes: none

## 2022-08-30 ENCOUNTER — Other Ambulatory Visit: Payer: Self-pay | Admitting: Physician Assistant

## 2022-09-01 ENCOUNTER — Other Ambulatory Visit: Payer: Self-pay | Admitting: Physician Assistant

## 2022-10-18 DIAGNOSIS — Z23 Encounter for immunization: Secondary | ICD-10-CM | POA: Diagnosis not present

## 2023-01-25 ENCOUNTER — Ambulatory Visit: Payer: Medicare Other | Admitting: Physician Assistant

## 2023-01-30 ENCOUNTER — Encounter: Payer: Self-pay | Admitting: Physician Assistant

## 2023-01-30 ENCOUNTER — Ambulatory Visit (INDEPENDENT_AMBULATORY_CARE_PROVIDER_SITE_OTHER): Payer: Medicare Other | Admitting: Physician Assistant

## 2023-01-30 VITALS — BP 120/62 | HR 57 | Temp 97.8°F | Ht 63.0 in | Wt 137.8 lb

## 2023-01-30 DIAGNOSIS — I10 Essential (primary) hypertension: Secondary | ICD-10-CM | POA: Diagnosis not present

## 2023-01-30 DIAGNOSIS — J310 Chronic rhinitis: Secondary | ICD-10-CM | POA: Diagnosis not present

## 2023-01-30 NOTE — Progress Notes (Unsigned)
Subjective:    Patient ID: [name] [name], female    DOB: November 08, 1950, 72 y.o.   MRN: [id number]  Chief Complaint  Patient presents with   Hypertension   Hyperlipidemia    HPI Patient is in today for recheck HTN, HLD.  Doing well, no major medical changes in the interim.  No headaches or dizziness. Still has occasional ringing in the ears. No CP or palpitations. No cough or wheeze. No SOB. No swelling in the legs. Still having runny nose when she eats.   Enjoys puzzling, 1000+ pieces. Misses her dog that passed away, considering getting a new dog.   Past Medical History:  Diagnosis Date   Anxiety    Colonic polyp    FMD (facioscapulohumeral muscular dystrophy) (HCC)    GERD (gastroesophageal reflux disease)    Headache(784.0)    Hypercholesterolemia    Hypertension    Idiopathic urticaria    Varicose veins of lower extremities     Past Surgical History:  Procedure Laterality Date   benign breast biopsy     bilateral breast implants     cataract right eye w/ lens implant     COLONOSCOPY     excision of large anal polyp     PERIPHERAL VASCULAR CATHETERIZATION N/A 02/11/2016   Procedure: Renal Angiography;  Surgeon: Fransisco Hertz, MD;  Location: [facility];  Service: Cardiovascular;  Laterality: N/A;   POLYPECTOMY     VAGINAL HYSTERECTOMY     varicose vein      Family History  Problem Relation Age of Onset   Hypertension Mother    Hyperlipidemia Mother    Diabetes Mother    Breast cancer Mother    Pulmonary embolism Mother    Deep vein thrombosis Mother    Dementia Father    Sickle cell anemia Cousin    Colon cancer Neg Hx    Esophageal cancer Neg Hx    Stomach cancer Neg Hx     Social History   Tobacco Use   Smoking status: Never   Smokeless tobacco: Never  Substance Use Topics   Alcohol use: No    Alcohol/week: 0.0 standard drinks of alcohol   Drug use: No     Allergies  Allergen Reactions   Trazodone And Nefazodone     Dizziness even at  low doses    Review of Systems NEGATIVE UNLESS OTHERWISE INDICATED IN HPI      Objective:     BP 120/62   Pulse (!) 57   Temp 97.8 F (36.6 C)   Ht 5\' 3"  (1.6 m)   Wt 137 lb 12.8 oz (62.5 kg)   LMP  (LMP Unknown)   SpO2 96%   BMI 24.41 kg/m   Wt Readings from Last 3 Encounters:  01/30/23 137 lb 12.8 oz (62.5 kg)  08/19/22 135 lb (61.2 kg)  07/29/22 135 lb 12.8 oz (61.6 kg)    BP Readings from Last 3 Encounters:  01/30/23 120/62  07/29/22 120/70  06/23/21 118/70     Physical Exam Vitals and nursing note reviewed.  Constitutional:      Appearance: Normal appearance. She is normal weight. She is not toxic-appearing.  HENT:     Head: Normocephalic and atraumatic.     Right Ear: Tympanic membrane, ear canal and external ear normal.     Left Ear: Tympanic membrane, ear canal and external ear normal.     Nose: Nose normal.     Mouth/Throat:  Mouth: Mucous membranes are moist.  Eyes:     Extraocular Movements: Extraocular movements intact.     Conjunctiva/sclera: Conjunctivae normal.     Pupils: Pupils are equal, round, and reactive to light.  Cardiovascular:     Rate and Rhythm: Regular rhythm. Bradycardia present.     Pulses: Normal pulses.     Heart sounds: Normal heart sounds.  Pulmonary:     Effort: Pulmonary effort is normal.     Breath sounds: Normal breath sounds.  Musculoskeletal:        General: Normal range of motion.     Cervical back: Normal range of motion and neck supple.     Right lower leg: No edema.     Left lower leg: No edema.  Skin:    General: Skin is warm and dry.  Neurological:     General: No focal deficit present.     Mental Status: She is alert and oriented to person, place, and time.  Psychiatric:        Mood and Affect: Mood normal.        Behavior: Behavior normal.        Assessment & Plan:  Essential hypertension Assessment & Plan: Stable, well controlled Amlodipine 5 mg daily Nebivolol 10 mg daily Monitor at  home Keep working on staying active, healthy eating   Gustatory rhinitis Assessment & Plan: Trial ipratropium nasal spray, two spray each nostril BID  Orders: -     Ipratropium Bromide; Place 2 sprays into both nostrils 2 (two) times daily.  Dispense: 35.52 mL; Refill: 2        Return in about 1 year (around 01/30/2024) for recheck/follow-up.     English Tomer M Dessie Tatem, PA-C

## 2023-01-31 MED ORDER — IPRATROPIUM BROMIDE 0.06 % NA SOLN
2.0000 | Freq: Two times a day (BID) | NASAL | 2 refills | Status: DC
Start: 2023-01-31 — End: 2023-03-20

## 2023-01-31 NOTE — Assessment & Plan Note (Signed)
Stable, well controlled Amlodipine 5 mg daily Nebivolol 10 mg daily Monitor at home Keep working on staying active, healthy eating

## 2023-01-31 NOTE — Assessment & Plan Note (Signed)
Trial ipratropium nasal spray, two spray each nostril BID

## 2023-02-10 ENCOUNTER — Encounter: Payer: Self-pay | Admitting: Gastroenterology

## 2023-02-15 DIAGNOSIS — Z780 Asymptomatic menopausal state: Secondary | ICD-10-CM | POA: Insufficient documentation

## 2023-02-23 ENCOUNTER — Telehealth: Payer: Self-pay

## 2023-02-23 ENCOUNTER — Ambulatory Visit (AMBULATORY_SURGERY_CENTER): Payer: Medicare Other

## 2023-02-23 ENCOUNTER — Other Ambulatory Visit: Payer: Self-pay | Admitting: Physician Assistant

## 2023-02-23 VITALS — Ht 63.0 in | Wt 137.0 lb

## 2023-02-23 DIAGNOSIS — Z1211 Encounter for screening for malignant neoplasm of colon: Secondary | ICD-10-CM

## 2023-02-23 MED ORDER — NA SULFATE-K SULFATE-MG SULF 17.5-3.13-1.6 GM/177ML PO SOLN
1.0000 | Freq: Once | ORAL | 0 refills | Status: AC
Start: 2023-02-23 — End: 2023-02-23

## 2023-02-23 NOTE — Progress Notes (Signed)
No egg or soy allergy known to patient  No issues known to pt with past sedation with any surgeries or procedures Patient denies ever being told they had issues or difficulty with intubation  No FH of Malignant Hyperthermia Pt is not on diet pills Pt is not on  home 02  Pt is not on blood thinners  Pt denies issues with constipation  No A fib or A flutter Have any cardiac testing pending--no  Pt is ambulatory   Patient's chart reviewed by Cathlyn Parsons CNRA prior to previsit and patient appropriate for the [facility].  Previsit completed and red dot placed by patient's name on their procedure day (on provider's schedule).     PV completed. Prep reviewed and sent to home address. Pt advised to read all the highlighted areas once packet is received and call the office back if there are any questions. Good rx coupon for [facility] provided,  Pt instructed to use Singlecare.com or GoodRx for a price reduction on prep

## 2023-02-24 NOTE — Telephone Encounter (Signed)
PV completed.  °

## 2023-03-08 DIAGNOSIS — Z1231 Encounter for screening mammogram for malignant neoplasm of breast: Secondary | ICD-10-CM | POA: Diagnosis not present

## 2023-03-20 ENCOUNTER — Encounter: Payer: Self-pay | Admitting: Gastroenterology

## 2023-03-20 ENCOUNTER — Ambulatory Visit (AMBULATORY_SURGERY_CENTER): Payer: Medicare Other | Admitting: Gastroenterology

## 2023-03-20 VITALS — BP 121/49 | HR 70 | Temp 98.0°F | Resp 14 | Ht 63.0 in | Wt 137.0 lb

## 2023-03-20 DIAGNOSIS — K640 First degree hemorrhoids: Secondary | ICD-10-CM | POA: Diagnosis not present

## 2023-03-20 DIAGNOSIS — K5730 Diverticulosis of large intestine without perforation or abscess without bleeding: Secondary | ICD-10-CM

## 2023-03-20 DIAGNOSIS — F419 Anxiety disorder, unspecified: Secondary | ICD-10-CM | POA: Diagnosis not present

## 2023-03-20 DIAGNOSIS — I10 Essential (primary) hypertension: Secondary | ICD-10-CM | POA: Diagnosis not present

## 2023-03-20 DIAGNOSIS — Z1211 Encounter for screening for malignant neoplasm of colon: Secondary | ICD-10-CM | POA: Diagnosis not present

## 2023-03-20 DIAGNOSIS — E7800 Pure hypercholesterolemia, unspecified: Secondary | ICD-10-CM | POA: Diagnosis not present

## 2023-03-20 DIAGNOSIS — D124 Benign neoplasm of descending colon: Secondary | ICD-10-CM

## 2023-03-20 HISTORY — PX: COLONOSCOPY: SHX174

## 2023-03-20 MED ORDER — SODIUM CHLORIDE 0.9 % IV SOLN
500.0000 mL | Freq: Once | INTRAVENOUS | Status: DC
Start: 2023-03-20 — End: 2023-07-12

## 2023-03-20 NOTE — Progress Notes (Signed)
Pt's states no medical or surgical changes since previsit or office visit. VS assessed by D.T 

## 2023-03-20 NOTE — Patient Instructions (Signed)
Please read handouts provided. Continue present medications. Await pathology results. Return to GI clinic as needed.   YOU HAD AN ENDOSCOPIC PROCEDURE TODAY AT THE [facility]:   Refer to the procedure report that was given to you for any specific questions about what was found during the examination.  If the procedure report does not answer your questions, please call your gastroenterologist to clarify.  If you requested that your care partner not be given the details of your procedure findings, then the procedure report has been included in a sealed envelope for you to review at your convenience later.  YOU SHOULD EXPECT: Some feelings of bloating in the abdomen. Passage of more gas than usual.  Walking can help get rid of the air that was put into your GI tract during the procedure and reduce the bloating. If you had a lower endoscopy (such as a colonoscopy or flexible sigmoidoscopy) you may notice spotting of blood in your stool or on the toilet paper. If you underwent a bowel prep for your procedure, you may not have a normal bowel movement for a few days.  Please Note:  You might notice some irritation and congestion in your nose or some drainage.  This is from the oxygen used during your procedure.  There is no need for concern and it should clear up in a day or so.  SYMPTOMS TO REPORT IMMEDIATELY:  Following lower endoscopy (colonoscopy or flexible sigmoidoscopy):  Excessive amounts of blood in the stool  Significant tenderness or worsening of abdominal pains  Swelling of the abdomen that is new, acute  Fever of 100F or higher.  For urgent or emergent issues, a gastroenterologist can be reached at any hour by calling [phone]. Do not use MyChart messaging for urgent concerns.    DIET:  We do recommend a small meal at first, but then you may proceed to your regular diet.  Drink plenty of fluids but you should avoid alcoholic beverages for 24 hours.  ACTIVITY:  You  should plan to take it easy for the rest of today and you should NOT DRIVE or use heavy machinery until tomorrow (because of the sedation medicines used during the test).    FOLLOW UP: Our staff will call the number listed on your records the next business day following your procedure.  We will call around 7:15- 8:00 am to check on you and address any questions or concerns that you may have regarding the information given to you following your procedure. If we do not reach you, we will leave a message.     If any biopsies were taken you will be contacted by phone or by letter within the next 1-3 weeks.  Please call us at [phone] if you have not heard about the biopsies in 3 weeks.    SIGNATURES/CONFIDENTIALITY: You and/or your care partner have signed paperwork which will be entered into your electronic medical record.  These signatures attest to the fact that that the information above on your After Visit Summary has been reviewed and is understood.  Full responsibility of the confidentiality of this discharge information lies with you and/or your care-partner.  

## 2023-03-20 NOTE — Progress Notes (Signed)
GASTROENTEROLOGY PROCEDURE H&P NOTE   Primary Care Physician: Allwardt, Crist Infante, PA-C    Reason for Procedure:  Colon Cancer screening  Plan:    Colonoscopy  Patient is appropriate for endoscopic procedure(s) in the ambulatory ([facility]) setting.  The nature of the procedure, as well as the risks, benefits, and alternatives were carefully and thoroughly reviewed with the patient. Ample time for discussion and questions allowed. The patient understood, was satisfied, and agreed to proceed.     HPI: [name] [name] is a 72 y.o. female who presents for colonoscopy for routine Colon Cancer screening.  No active GI symptoms.  No known family history of colon cancer or related malignancy.  Patient is otherwise without complaints or active issues today.  Last colonoscopy was 07/2011 and normal/no polyps.  Past Medical History:  Diagnosis Date   Anxiety    Colonic polyp    FMD (facioscapulohumeral muscular dystrophy) (HCC)    GERD (gastroesophageal reflux disease)    Headache(784.0)    Hypercholesterolemia    Hypertension    Idiopathic urticaria    Varicose veins of lower extremities     Past Surgical History:  Procedure Laterality Date   benign breast biopsy     bilateral breast implants     cataract right eye w/ lens implant     COLONOSCOPY     excision of large anal polyp     PERIPHERAL VASCULAR CATHETERIZATION N/A 02/11/2016   Procedure: Renal Angiography;  Surgeon: Fransisco Hertz, MD;  Location: [facility];  Service: Cardiovascular;  Laterality: N/A;   POLYPECTOMY     VAGINAL HYSTERECTOMY     varicose vein      Prior to Admission medications   Medication Sig Start Date End Date Taking? Authorizing Provider  amLODipine (NORVASC) 5 MG tablet TAKE 1 TABLET BY MOUTH EVERY DAY 02/23/23  Yes Allwardt, Alyssa M, PA-C  aspirin EC 81 MG tablet Take 81 mg by mouth daily.   Yes [provider]  BIOTIN PO Take by mouth.   Yes [provider]   cholecalciferol (VITAMIN D) 1000 units tablet Take 1,000 Units by mouth daily.   Yes [provider]  estradiol (ESTRACE) 0.5 MG tablet Take 0.5 mg by mouth daily. Daily for 21 days, then do not take for 7 days   Yes [provider]  nebivolol (BYSTOLIC) 10 MG tablet TAKE 1 TABLET BY MOUTH EVERY DAY 09/02/22  Yes Allwardt, Alyssa M, PA-C  rosuvastatin (CRESTOR) 10 MG tablet TAKE 1 TABLET BY MOUTH EVERY DAY 02/28/22  Yes Allwardt, Alyssa M, PA-C  vitamin E 400 UNIT capsule Take 400 Units by mouth daily.   Yes [provider]    Current Outpatient Medications  Medication Sig Dispense Refill   amLODipine (NORVASC) 5 MG tablet TAKE 1 TABLET BY MOUTH EVERY DAY 90 tablet 1   aspirin EC 81 MG tablet Take 81 mg by mouth daily.     BIOTIN PO Take by mouth.     cholecalciferol (VITAMIN D) 1000 units tablet Take 1,000 Units by mouth daily.     estradiol (ESTRACE) 0.5 MG tablet Take 0.5 mg by mouth daily. Daily for 21 days, then do not take for 7 days     nebivolol (BYSTOLIC) 10 MG tablet TAKE 1 TABLET BY MOUTH EVERY DAY 90 tablet 3   rosuvastatin (CRESTOR) 10 MG tablet TAKE 1 TABLET BY MOUTH EVERY DAY 90 tablet 3   vitamin E 400 UNIT capsule Take 400 Units  by mouth daily.     Current Facility-Administered Medications  Medication Dose Route Frequency Provider Last Rate Last Admin   0.9 %  sodium chloride infusion  500 mL Intravenous Once Elspeth Blucher V, DO        Allergies as of 03/20/2023 - Review Complete 03/20/2023  Allergen Reaction Noted   Trazodone and nefazodone  07/29/2022    Family History  Problem Relation Age of Onset   Hypertension Mother    Hyperlipidemia Mother    Diabetes Mother    Breast cancer Mother    Pulmonary embolism Mother    Deep vein thrombosis Mother    Dementia Father    Sickle cell anemia Cousin    Colon cancer Neg Hx    Esophageal cancer Neg Hx    Stomach cancer Neg Hx    Colon polyps Neg Hx    Rectal cancer Neg Hx      Social History   Socioeconomic History   Marital status: Single    Spouse name: Not on file   Number of children: 0   Years of education: 12   Highest education level: Not on file  Occupational History    Comment: [employer]  Tobacco Use   Smoking status: Never   Smokeless tobacco: Never  Vaping Use   Vaping Use: Never used  Substance and Sexual Activity   Alcohol use: No    Alcohol/week: 0.0 standard drinks of alcohol   Drug use: No   Sexual activity: Not on file  Other Topics Concern   Not on file  Social History Narrative   Lives alone   No caffeine use   Social Determinants of Health   Financial Resource Strain: Low Risk  (08/19/2022)   Overall Financial Resource Strain (CARDIA)    Difficulty of Paying Living Expenses: Not hard at all  Food Insecurity: No Food Insecurity (08/19/2022)   Hunger Vital Sign    Worried About Running Out of Food in the Last Year: Never true    Ran Out of Food in the Last Year: Never true  Transportation Needs: No Transportation Needs (08/19/2022)   PRAPARE - Administrator, Civil Service (Medical): No    Lack of Transportation (Non-Medical): No  Physical Activity: Inactive (08/19/2022)   Exercise Vital Sign    Days of Exercise per Week: 0 days    Minutes of Exercise per Session: 0 min  Stress: No Stress Concern Present (08/19/2022)   [employer] of Occupational Health - Occupational Stress Questionnaire    Feeling of Stress : Not at all  Social Connections: Socially Isolated (08/19/2022)   Social Connection and Isolation Panel [NHANES]    Frequency of Communication with Friends and Family: More than three times a week    Frequency of Social Gatherings with Friends and Family: Once a week    Attends Religious Services: Never    Database administrator or Organizations: No    Attends Banker Meetings: Never    Marital Status: Divorced  Catering manager Violence: Not At Risk (08/19/2022)   Humiliation, Afraid,  Rape, and Kick questionnaire    Fear of Current or Ex-Partner: No    Emotionally Abused: No    Physically Abused: No    Sexually Abused: No    Physical Exam: Vital signs in last 24 hours: @BP  126/71   Pulse 60   Temp 98 F (36.7 C) (Skin)   Ht 5\' 3"  (1.6 m)   Wt 137 lb (62.1 kg)  LMP  (LMP Unknown)   SpO2 98%   BMI 24.27 kg/m  GEN: NAD EYE: Sclerae anicteric ENT: MMM CV: Non-tachycardic Pulm: CTA b/l GI: Soft, NT/ND NEURO:  Alert & Oriented x 3   Doristine Locks, DO [facility]   03/20/2023 1:51 PM

## 2023-03-20 NOTE — Op Note (Signed)
[facility] Patient Name: [name] [name] Procedure Date: 03/20/2023 1:56 PM MRN: [id number] Endoscopist: Doristine Locks , MD, [id number] Age: 72 Referring MD:  Date of Birth: [birth year] Gender: Female Account #: [id number] Procedure:                Colonoscopy Indications:              Screening for colorectal malignant neoplasm. Last                            colonoscopy was more than 10 years ago.                           Last colonoscopy was 07/2011 and normal/no polyps. Medicines:                Monitored Anesthesia Care Procedure:                Pre-Anesthesia Assessment:                           - Prior to the procedure, a History and Physical                            was performed, and patient medications and                            allergies were reviewed. The patient's tolerance of                            previous anesthesia was also reviewed. The risks                            and benefits of the procedure and the sedation                            options and risks were discussed with the patient.                            All questions were answered, and informed consent                            was obtained. Prior Anticoagulants: The patient has                            taken no anticoagulant or antiplatelet agents. ASA                            Grade Assessment: II - A patient with mild systemic                            disease. After reviewing the risks and benefits,                            the patient was deemed in satisfactory condition to  undergo the procedure.                           After obtaining informed consent, the colonoscope                            was passed under direct vision. Throughout the                            procedure, the patient's blood pressure, pulse, and                            oxygen saturations were monitored continuously. The                            Olympus CF-HQ190L  ([id number]) Colonoscope was                            introduced through the anus and advanced to the the                            cecum, identified by appendiceal orifice and                            ileocecal valve. The colonoscopy was performed                            without difficulty. The patient tolerated the                            procedure well. The quality of the bowel                            preparation was good. The ileocecal valve,                            appendiceal orifice, and rectum were photographed. Scope In: 2:02:13 PM Scope Out: 2:16:09 PM Scope Withdrawal Time: 0 hours 8 minutes 10 seconds  Total Procedure Duration: 0 hours 13 minutes 56 seconds  Findings:                 The perianal and digital rectal examinations were                            normal.                           A 3 mm polyp was found in the descending colon. The                            polyp was sessile. The polyp was removed with a                            cold snare. Resection and retrieval were complete.  Estimated blood loss was minimal.                           Multiple medium-mouthed and small-mouthed                            diverticula were found in the sigmoid colon.                           Non-bleeding internal hemorrhoids were found during                            retroflexion. The hemorrhoids were small. Complications:            No immediate complications. Estimated Blood Loss:     Estimated blood loss was minimal. Impression:               - One 3 mm polyp in the descending colon, removed                            with a cold snare. Resected and retrieved.                           - Diverticulosis in the sigmoid colon.                           - Non-bleeding internal hemorrhoids. Recommendation:           - Patient has a contact number available for                            emergencies. The signs and symptoms of potential                             delayed complications were discussed with the                            patient. Return to normal activities tomorrow.                            Written discharge instructions were provided to the                            patient.                           - Resume previous diet.                           - Continue present medications.                           - Await pathology results.                           - Repeat colonoscopy for surveillance based on  pathology results.                           - Return to GI clinic PRN. Doristine Locks, MD 03/20/2023 2:21:11 PM

## 2023-03-20 NOTE — Progress Notes (Signed)
Vss nad trans to pacu 

## 2023-03-20 NOTE — Progress Notes (Signed)
Called to room to assist during endoscopic procedure.  Patient ID and intended procedure confirmed with present staff. Received instructions for my participation in the procedure from the performing physician.  

## 2023-03-21 ENCOUNTER — Telehealth: Payer: Self-pay

## 2023-03-21 NOTE — Telephone Encounter (Signed)
  Follow up Call-     03/20/2023    1:22 PM  Call back number  Post procedure Call Back phone  # [phone]  Permission to leave phone message Yes     Patient questions:  Do you have a fever, pain , or abdominal swelling? No. Pain Score  0 *  Have you tolerated food without any problems? Yes.    Have you been able to return to your normal activities? Yes.    Do you have any questions about your discharge instructions: Diet   No. Medications  No. Follow up visit  No.  Do you have questions or concerns about your Care? No.  Actions: * If pain score is 4 or above: No action needed, pain <4.

## 2023-05-01 ENCOUNTER — Telehealth: Payer: Self-pay | Admitting: Gastroenterology

## 2023-05-01 NOTE — Telephone Encounter (Signed)
Inbound call from patient requesting a call back to discuss 7/1 colonoscopy results. Please advise, thank you.

## 2023-05-02 NOTE — Telephone Encounter (Signed)
7/1 pathology results:  The polyp removed during your recent colonoscopy was a Tubular Adenoma.  This type of polyp is considered benign, but potentially precancerous.  This polyp was removed entirely at the time of your colonoscopy.     Based on the results of this study, and per current nationally recognized surveillance guidelines, I recommend you have a repeat colonoscopy in 7 years to determine if you have developed any new polyps and screen for colorectal cancer. If you develop any new rectal bleeding, abdominal pain or significant bowel habit changes, please contact us before then at [phone] or via My Chart.   As always, please do not hesitate to contact my office with additional questions or concerns [phone]   - Dr. Barron Alvine

## 2023-05-02 NOTE — Telephone Encounter (Signed)
Called and spoke with patient regarding pathology results from 03/20/23. Pt is aware that I will mail her a copy of results as well since she does not use MyChart. Pt confirmed address on file. Pt verbalized understanding of results and had no concerns at the ned of the call.

## 2023-05-16 ENCOUNTER — Other Ambulatory Visit: Payer: Self-pay | Admitting: Obstetrics and Gynecology

## 2023-05-16 DIAGNOSIS — N6310 Unspecified lump in the right breast, unspecified quadrant: Secondary | ICD-10-CM | POA: Insufficient documentation

## 2023-05-16 DIAGNOSIS — N63 Unspecified lump in unspecified breast: Secondary | ICD-10-CM

## 2023-05-21 ENCOUNTER — Other Ambulatory Visit: Payer: Self-pay | Admitting: Physician Assistant

## 2023-05-23 ENCOUNTER — Encounter: Payer: Self-pay | Admitting: Physician Assistant

## 2023-06-12 DIAGNOSIS — H35413 Lattice degeneration of retina, bilateral: Secondary | ICD-10-CM | POA: Diagnosis not present

## 2023-06-12 DIAGNOSIS — H524 Presbyopia: Secondary | ICD-10-CM | POA: Diagnosis not present

## 2023-06-14 DIAGNOSIS — Z23 Encounter for immunization: Secondary | ICD-10-CM | POA: Diagnosis not present

## 2023-06-29 ENCOUNTER — Other Ambulatory Visit: Payer: Self-pay | Admitting: Obstetrics and Gynecology

## 2023-06-29 ENCOUNTER — Ambulatory Visit
Admission: RE | Admit: 2023-06-29 | Discharge: 2023-06-29 | Disposition: A | Discharge status: Home / Self Care | Payer: Medicare Other | Source: Ambulatory Visit | Attending: Obstetrics and Gynecology | Admitting: Obstetrics and Gynecology

## 2023-06-29 DIAGNOSIS — N63 Unspecified lump in unspecified breast: Secondary | ICD-10-CM

## 2023-06-29 DIAGNOSIS — N6310 Unspecified lump in the right breast, unspecified quadrant: Secondary | ICD-10-CM

## 2023-06-29 DIAGNOSIS — N6312 Unspecified lump in the right breast, upper inner quadrant: Secondary | ICD-10-CM | POA: Diagnosis not present

## 2023-07-03 ENCOUNTER — Ambulatory Visit
Admission: RE | Admit: 2023-07-03 | Discharge: 2023-07-03 | Disposition: A | Discharge status: Home / Self Care | Payer: Medicare Other | Source: Ambulatory Visit | Attending: Obstetrics and Gynecology | Admitting: Obstetrics and Gynecology

## 2023-07-03 DIAGNOSIS — C50411 Malignant neoplasm of upper-outer quadrant of right female breast: Secondary | ICD-10-CM | POA: Diagnosis not present

## 2023-07-03 DIAGNOSIS — N6312 Unspecified lump in the right breast, upper inner quadrant: Secondary | ICD-10-CM | POA: Diagnosis not present

## 2023-07-03 DIAGNOSIS — N6310 Unspecified lump in the right breast, unspecified quadrant: Secondary | ICD-10-CM

## 2023-07-03 DIAGNOSIS — Z170 Estrogen receptor positive status [ER+]: Secondary | ICD-10-CM | POA: Diagnosis not present

## 2023-07-03 HISTORY — PX: BREAST BIOPSY: SHX20

## 2023-07-05 LAB — SURGICAL PATHOLOGY

## 2023-07-06 ENCOUNTER — Telehealth: Payer: Self-pay | Admitting: Hematology and Oncology

## 2023-07-06 NOTE — Telephone Encounter (Signed)
LVM to return my call in reference to [facility] appointment on 10/23 at 815, will  go ahead and mail packet to patient, did leave my return call back number

## 2023-07-06 NOTE — Telephone Encounter (Signed)
Spoke to patient to confirm upcoming morning [facility] clinic appointment on 10/23, paperwork will be sent via e-mail.   Gave location and time, also informed patient that the surgeon's office would be calling as well to get information from them similar to the packet that they will be receiving so make sure to do both.  Reminded patient that all providers will be coming to the clinic to see them HERE and if they had any questions to not hesitate to reach back out to myself or their navigators.

## 2023-07-10 ENCOUNTER — Encounter: Payer: Self-pay | Admitting: *Deleted

## 2023-07-10 DIAGNOSIS — Z170 Estrogen receptor positive status [ER+]: Secondary | ICD-10-CM | POA: Insufficient documentation

## 2023-07-10 NOTE — Progress Notes (Signed)
Radiation Oncology         [phone] ________________________________  Name: [name] [name]        MRN: [id number]  Date of Service: 07/12/2023 DOB: [birth year]  CC:Allwardt, Crist Infante, PA-C  Griselda Miner, MD     REFERRING PHYSICIAN: Chevis Pretty III, MD   DIAGNOSIS: The encounter diagnosis was Malignant neoplasm of upper-inner quadrant of right breast in female, estrogen receptor positive (HCC).   HISTORY OF PRESENT ILLNESS: [name] [name] is a 72 y.o. female seen in the multidisciplinary breast clinic for a new diagnosis of right breast cancer. The patient was noted to have a palpable mass in the right breast.  She proceeded with diagnostic workup which confirmed palpable findings on physical exam and mammogram.  She has retropectoral implantsA.  By ultrasound the palpable area of concern was in the 1:00 breast corresponding to an oval mass measuring 7 mm with a few internal cystic spaces.  Adjacent there was a complex circumscribed mass measuring up to 1.6 cm with no vascularity confirming the finding by mammography, the axilla was negative for adenopathy. 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  There were mucinous features of the specimen.  In the adjacent 1:00 specimen 4 cm from the nipple similar morphology of grade 2 invasive mammary carcinoma with mucinous features was noted with low to intermediate grade DCIS.  The tumor was ER/PR positive HER2 negative with a Ki-67 of 10%, and she is seen to discuss treatment recommendations of her cancer.    PREVIOUS RADIATION THERAPY: No   PAST MEDICAL HISTORY:  Past Medical History:  Diagnosis Date   Anxiety    Colonic polyp    FMD (facioscapulohumeral muscular dystrophy) (HCC)    GERD (gastroesophageal reflux disease)    Headache(784.0)    Hypercholesterolemia    Hypertension    Idiopathic urticaria    Varicose veins of lower  extremities        PAST SURGICAL HISTORY: Past Surgical History:  Procedure Laterality Date   AUGMENTATION MAMMAPLASTY Bilateral    benign breast biopsy     bilateral breast implants     BREAST BIOPSY     unsure which breast and when   BREAST BIOPSY Right 07/03/2023   [place] RT BREAST BX W LOC DEV EA ADD LESION IMG BX SPEC US GUIDE 07/03/2023 [facility]   BREAST BIOPSY Right 07/03/2023   [place] RT BREAST BX W LOC DEV 1ST LESION IMG BX SPEC US GUIDE 07/03/2023 [facility]   cataract right eye w/ lens implant     COLONOSCOPY  03/20/2023   COLONOSCOPY  2012   Dr Jarold Motto, normal   excision of large anal polyp     PERIPHERAL VASCULAR CATHETERIZATION N/A 02/11/2016   Procedure: Renal Angiography;  Surgeon: Fransisco Hertz, MD;  Location: [facility] INVASIVE CV LAB;  Service: Cardiovascular;  Laterality: N/A;   POLYPECTOMY     VAGINAL HYSTERECTOMY     varicose vein       FAMILY HISTORY:  Family History  Problem Relation Age of Onset   Hypertension Mother    Hyperlipidemia Mother    Diabetes Mother    Breast cancer Mother    Pulmonary embolism Mother    Deep vein thrombosis Mother    Dementia Father    Sickle cell anemia Cousin    Colon cancer Neg Hx    Esophageal cancer Neg Hx    Stomach  cancer Neg Hx    Colon polyps Neg Hx    Rectal cancer Neg Hx      SOCIAL HISTORY:  reports that she has never smoked. She has never used smokeless tobacco. She reports that she does not drink alcohol and does not use drugs.  The patient is single and lives in [place].  She used to work for [employer], but is now retired.   ALLERGIES: Trazodone and nefazodone   MEDICATIONS:  Current Outpatient Medications  Medication Sig Dispense Refill   amLODipine (NORVASC) 5 MG tablet TAKE 1 TABLET BY MOUTH EVERY DAY 90 tablet 1   aspirin EC 81 MG tablet Take 81 mg by mouth daily.     BIOTIN PO Take by mouth.     cholecalciferol (VITAMIN D) 1000 units tablet Take 1,000 Units by mouth daily.      estradiol (ESTRACE) 0.5 MG tablet Take 0.5 mg by mouth daily. Daily for 21 days, then do not take for 7 days     nebivolol (BYSTOLIC) 10 MG tablet TAKE 1 TABLET BY MOUTH EVERY DAY 90 tablet 3   rosuvastatin (CRESTOR) 10 MG tablet TAKE 1 TABLET BY MOUTH EVERY DAY 90 tablet 3   vitamin E 400 UNIT capsule Take 400 Units by mouth daily.     Current Facility-Administered Medications  Medication Dose Route Frequency Provider Last Rate Last Admin   0.9 %  sodium chloride infusion  500 mL Intravenous Once Cirigliano, Vito V, DO         REVIEW OF SYSTEMS: On review of systems, the patient reports that she is doing well overall and vocalizes no breast specific complaints.      PHYSICAL EXAM:  Wt Readings from Last 3 Encounters:  03/20/23 137 lb (62.1 kg)  02/23/23 137 lb (62.1 kg)  01/30/23 137 lb 12.8 oz (62.5 kg)   Temp Readings from Last 3 Encounters:  03/20/23 98 F (36.7 C) (Skin)  01/30/23 97.8 F (36.6 C)  07/29/22 97.8 F (36.6 C) (Temporal)   BP Readings from Last 3 Encounters:  03/20/23 (!) 121/49  01/30/23 120/62  07/29/22 120/70   Pulse Readings from Last 3 Encounters:  03/20/23 70  01/30/23 (!) 57  07/29/22 (!) 56    In general this is a well appearing African American female in no acute distress. She's alert and oriented x4 and appropriate throughout the examination. Cardiopulmonary assessment is negative for acute distress and she exhibits normal effort. Bilateral breast exam is deferred.    ECOG = 0  0 - Asymptomatic (Fully active, able to carry on all predisease activities without restriction)  1 - Symptomatic but completely ambulatory (Restricted in physically strenuous activity but ambulatory and able to carry out work of a light or sedentary nature. For example, light housework, office work)  2 - Symptomatic, <50% in bed during the day (Ambulatory and capable of all self care but unable to carry out any work activities. Up and about more than 50% of waking  hours)  3 - Symptomatic, >50% in bed, but not bedbound (Capable of only limited self-care, confined to bed or chair 50% or more of waking hours)  4 - Bedbound (Completely disabled. Cannot carry on any self-care. Totally confined to bed or chair)  5 - Death   Santiago Glad MM, Creech RH, Tormey DC, et al. [phone]). "Toxicity and response criteria of the [facility]". Am. Evlyn Clines. Oncol. 5 (6): 649-55    LABORATORY DATA:  Lab Results  Component Value  Date   WBC 5.5 07/29/2022   HGB 12.5 07/29/2022   HCT 37.9 07/29/2022   MCV 89.4 07/29/2022   PLT 285.0 07/29/2022   Lab Results  Component Value Date   NA 138 07/29/2022   K 4.5 07/29/2022   CL 101 07/29/2022   CO2 31 07/29/2022   Lab Results  Component Value Date   ALT 18 07/29/2022   AST 27 07/29/2022   ALKPHOS 79 07/29/2022   BILITOT 0.4 07/29/2022      RADIOGRAPHY: [place] RT BREAST BX W LOC DEV 1ST LESION IMG BX SPEC US GUIDE  Addendum Date: 07/05/2023   ADDENDUM REPORT: 07/05/2023 12:46 ADDENDUM: Pathology revealed INVASIVE MAMMARY CARCINOMA WITH MUCINOUS FEATURES, 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 This was found to be concordant by Dr. Meda Klinefelter. 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 This was found to be concordant by Dr. Meda Klinefelter. Pathology results were discussed with the patient by telephone. The patient reported doing well after the biopsies with tenderness at the sites. Post biopsy instructions and care were reviewed and questions were answered. The patient was encouraged to call [facility] for any additional concerns. My direct phone number was provided. The patient was referred to [facility] at [facility] on July 12, 2023. Consideration for a bilateral breast MRI for further evaluation of extent of disease, breast density-C. Pathology results reported by Rene Kocher, RN on 07/05/2023. Electronically Signed   By: Meda Klinefelter M.D.   On: 07/05/2023 12:46   Result Date: 07/05/2023 CLINICAL DATA:  Palpable RIGHT breast masses EXAM: ULTRASOUND GUIDED RIGHT BREAST CORE NEEDLE BIOPSY x2 COMPARISON:  Previous exam(s). PROCEDURE: I met with the patient and we discussed the procedure of ultrasound-guided biopsy, including benefits and alternatives. We discussed the high likelihood of a successful procedure. We discussed the risks of the procedure, including infection, bleeding, tissue injury, clip migration, and inadequate sampling. Informed written consent was given. The usual time-out protocol was performed immediately prior to the procedure. Targeted ultrasound was performed of the RIGHT upper inner breast. For purposes of this exam, the larger 16 mm mass has been designated as 1 o'clock 4 cm from the nipple while the smaller adjacent 7 mm mass is designated as 1 o'clock 5 cm from the nipple. Site 1: RIGHT breast 1 o'clock 5 cm from the nipple. Lesion quadrant: Upper inner quadrant Using sterile technique and 1% lidocaine and 1% lidocaine with epinephrine as local anesthetic, under direct ultrasound visualization, a 14 gauge spring-loaded device was used to perform biopsy of a mass at 1 o'clock 5 cm from the nipple using a medial approach. At the conclusion of the procedure a RIBBON shaped tissue marker clip was deployed into the biopsy cavity. Follow up 2 view mammogram was performed and dictated separately. Site 2: RIGHT breast 1 o'clock 4 cm from the nipple. Lesion quadrant: Upper inner quadrant Using sterile technique and 1% lidocaine and 1% lidocaine with epinephrine as local anesthetic, under direct ultrasound  visualization, a 14 gauge spring-loaded device was used to perform biopsy of a mass at 1 o'clock 4 cm from the nipple using a medial approach. At the conclusion of the procedure a  COIL shaped tissue marker clip was deployed into the biopsy cavity. Follow up 2 view mammogram was performed and dictated separately. IMPRESSION: Ultrasound guided biopsy of 2 adjacent RIGHT breast masses. No apparent complications. Electronically Signed: By: Meda Klinefelter M.D. 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 This was found to be concordant by Dr. Meda Klinefelter. 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 This was found to be concordant by Dr. Meda Klinefelter. Pathology results were discussed with the patient by telephone. The patient reported doing well after the biopsies with tenderness at the sites. Post biopsy instructions and care were reviewed and questions were answered. The patient was encouraged to call [facility] for any additional concerns. My direct phone number was provided. The patient was referred to [facility] on July 12, 2023. Consideration for a bilateral breast MRI for further evaluation of extent of disease, breast density-C. Pathology results reported by Rene Kocher, RN on 07/05/2023. Electronically Signed   By: Meda Klinefelter M.D.   On: 07/05/2023 12:46   Result Date: 07/05/2023 CLINICAL  DATA:  Palpable RIGHT breast masses EXAM: ULTRASOUND GUIDED RIGHT BREAST CORE NEEDLE BIOPSY x2 COMPARISON:  Previous exam(s). PROCEDURE: I met with the patient and we discussed the procedure of ultrasound-guided biopsy, including benefits and alternatives. We discussed the high likelihood of a successful procedure. We discussed the risks of the procedure, including infection, bleeding, tissue injury, clip migration, and inadequate sampling. Informed written consent was given. The usual time-out protocol was performed immediately prior to the procedure. Targeted ultrasound was performed of the RIGHT upper inner breast. For purposes of this exam, the larger 16 mm mass has been designated as 1 o'clock 4 cm from the nipple while the smaller adjacent 7 mm mass is designated as 1 o'clock 5 cm from the nipple. Site 1: RIGHT breast 1 o'clock 5 cm from the nipple. Lesion quadrant: Upper inner quadrant Using sterile technique and 1% lidocaine and 1% lidocaine with epinephrine as local anesthetic, under direct ultrasound visualization, a 14 gauge spring-loaded device was used to perform biopsy of a mass at 1 o'clock 5 cm from the nipple using a medial approach. At the conclusion of the procedure a RIBBON shaped tissue marker clip was deployed into the biopsy cavity. Follow up 2 view mammogram was performed and dictated separately. Site 2: RIGHT breast 1 o'clock 4 cm from the nipple. Lesion quadrant: Upper inner quadrant Using sterile technique and 1% lidocaine and 1% lidocaine with epinephrine as local anesthetic, under direct ultrasound visualization, a 14 gauge spring-loaded device was used to perform biopsy of a mass at 1 o'clock 4 cm from the nipple using a medial approach. At the conclusion of the procedure a COIL shaped tissue marker clip was deployed into the biopsy cavity. Follow up 2 view mammogram was performed and dictated separately. IMPRESSION: Ultrasound guided biopsy of 2 adjacent RIGHT breast masses. No  apparent complications. Electronically Signed: By: Fermin Schwab.D.  On: 07/03/2023 15:17   MM CLIP PLACEMENT RIGHT  Result Date: 07/03/2023 CLINICAL DATA:  Status post 2 site RIGHT breast ultrasound-guided biopsy EXAM: 3D DIAGNOSTIC RIGHT MAMMOGRAM POST ULTRASOUND BIOPSY COMPARISON:  Previous exam(s). FINDINGS: 3D Mammographic images were obtained following ultrasound guided biopsy of a smaller mass at 1 o'clock 5 cm from the nipple. The RIBBON biopsy marking clip is in expected position at the site of biopsy. 3D Mammographic images were obtained following ultrasound guided biopsy of mass at 1 o'clock 4 cm from the nipple. The COIL biopsy marking clip is in expected position at the site of biopsy. IMPRESSION: 1. Appropriate positioning of the RIBBON shaped biopsy marking clip at the site of biopsy in the upper inner breast. 2. Appropriate positioning of the COIL shaped biopsy marking clip at the site of biopsy in the upper inner breast. Final Assessment: Post Procedure Mammograms for Marker Placement BI-RADS CATEGORY  5M: Post-Procedure Mammogram for Marker Placement Electronically Signed   By: Meda Klinefelter M.D.   On: 07/03/2023 15:18   MM 3D DIAGNOSTIC MAMMOGRAM UNILATERAL RIGHT BREAST W/IMPLANT  Result Date: 06/29/2023 CLINICAL DATA:  72 year old female presenting for evaluation of a new lump in the right breast. Patient reports she first noticed the right breast lump approximately 1 month ago. EXAM: DIGITAL DIAGNOSTIC UNILATERAL RIGHT MAMMOGRAM WITH IMPLANTS, TOMOSYNTHESIS AND CAD; ULTRASOUND RIGHT BREAST LIMITED TECHNIQUE: Right digital diagnostic mammography and breast tomosynthesis was performed. Standard and/or implant displaced views were performed. The images were evaluated with computer-aided detection. ; Targeted ultrasound examination of the right breast was performed COMPARISON:  Previous exam(s). ACR Breast Density Category c: The breasts are heterogeneously dense, which may  obscure small masses. FINDINGS: Mammogram: The patient has retropectoral implants. A skin BB marks the palpable site of concern reported by the patient in the upper inner right breast. A spot tangential view of this area was performed in addition to standard views. At the palpable site there is likely an obscured mass measuring 1.5 cm. No additional suspicious findings elsewhere in the right breast. On physical exam at the site of concern reported by the patient in the upper inner right breast I feel a discrete mass. Ultrasound: At the palpable site of concern in the right breast at 1 o'clock 4 cm from the nipple there are adjacent masses. The first is oval circumscribed hypoechoic measuring 0.7 x 0.5 x 0.6 cm. There are a few internal cystic spaces. Adjacent to this there is a complex circumscribed mass measuring 1.6 x 1.1 x 1.2 cm. No internal vascularity. This corresponds to the mammographic finding. Targeted ultrasound of the right axilla demonstrates normal lymph nodes. IMPRESSION: Indeterminate adjacent masses at the palpable site of concern in the right breast at 1 o'clock, measuring 0.7 cm and 1.6 cm. RECOMMENDATION: Ultrasound-guided core needle biopsy x2 of the right breast. I have discussed the findings and recommendations with the patient. If applicable, a reminder letter will be sent to the patient regarding the next appointment. BI-RADS CATEGORY  4: Suspicious. Electronically Signed   By: Emmaline Kluver M.D.   On: 06/29/2023 15:37   [place] LIMITED ULTRASOUND INCLUDING AXILLA RIGHT BREAST  Result Date: 06/29/2023 CLINICAL DATA:  72 year old female presenting for evaluation of a new lump in the right breast. Patient reports she first noticed the right breast lump approximately 1 month ago. EXAM: DIGITAL DIAGNOSTIC UNILATERAL RIGHT MAMMOGRAM WITH IMPLANTS, TOMOSYNTHESIS AND CAD; ULTRASOUND RIGHT BREAST LIMITED TECHNIQUE: Right digital diagnostic mammography and breast tomosynthesis was performed.  Standard and/or implant  displaced views were performed. The images were evaluated with computer-aided detection. ; Targeted ultrasound examination of the right breast was performed COMPARISON:  Previous exam(s). ACR Breast Density Category c: The breasts are heterogeneously dense, which may obscure small masses. FINDINGS: Mammogram: The patient has retropectoral implants. A skin BB marks the palpable site of concern reported by the patient in the upper inner right breast. A spot tangential view of this area was performed in addition to standard views. At the palpable site there is likely an obscured mass measuring 1.5 cm. No additional suspicious findings elsewhere in the right breast. On physical exam at the site of concern reported by the patient in the upper inner right breast I feel a discrete mass. Ultrasound: At the palpable site of concern in the right breast at 1 o'clock 4 cm from the nipple there are adjacent masses. The first is oval circumscribed hypoechoic measuring 0.7 x 0.5 x 0.6 cm. There are a few internal cystic spaces. Adjacent to this there is a complex circumscribed mass measuring 1.6 x 1.1 x 1.2 cm. No internal vascularity. This corresponds to the mammographic finding. Targeted ultrasound of the right axilla demonstrates normal lymph nodes. IMPRESSION: Indeterminate adjacent masses at the palpable site of concern in the right breast at 1 o'clock, measuring 0.7 cm and 1.6 cm. RECOMMENDATION: Ultrasound-guided core needle biopsy x2 of the right breast. I have discussed the findings and recommendations with the patient. If applicable, a reminder letter will be sent to the patient regarding the next appointment. BI-RADS CATEGORY  4: Suspicious. Electronically Signed   By: Emmaline Kluver M.D.   On: 06/29/2023 15:37       IMPRESSION/PLAN: 1. 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 Dr. Mitzi Hansen discusses the pathology findings and reviews the  nature of right breast disease. The consensus from the breast conference includes breast conservation with lumpectomy. Depending on the size of the final tumor measurements rendered by pathology, the tumor may be tested for Oncotype Dx score to determine a role for systemic therapy. Provided that chemotherapy is not indicated, the patient's course would then be followed by external radiotherapy to the breast  to reduce risks of local recurrence followed by antiestrogen therapy. We discussed the risks, benefits, short, and long term effects of radiotherapy, as well as the curative intent, and the patient is interested in proceeding. Dr. Mitzi Hansen discusses the delivery and logistics of radiotherapy and anticipates a course of 6 1/2 weeks of radiotherapy given her in situ implants. We will see her back a few weeks after surgery to discuss the simulation process and anticipate we starting radiotherapy about 4-6 weeks after surgery.  2. Possible genetic predisposition to malignancy. The patient is a candidate for genetic testing given her personal and family history. She will meet with our geneticist today in clinic.   In a visit lasting 60 minutes, greater than 50% of the time was spent face to face reviewing her case, as well as in preparation of, discussing, and coordinating the patient's care.  The above documentation reflects my direct findings during this shared patient visit. Please see the separate note by Dr. Mitzi Hansen on this date for the remainder of the patient's plan of care.    Joyice Faster, PA-C     **Disclaimer: This note was dictated with voice recognition software. Similar sounding words can inadvertently be transcribed and this note may contain transcription errors which may not have been corrected upon publication of note.**

## 2023-07-12 ENCOUNTER — Ambulatory Visit: Payer: Self-pay | Admitting: General Surgery

## 2023-07-12 ENCOUNTER — Inpatient Hospital Stay: Payer: Medicare Other | Admitting: Genetic Counselor

## 2023-07-12 ENCOUNTER — Encounter: Payer: Self-pay | Admitting: Genetic Counselor

## 2023-07-12 ENCOUNTER — Encounter: Payer: Self-pay | Admitting: Hematology and Oncology

## 2023-07-12 ENCOUNTER — Ambulatory Visit
Admission: RE | Admit: 2023-07-12 | Discharge: 2023-07-12 | Disposition: A | Discharge status: Home / Self Care | Payer: Medicare Other | Source: Ambulatory Visit | Attending: Radiation Oncology | Admitting: Radiation Oncology

## 2023-07-12 ENCOUNTER — Other Ambulatory Visit: Payer: Self-pay

## 2023-07-12 ENCOUNTER — Ambulatory Visit: Payer: Medicare Other | Admitting: Physical Therapy

## 2023-07-12 ENCOUNTER — Inpatient Hospital Stay: Payer: Medicare Other | Attending: Hematology and Oncology

## 2023-07-12 ENCOUNTER — Inpatient Hospital Stay: Payer: Medicare Other | Admitting: Hematology and Oncology

## 2023-07-12 VITALS — BP 131/53 | HR 67 | Temp 97.8°F | Resp 16 | Wt 141.0 lb

## 2023-07-12 DIAGNOSIS — C50811 Malignant neoplasm of overlapping sites of right female breast: Secondary | ICD-10-CM

## 2023-07-12 DIAGNOSIS — Z170 Estrogen receptor positive status [ER+]: Secondary | ICD-10-CM

## 2023-07-12 DIAGNOSIS — Z803 Family history of malignant neoplasm of breast: Secondary | ICD-10-CM | POA: Diagnosis not present

## 2023-07-12 DIAGNOSIS — C50211 Malignant neoplasm of upper-inner quadrant of right female breast: Secondary | ICD-10-CM | POA: Diagnosis not present

## 2023-07-12 DIAGNOSIS — Z853 Personal history of malignant neoplasm of breast: Secondary | ICD-10-CM | POA: Diagnosis not present

## 2023-07-12 DIAGNOSIS — Z800 Family history of malignant neoplasm of digestive organs: Secondary | ICD-10-CM | POA: Diagnosis not present

## 2023-07-12 LAB — CMP (CANCER CENTER ONLY)
ALT: 23 U/L (ref 0–44)
AST: 30 U/L (ref 15–41)
Albumin: 4.1 g/dL (ref 3.5–5.0)
Alkaline Phosphatase: 86 U/L (ref 38–126)
Anion gap: 7 (ref 5–15)
BUN: 16 mg/dL (ref 8–23)
CO2: 31 mmol/L (ref 22–32)
Calcium: 9.7 mg/dL (ref 8.9–10.3)
Chloride: 103 mmol/L (ref 98–111)
Creatinine: 1.25 mg/dL — ABNORMAL HIGH (ref 0.44–1.00)
GFR, Estimated: 46 mL/min — ABNORMAL LOW (ref >60)
Glucose, Bld: 109 mg/dL — ABNORMAL HIGH (ref 70–99)
Potassium: 3 mmol/L — ABNORMAL LOW (ref 3.5–5.1)
Sodium: 141 mmol/L (ref 135–145)
Total Bilirubin: 0.3 mg/dL (ref 0.3–1.2)
Total Protein: 7.4 g/dL (ref 6.5–8.1)

## 2023-07-12 LAB — CBC WITH DIFFERENTIAL (CANCER CENTER ONLY)
Abs Immature Granulocytes: 0.01 10*3/uL (ref 0.00–0.07)
Basophils Absolute: 0 10*3/uL (ref 0.0–0.1)
Basophils Relative: 0 %
Eosinophils Absolute: 0.1 10*3/uL (ref 0.0–0.5)
Eosinophils Relative: 1 %
HCT: 36.4 % (ref 36.0–46.0)
Hemoglobin: 12.3 g/dL (ref 12.0–15.0)
Immature Granulocytes: 0 %
Lymphocytes Relative: 28 %
Lymphs Abs: 1.5 10*3/uL (ref 0.7–4.0)
MCH: 29.9 pg (ref 26.0–34.0)
MCHC: 33.8 g/dL (ref 30.0–36.0)
MCV: 88.3 fL (ref 80.0–100.0)
Monocytes Absolute: 0.5 10*3/uL (ref 0.1–1.0)
Monocytes Relative: 10 %
Neutro Abs: 3.1 10*3/uL (ref 1.7–7.7)
Neutrophils Relative %: 61 %
Platelet Count: 272 10*3/uL (ref 150–400)
RBC: 4.12 MIL/uL (ref 3.87–5.11)
RDW: 13.1 % (ref 11.5–15.5)
WBC Count: 5.2 10*3/uL (ref 4.0–10.5)
nRBC: 0 % (ref 0.0–0.2)

## 2023-07-12 LAB — GENETIC SCREENING ORDER

## 2023-07-12 MED ORDER — VENLAFAXINE HCL ER 37.5 MG PO CP24: 37.5000 mg | ORAL_CAPSULE | Freq: Every day | ORAL | 1 refills | Status: DC

## 2023-07-12 MED ORDER — KETOROLAC TROMETHAMINE 15 MG/ML IJ SOLN: 15.0000 mg | INTRAMUSCULAR | Status: AC

## 2023-07-12 NOTE — Assessment & Plan Note (Signed)
This is a very pleasant 72 year old postmenopausal female patient with newly diagnosed right breast upper inner quadrant self palpated adjacent breast masses, pathology consistent with invasive mammary carcinoma with mucinous features, ER/PR positive HER2 nonamplified referred to breast [facility] for additional recommendations.  Given strong ER/PR positivity and node-negative T, we have discussed about upfront surgery followed by Oncotype DX testing.  We have discussed the following details about Oncotype DX.  We have discussed about Oncotype Dx score which is a well validated prognostic scoring system which can predict outcome with endocrine therapy alone and whether chemotherapy reduces recurrence.  Typically in patients with ER positive cancers that are node negative if the RS score is high typically greater than or equal to 26, chemotherapy is recommended.  In women with intermediate recurrence score younger than 50, there can still be some role for chemotherapy in addition to endocrine therapy especially if the recurrence score is between 21-25. If chemotherapy is needed, this will precede radiation and then after radiation she will continue on antiestrogen therapy.  We have also discussed about antiestrogen therapy today.  Given her prolonged use of hormone replacement therapy, we have discussed that she may tolerate tamoxifen better. We have discussed options for antiestrogen therapy today  With regards to Tamoxifen, we discussed that this is a SERM, selective estrogen receptor modulator. We discussed mechanism of action of Tamoxifen, adverse effects on Tamoxifen including but not limited to post menopausal symptoms, increased risk of DVT/PE, increased risk of endometrial cancer, questionable cataracts with long term use and increased risk of cardiovascular events in the study which was not statistically significant. A benefit from Tamoxifen would be improvement in bone density. With regards to aromatase  inhibitors, we discussed mechanism of action, adverse effects including but not limited to post menopausal symptoms, arthralgias, myalgias, increased risk of cardiovascular events and bone loss.   She was instructed to stop the hormone replacement therapy.  I have advised to start taking venlafaxine 37.5 mg extended release daily to curb vasomotor symptoms.  She is agreeable to this plan.  She will return to clinic after surgery to review Oncotype DX results and to discuss any additional recommendations

## 2023-07-12 NOTE — Progress Notes (Signed)
[facility] CONSULT NOTE  Patient Care Team: Allwardt, Crist Infante, PA-C as PCP - General (Physician Assistant) Carrington Clamp, MD as Consulting Physician (Obstetrics and Gynecology) Griselda Miner, MD as Consulting Physician (General Surgery) Rachel Moulds, MD as Consulting Physician (Hematology and Oncology) Dorothy Puffer, MD as Consulting Physician (Radiation Oncology) Pershing Proud, RN as Registered Nurse Donnelly Angelica, RN as Registered Nurse  CHIEF COMPLAINTS/PURPOSE OF CONSULTATION:  Newly diagnosed breast cancer  HISTORY OF PRESENTING ILLNESS:  [name] [name] 72 y.o. female is here because of recent diagnosis of right breast cancer  I reviewed her records extensively and collaborated the history with the patient.  SUMMARY OF ONCOLOGIC HISTORY: Oncology History  Malignant neoplasm of overlapping sites of right breast in female, estrogen receptor positive (HCC)  06/29/2023 Mammogram   Mammogram October 2024 showed right breast mass at 1:00 at the palpable site of concern 4 cm from the nipple measuring 7 x 5 x 6 mm.  Adjacent to this is a complex circumscribed mass measuring 1.6 x 1.1 x 1.2 cm.  Ultrasound of the right axilla demonstrated normal lymph nodes.   07/03/2023 Pathology Results   Right breast needle core biopsy showed invasive mammary carcinoma with mucinous features, DCIS with cribriform pattern and intermediate grade, overall grade of the invasive, (2, prognostics showed ER 9089% strong staining, PR 99% positive strong staining, HER2 0 and Ki-67 of 10%   07/10/2023 Initial Diagnosis   Malignant neoplasm of upper-inner quadrant of right breast in female, estrogen receptor positive (HCC)   07/10/2023 Cancer Staging   Staging form: Breast, AJCC 8th Edition - Clinical: Stage IA (cT1c, cN0, cM0, G2, ER+, PR+, HER2-) - Signed by Ronny Bacon, PA-C on 07/10/2023 Method of lymph node assessment: Clinical Histologic grading system: 3 grade  system    Patient arrived to the appointment today by herself.  At baseline she has been using estrogen supplementation for over 20 years for vasomotor symptoms.  She had hysterectomy in her 48s for fibroids.  Other than that she also uses amlodipine, nebivolol, rosuvastatin, aspirin and some other supplements. She felt this lump approximately a month ago.  She had a normal mammogram during the summer.  Rest of the pertinent 10 point ROS reviewed and negative  MEDICAL HISTORY:  Past Medical History:  Diagnosis Date   Anxiety    Breast cancer (HCC)    Colonic polyp    FMD (facioscapulohumeral muscular dystrophy) (HCC)    GERD (gastroesophageal reflux disease)    Headache(784.0)    Hypercholesterolemia    Hypertension    Idiopathic urticaria    Varicose veins of lower extremities     SURGICAL HISTORY: Past Surgical History:  Procedure Laterality Date   AUGMENTATION MAMMAPLASTY Bilateral    benign breast biopsy     bilateral breast implants     BREAST BIOPSY     unsure which breast and when   BREAST BIOPSY Right 07/03/2023   [place] RT BREAST BX W LOC DEV EA ADD LESION IMG BX SPEC US GUIDE 07/03/2023 [facility]   BREAST BIOPSY Right 07/03/2023   [place] RT BREAST BX W LOC DEV 1ST LESION IMG BX SPEC US GUIDE 07/03/2023 [facility]   cataract right eye w/ lens implant     COLONOSCOPY  03/20/2023   COLONOSCOPY  2012   Dr Jarold Motto, normal   excision of large anal polyp     PERIPHERAL VASCULAR CATHETERIZATION N/A 02/11/2016   Procedure: Renal Angiography;  Surgeon: Ottie Glazier  Imogene Burn, MD;  Location: [facility] INVASIVE CV LAB;  Service: Cardiovascular;  Laterality: N/A;   POLYPECTOMY     VAGINAL HYSTERECTOMY     varicose vein      SOCIAL HISTORY: Social History   Socioeconomic History   Marital status: Single    Spouse name: Not on file   Number of children: 0   Years of education: 12   Highest education level: Not on file  Occupational History    Comment: [employer]  Tobacco Use    Smoking status: Never   Smokeless tobacco: Never  Vaping Use   Vaping status: Never Used  Substance and Sexual Activity   Alcohol use: No    Alcohol/week: 0.0 standard drinks of alcohol   Drug use: No   Sexual activity: Not on file  Other Topics Concern   Not on file  Social History Narrative   Lives alone   No caffeine use   Social Determinants of Health   Financial Resource Strain: Low Risk  (08/19/2022)   Overall Financial Resource Strain (CARDIA)    Difficulty of Paying Living Expenses: Not hard at all  Food Insecurity: No Food Insecurity (08/19/2022)   Hunger Vital Sign    Worried About Running Out of Food in the Last Year: Never true    Ran Out of Food in the Last Year: Never true  Transportation Needs: No Transportation Needs (08/19/2022)   PRAPARE - Administrator, Civil Service (Medical): No    Lack of Transportation (Non-Medical): No  Physical Activity: Inactive (08/19/2022)   Exercise Vital Sign    Days of Exercise per Week: 0 days    Minutes of Exercise per Session: 0 min  Stress: No Stress Concern Present (08/19/2022)   [employer] of Occupational Health - Occupational Stress Questionnaire    Feeling of Stress : Not at all  Social Connections: Socially Isolated (08/19/2022)   Social Connection and Isolation Panel [NHANES]    Frequency of Communication with Friends and Family: More than three times a week    Frequency of Social Gatherings with Friends and Family: Once a week    Attends Religious Services: Never    Database administrator or Organizations: No    Attends Banker Meetings: Never    Marital Status: Divorced  Catering manager Violence: Not At Risk (08/19/2022)   Humiliation, Afraid, Rape, and Kick questionnaire    Fear of Current or Ex-Partner: No    Emotionally Abused: No    Physically Abused: No    Sexually Abused: No    FAMILY HISTORY: Family History  Problem Relation Age of Onset   Hypertension Mother     Hyperlipidemia Mother    Diabetes Mother    Breast cancer Mother    Pulmonary embolism Mother    Deep vein thrombosis Mother    Dementia Father    Sickle cell anemia Cousin    Colon cancer Neg Hx    Esophageal cancer Neg Hx    Stomach cancer Neg Hx    Colon polyps Neg Hx    Rectal cancer Neg Hx     ALLERGIES:  is allergic to trazodone and nefazodone.  MEDICATIONS:  Current Outpatient Medications  Medication Sig Dispense Refill   amLODipine (NORVASC) 5 MG tablet TAKE 1 TABLET BY MOUTH EVERY DAY 90 tablet 1   aspirin EC 81 MG tablet Take 81 mg by mouth daily.     BIOTIN PO Take by mouth.     cholecalciferol (VITAMIN  D) 1000 units tablet Take 1,000 Units by mouth daily.     estradiol (ESTRACE) 0.5 MG tablet Take 0.5 mg by mouth daily. Daily for 21 days, then do not take for 7 days     nebivolol (BYSTOLIC) 10 MG tablet TAKE 1 TABLET BY MOUTH EVERY DAY 90 tablet 3   rosuvastatin (CRESTOR) 10 MG tablet TAKE 1 TABLET BY MOUTH EVERY DAY 90 tablet 3   vitamin E 400 UNIT capsule Take 400 Units by mouth daily.     No current facility-administered medications for this visit.    REVIEW OF SYSTEMS:   Constitutional: Denies fevers, chills or abnormal night sweats Eyes: Denies blurriness of vision, double vision or watery eyes Ears, nose, mouth, throat, and face: Denies mucositis or sore throat Respiratory: Denies cough, dyspnea or wheezes Cardiovascular: Denies palpitation, chest discomfort or lower extremity swelling Gastrointestinal:  Denies nausea, heartburn or change in bowel habits Skin: Denies abnormal skin rashes Lymphatics: Denies new lymphadenopathy or easy bruising Neurological:Denies numbness, tingling or new weaknesses Behavioral/Psych: Mood is stable, no new changes  Breast: Denies any palpable lumps or discharge All other systems were reviewed with the patient and are negative.  PHYSICAL EXAMINATION: ECOG PERFORMANCE STATUS: 0 - Asymptomatic  Vitals:   07/12/23 0848   BP: (!) 131/53  Pulse: 67  Resp: 16  Temp: 97.8 F (36.6 C)  SpO2: 98%   Filed Weights   07/12/23 0848  Weight: 141 lb (64 kg)    GENERAL:alert, no distress and comfortable BREAST: In the right breast in the upper outer quadrant, there are 2 areas of abnormality adjacent to each other.  The larger palpable mass measured approximately 2 cm, smaller palpable mass measured approximately a centimeter.  She has bilateral retropectoral implants.  No regional adenopathy noted.  Left breast normal to inspection and palpation.  LABORATORY DATA:  I have reviewed the data as listed Lab Results  Component Value Date   WBC 5.2 07/12/2023   HGB 12.3 07/12/2023   HCT 36.4 07/12/2023   MCV 88.3 07/12/2023   PLT 272 07/12/2023   Lab Results  Component Value Date   NA 141 07/12/2023   K 3.0 (L) 07/12/2023   CL 103 07/12/2023   CO2 31 07/12/2023    RADIOGRAPHIC STUDIES: I have personally reviewed the radiological reports and agreed with the findings in the report.  ASSESSMENT AND PLAN:  Malignant neoplasm of overlapping sites of right breast in female, estrogen receptor positive (HCC) This is a very pleasant 72 year old postmenopausal female patient with newly diagnosed right breast upper inner quadrant self palpated adjacent breast masses, pathology consistent with invasive mammary carcinoma with mucinous features, ER/PR positive HER2 nonamplified referred to breast MDC for additional recommendations.  Given strong ER/PR positivity and node-negative T, we have discussed about upfront surgery followed by Oncotype DX testing.  We have discussed the following details about Oncotype DX.  We have discussed about Oncotype Dx score which is a well validated prognostic scoring system which can predict outcome with endocrine therapy alone and whether chemotherapy reduces recurrence.  Typically in patients with ER positive cancers that are node negative if the RS score is high typically greater than or  equal to 26, chemotherapy is recommended.  In women with intermediate recurrence score younger than 50, there can still be some role for chemotherapy in addition to endocrine therapy especially if the recurrence score is between 21-25. If chemotherapy is needed, this will precede radiation and then after radiation she will continue on  antiestrogen therapy.  We have also discussed about antiestrogen therapy today.  Given her prolonged use of hormone replacement therapy, we have discussed that she may tolerate tamoxifen better. We have discussed options for antiestrogen therapy today  With regards to Tamoxifen, we discussed that this is a SERM, selective estrogen receptor modulator. We discussed mechanism of action of Tamoxifen, adverse effects on Tamoxifen including but not limited to post menopausal symptoms, increased risk of DVT/PE, increased risk of endometrial cancer, questionable cataracts with long term use and increased risk of cardiovascular events in the study which was not statistically significant. A benefit from Tamoxifen would be improvement in bone density. With regards to aromatase inhibitors, we discussed mechanism of action, adverse effects including but not limited to post menopausal symptoms, arthralgias, myalgias, increased risk of cardiovascular events and bone loss.   She was instructed to stop the hormone replacement therapy.  I have advised to start taking venlafaxine 37.5 mg extended release daily to curb vasomotor symptoms.  She is agreeable to this plan.  She will return to clinic after surgery to review Oncotype DX results and to discuss any additional recommendations    All questions were answered. The patient knows to call the clinic with any problems, questions or concerns.    Rachel Moulds, MD 07/12/23

## 2023-07-12 NOTE — Progress Notes (Signed)
REFERRING PROVIDER: Rachel Moulds, MD  PRIMARY PROVIDER:  Allwardt, Crist Infante, PA-C  PRIMARY REASON FOR VISIT:  1. Malignant neoplasm of overlapping sites of right breast in female, estrogen receptor positive (HCC)    HISTORY OF PRESENT ILLNESS:   [name] [name], a 72 y.o. female, was seen for a [place] cancer genetics consultation at the request of Dr. Al Pimple due to a personal and family history of cancer.  [name] [name] presents to clinic today to discuss the possibility of a hereditary predisposition to cancer, to discuss genetic testing, and to further clarify her future cancer risks, as well as potential cancer risks for family members.   In October 2024, at the age of 63, [name] [name] was diagnosed with invasive mammary carcinoma of the right breast (ER/PR positive, HER2 negative).  CANCER HISTORY:  Oncology History  Malignant neoplasm of overlapping sites of right breast in female, estrogen receptor positive (HCC)  06/29/2023 Mammogram   Mammogram October 2024 showed right breast mass at 1:00 at the palpable site of concern 4 cm from the nipple measuring 7 x 5 x 6 mm.  Adjacent to this is a complex circumscribed mass measuring 1.6 x 1.1 x 1.2 cm.  Ultrasound of the right axilla demonstrated normal lymph nodes.   07/03/2023 Pathology Results   Right breast needle core biopsy showed invasive mammary carcinoma with mucinous features, DCIS with cribriform pattern and intermediate grade, overall grade of the invasive, (2, prognostics showed ER 9089% strong staining, PR 99% positive strong staining, HER2 0 and Ki-67 of 10%   07/10/2023 Initial Diagnosis   Malignant neoplasm of upper-inner quadrant of right breast in female, estrogen receptor positive (HCC)   07/10/2023 Cancer Staging   Staging form: Breast, AJCC 8th Edition - Clinical: Stage IA (cT1c, cN0, cM0, G2, ER+, PR+, HER2-) - Signed by Ronny Bacon, PA-C on 07/10/2023 Method of lymph node assessment: Clinical Histologic  grading system: 3 grade system     Past Medical History:  Diagnosis Date   Anxiety    Breast cancer (HCC)    Colonic polyp    FMD (facioscapulohumeral muscular dystrophy) (HCC)    GERD (gastroesophageal reflux disease)    Headache(784.0)    Hypercholesterolemia    Hypertension    Idiopathic urticaria    Varicose veins of lower extremities     Past Surgical History:  Procedure Laterality Date   AUGMENTATION MAMMAPLASTY Bilateral    benign breast biopsy     bilateral breast implants     BREAST BIOPSY     unsure which breast and when   BREAST BIOPSY Right 07/03/2023   [place] RT BREAST BX W LOC DEV EA ADD LESION IMG BX SPEC US GUIDE 07/03/2023 [facility]   BREAST BIOPSY Right 07/03/2023   [place] RT BREAST BX W LOC DEV 1ST LESION IMG BX SPEC US GUIDE 07/03/2023 [facility]   cataract right eye w/ lens implant     COLONOSCOPY  03/20/2023   COLONOSCOPY  2012   Dr Jarold Motto, normal   excision of large anal polyp     PERIPHERAL VASCULAR CATHETERIZATION N/A 02/11/2016   Procedure: Renal Angiography;  Surgeon: Fransisco Hertz, MD;  Location: [facility] INVASIVE CV LAB;  Service: Cardiovascular;  Laterality: N/A;   POLYPECTOMY     VAGINAL HYSTERECTOMY     varicose vein      Social History   Socioeconomic History   Marital status: Single    Spouse name: Not on file   Number of children: 0  Years of education: 54   Highest education level: Not on file  Occupational History    Comment: [employer]  Tobacco Use   Smoking status: Never   Smokeless tobacco: Never  Vaping Use   Vaping status: Never Used  Substance and Sexual Activity   Alcohol use: No    Alcohol/week: 0.0 standard drinks of alcohol   Drug use: No   Sexual activity: Not on file  Other Topics Concern   Not on file  Social History Narrative   Lives alone   No caffeine use   Social Determinants of Health   Financial Resource Strain: Low Risk  (08/19/2022)   Overall Financial Resource Strain (CARDIA)    Difficulty  of Paying Living Expenses: Not hard at all  Food Insecurity: No Food Insecurity (08/19/2022)   Hunger Vital Sign    Worried About Running Out of Food in the Last Year: Never true    Ran Out of Food in the Last Year: Never true  Transportation Needs: No Transportation Needs (08/19/2022)   PRAPARE - Administrator, Civil Service (Medical): No    Lack of Transportation (Non-Medical): No  Physical Activity: Inactive (08/19/2022)   Exercise Vital Sign    Days of Exercise per Week: 0 days    Minutes of Exercise per Session: 0 min  Stress: No Stress Concern Present (08/19/2022)   [employer] of Occupational Health - Occupational Stress Questionnaire    Feeling of Stress : Not at all  Social Connections: Socially Isolated (08/19/2022)   Social Connection and Isolation Panel [NHANES]    Frequency of Communication with Friends and Family: More than three times a week    Frequency of Social Gatherings with Friends and Family: Once a week    Attends Religious Services: Never    Database administrator or Organizations: No    Attends Engineer, structural: Never    Marital Status: Divorced     FAMILY HISTORY:  We obtained a detailed, 4-generation family history.  Significant diagnoses are listed below: Family History  Problem Relation Age of Onset   Hypertension Mother    Hyperlipidemia Mother    Diabetes Mother    Breast cancer Mother 26   Pulmonary embolism Mother    Deep vein thrombosis Mother    Pancreatic cancer Mother 110   Dementia Father    Leukemia Maternal Aunt    Sickle cell anemia Cousin    Colon cancer Neg Hx    Esophageal cancer Neg Hx    Stomach cancer Neg Hx    Colon polyps Neg Hx    Rectal cancer Neg Hx      [name]'s mother was diagnosed with breast cancer at age 50 and pancreatic cancer at age 34, she died at age 57. Her maternal aunt was diagnosed with leukemia at an unknown age, she is deceased. [name] reports her sister had genetic  testing but she does not know the results. There is no reported Ashkenazi Jewish ancestry.   GENETIC COUNSELING ASSESSMENT: [name] is a 72 y.o. female with a personal and family history of cancer which is somewhat suggestive of a hereditary predisposition to cancer. We, therefore, discussed and recommended the following at today's visit.   DISCUSSION: We discussed that 5 - 10% of cancer is hereditary, with most cases of breast cancer associated with BRCA1/2.  There are other genes that can be associated with hereditary breast cancer syndromes.  We discussed that testing is beneficial for  several reasons including knowing how to follow individuals after completing their treatment, identifying whether potential treatment options would be beneficial, and understanding if other family members could be at risk for cancer and allowing them to undergo genetic testing.   We reviewed the characteristics, features and inheritance patterns of hereditary cancer syndromes. We also discussed genetic testing, including the appropriate family members to test, the process of testing, insurance coverage and turn-around-time for results. We discussed the implications of a negative, positive, carrier and/or variant of uncertain significant result. We recommended Ms. Baumeister pursue genetic testing for a panel that includes genes associated with breast and pancreatic cancer.   Ms. Kuss elected to have [facility] CancerNext Panel. The CancerNext gene panel offered by [facility] includes sequencing, rearrangement analysis, and RNA analysis for the following 34 genes:   APC, ATM, AXIN2, BARD1, BMPR1A, BRCA1, BRCA2, BRIP1, CDH1, CDK4, CDKN2A, CHEK2, DICER1, HOXB13, EPCAM, GREM1, MLH1, MSH2, MSH3, MSH6, MUTYH, NF1, NTHL1, PALB2, PMS2, POLD1, POLE, PTEN, RAD51C, RAD51D, SMAD4, SMARCA4, STK11, and TP53.   Based on Ms. Asebedo's personal and family history of cancer, she meets medical criteria for genetic testing. There should be no  cost for testing based on her insurance, Medicare Part A and B.  PLAN: After considering the risks, benefits, and limitations, Ms. Askeland provided informed consent to pursue genetic testing and the blood sample was sent to [facility] for analysis of the CancerNext Panel. Results should be available within approximately 2-3 weeks' time, at which point they will be disclosed by telephone to Ms. Boyter, as will any additional recommendations warranted by these results. Ms. Sivers will receive a summary of her genetic counseling visit and a copy of her results once available. This information will also be available in Epic.   Ms. Kolbe questions were answered to her satisfaction today. Our contact information was provided should additional questions or concerns arise. Thank you for the referral and allowing [place] to share in the care of your patient.   Lalla Brothers, MS, [facility] Genetic Counselor [email or web address] (P) [phone]  The patient was seen for a total of 20 minutes in face-to-face genetic counseling. The patient was seen alone.  Drs. Pamelia Hoit and/or Mosetta Putt were available to discuss this case as needed.   _______________________________________________________________________ For Office Staff:  Number of people involved in session: 1 Was an Intern/ student involved with case: no

## 2023-07-13 ENCOUNTER — Encounter: Payer: Self-pay | Admitting: General Practice

## 2023-07-13 ENCOUNTER — Encounter: Payer: Self-pay | Admitting: *Deleted

## 2023-07-13 NOTE — Addendum Note (Signed)
Encounter addended by: Dorothy Puffer, MD on: 07/13/2023 3:49 PM  Actions taken: Clinical Note Signed

## 2023-07-13 NOTE — Progress Notes (Signed)
[facility] Multidisciplinary Clinic Spiritual Care Note  Met with [name] [name] by phone following Breast Multidisciplinary Clinic to introduce Support Center team/resources.  She completed SDOH screening; results follow below.  SDOH Interventions    Flowsheet Row Clinical Support from 08/19/2022 in [facility]  SDOH Interventions   Food Insecurity Interventions Intervention Not Indicated  Housing Interventions Intervention Not Indicated  Transportation Interventions Intervention Not Indicated  Financial Strain Interventions Intervention Not Indicated  Physical Activity Interventions Intervention Not Indicated  Stress Interventions Intervention Not Indicated  Social Connections Interventions Intervention Not Indicated       SDOH Screenings   Food Insecurity: No Food Insecurity (07/13/2023)  Housing: Low Risk  (07/13/2023)  Transportation Needs: No Transportation Needs (07/13/2023)  Utilities: Not At Risk (07/13/2023)  Depression (PHQ2-9): Low Risk  (01/30/2023)  Financial Resource Strain: Low Risk  (08/19/2022)  Physical Activity: Inactive (08/19/2022)  Social Connections: Socially Isolated (08/19/2022)  Stress: No Stress Concern Present (08/19/2022)  Tobacco Use: Low Risk  (07/12/2023)   Received from [facility] and patient discussed common feelings and emotions when being diagnosed with cancer, and the importance of support during treatment.  Chaplain informed patient of the support team and support services at [facility]  Chaplain provided contact information and encouraged patient to call with any questions or concerns.  [name] [name] reports little distress, as well as familiarity with what to expect because she supported her mother through breast cancer treatment.  Follow up needed: No. Per [name] [name], no other needs at this time, but she knows to reach out as needed/desired.   [address] [place], [place], [facility] Pager [phone] Voicemail  [phone]

## 2023-07-14 ENCOUNTER — Institutional Professional Consult (permissible substitution): Payer: Medicare Other | Admitting: Plastic Surgery

## 2023-07-14 ENCOUNTER — Telehealth: Payer: Self-pay | Admitting: Plastic Surgery

## 2023-07-14 NOTE — Telephone Encounter (Signed)
Pt missed appointment this morning and is calling back to reschedule. Can she be squezed in or does she need next available?

## 2023-07-16 ENCOUNTER — Ambulatory Visit
Admission: RE | Admit: 2023-07-16 | Discharge: 2023-07-16 | Disposition: A | Discharge status: Home / Self Care | Payer: Medicare Other | Source: Ambulatory Visit | Attending: Hematology and Oncology | Admitting: Hematology and Oncology

## 2023-07-16 DIAGNOSIS — C50211 Malignant neoplasm of upper-inner quadrant of right female breast: Secondary | ICD-10-CM | POA: Diagnosis not present

## 2023-07-16 DIAGNOSIS — Z170 Estrogen receptor positive status [ER+]: Secondary | ICD-10-CM

## 2023-07-16 MED ORDER — GADOPICLENOL 0.5 MMOL/ML IV SOLN
6.0000 mL | Freq: Once | INTRAVENOUS | Status: AC | PRN
  Administered 2023-07-16: 6 mL via INTRAVENOUS

## 2023-07-17 ENCOUNTER — Encounter: Payer: Self-pay | Admitting: *Deleted

## 2023-07-17 ENCOUNTER — Telehealth: Payer: Self-pay | Admitting: *Deleted

## 2023-07-17 NOTE — Telephone Encounter (Signed)
Left vm regarding bmdc from 07/12/23. Contact information provided for questions or needs.

## 2023-07-18 DIAGNOSIS — Z961 Presence of intraocular lens: Secondary | ICD-10-CM | POA: Diagnosis not present

## 2023-07-18 DIAGNOSIS — H26492 Other secondary cataract, left eye: Secondary | ICD-10-CM | POA: Diagnosis not present

## 2023-07-18 DIAGNOSIS — H18413 Arcus senilis, bilateral: Secondary | ICD-10-CM | POA: Diagnosis not present

## 2023-07-18 DIAGNOSIS — H40013 Open angle with borderline findings, low risk, bilateral: Secondary | ICD-10-CM | POA: Diagnosis not present

## 2023-07-19 ENCOUNTER — Encounter: Payer: Self-pay | Admitting: Plastic Surgery

## 2023-07-19 ENCOUNTER — Ambulatory Visit (INDEPENDENT_AMBULATORY_CARE_PROVIDER_SITE_OTHER): Payer: Medicare Other | Admitting: Plastic Surgery

## 2023-07-19 VITALS — BP 138/75 | HR 65 | Ht 62.0 in | Wt 140.2 lb

## 2023-07-19 DIAGNOSIS — Z170 Estrogen receptor positive status [ER+]: Secondary | ICD-10-CM | POA: Diagnosis not present

## 2023-07-19 DIAGNOSIS — C50811 Malignant neoplasm of overlapping sites of right female breast: Secondary | ICD-10-CM | POA: Diagnosis not present

## 2023-07-19 NOTE — Progress Notes (Signed)
Patient ID: [name] [name], female    DOB: 06-Jan-1951, 72 y.o.   MRN: [id number]   Chief Complaint  Patient presents with   Advice Only   Breast Problem   Breast Cancer    The patient is a 72 year old female here for consultation for breast reconstruction.  The patient felt mass in the right breast and went for a workup.  She was diagnosed with right breast cancer of the upper inner quadrant.  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  The lesion is HER2 negative.  Ki-67 of 10%.  She is not a smoker.  She does have a family history of breast cancer in her mom and a family history of pancreatic cancer. Past medical history is positive for anxiety, muscular dystrophy, gastric reflux, hypercholesterolemia, and hypertension.  Her past surgical history includes a breast augmentation with implant placement, hysterectomy and vascular catheterization.  The patient had implants placed in 1999 by a plastic surgeon here in town.  She believes that they are saline but she is not sure about the size.  The reference number is [facility] [phone].  She is not sure if they are under or above the muscle.  She went from in a cup to a B cup.  She would like to be around the same size.    Review of Systems  Constitutional: Negative.   HENT: Negative.    Eyes: Negative.   Respiratory: Negative.    Cardiovascular: Negative.   Gastrointestinal: Negative.   Endocrine: Negative.   Genitourinary: Negative.   Musculoskeletal: Negative.   Hematological: Negative.     Past Medical History:  Diagnosis Date   Anxiety    Breast cancer (HCC)    Colonic polyp    FMD (facioscapulohumeral muscular dystrophy) (HCC)    GERD (gastroesophageal reflux disease)    Headache(784.0)    Hypercholesterolemia    Hypertension    Idiopathic urticaria    Varicose veins of lower extremities     Past Surgical History:  Procedure Laterality Date   AUGMENTATION MAMMAPLASTY  Bilateral    benign breast biopsy     bilateral breast implants     BREAST BIOPSY     unsure which breast and when   BREAST BIOPSY Right 07/03/2023   [place] RT BREAST BX W LOC DEV EA ADD LESION IMG BX SPEC US GUIDE 07/03/2023 [facility]   BREAST BIOPSY Right 07/03/2023   [place] RT BREAST BX W LOC DEV 1ST LESION IMG BX SPEC US GUIDE 07/03/2023 [facility]   cataract right eye w/ lens implant     COLONOSCOPY  03/20/2023   COLONOSCOPY  2012   Dr Jarold Motto, normal   excision of large anal polyp     PERIPHERAL VASCULAR CATHETERIZATION N/A 02/11/2016   Procedure: Renal Angiography;  Surgeon: Fransisco Hertz, MD;  Location: [facility] INVASIVE CV LAB;  Service: Cardiovascular;  Laterality: N/A;   POLYPECTOMY     VAGINAL HYSTERECTOMY     varicose vein        Current Outpatient Medications:    amLODipine (NORVASC) 5 MG tablet, TAKE 1 TABLET BY MOUTH EVERY DAY, Disp: 90 tablet, Rfl: 1   aspirin EC 81 MG tablet, Take 81 mg by mouth daily., Disp: , Rfl:    BIOTIN PO, Take by mouth., Disp: , Rfl:    cholecalciferol (VITAMIN D) 1000 units tablet, Take 1,000 Units by mouth daily., Disp: , Rfl:    estradiol (  ESTRACE) 0.5 MG tablet, Take 0.5 mg by mouth daily. Daily for 21 days, then do not take for 7 days, Disp: , Rfl:    nebivolol (BYSTOLIC) 10 MG tablet, TAKE 1 TABLET BY MOUTH EVERY DAY, Disp: 90 tablet, Rfl: 3   rosuvastatin (CRESTOR) 10 MG tablet, TAKE 1 TABLET BY MOUTH EVERY DAY, Disp: 90 tablet, Rfl: 3   venlafaxine XR (EFFEXOR-XR) 37.5 MG 24 hr capsule, Take 1 capsule (37.5 mg total) by mouth daily with breakfast., Disp: 30 capsule, Rfl: 1   vitamin E 400 UNIT capsule, Take 400 Units by mouth daily., Disp: , Rfl:    Objective:   There were no vitals filed for this visit.  Physical Exam Vitals and nursing note reviewed.  Constitutional:      Appearance: Normal appearance.  HENT:     Head: Normocephalic and atraumatic.  Cardiovascular:     Rate and Rhythm: Normal rate.     Pulses:  Normal pulses.  Pulmonary:     Effort: Pulmonary effort is normal.  Abdominal:     General: There is no distension.     Palpations: Abdomen is soft.  Musculoskeletal:        General: Tenderness present. No deformity.  Skin:    General: Skin is warm.     Capillary Refill: Capillary refill takes less than 2 seconds.     Coloration: Skin is not jaundiced.     Findings: No bruising.  Neurological:     Mental Status: She is alert and oriented to person, place, and time.  Psychiatric:        Mood and Affect: Mood normal.        Behavior: Behavior normal.        Thought Content: Thought content normal.        Judgment: Judgment normal.     Assessment & Plan:  Malignant neoplasm of overlapping sites of right breast in female, estrogen receptor positive (HCC)  The options for reconstruction we explained to the patient / family for breast reconstruction.  There are two general categories of reconstruction.  We can reconstruction a breast with implants or use the patient's own tissue.  These were further discussed as listed.  Breast reconstruction is an optional procedure and eligibility depends on the full spectrum of the health of the patient and any co-morbidities.  More than one surgery is often needed to complete the reconstruction process.  The process can take three to twelve months to complete.  The breasts will not be identical due to many factors such as rib differences, shoulder asymmetry and treatments such as radiation.  The goal is to get the breasts to look normal and symmetrical in clothes.  Scars are a part of surgery and may fade some in time but will always be present under clothes.  Surgery may be an option on the non-cancer breast to achieve more symmetry.  No matter which procedure is chosen there is always the risk of complications and even failure of the body to heal.  This could result in no breast.    The options for reconstruction include:  1. Placement of a tissue  expander with Acellular dermal matrix. When the expander is the desired size surgery is performed to remove the expander and place an implant.  In some cases the implant can be placed without an expander.  2. Autologous reconstruction can include using a muscle or tissue from another area of the body to create a breast.  3. Combined procedures (  ie. latissismus dorsi flap) can be done with an expander / implant placed under the muscle.   The risks, benefits, scars and recovery time were discussed for each of the above. Risks include bleeding, infection, hematoma, seroma, scarring, pain, wound healing complications, flap loss, fat necrosis, capsular contracture, need for implant removal, donor site complications, bulge, hernia, umbilical necrosis, need for urgent reoperation, and need for dressing changes.   The procedure the patient selected / that was best for the patient, was then discussed in further detail.  Total time: 45 minutes. This includes time spent with the patient during the visit as well as time spent before and after the visit reviewing the chart, documenting the encounter, making phone calls and reviewing studies.   Patient will likely go with a right breast partial mastectomy.  She had an MRI of the left breast and is waiting on those results.  For the moment she would like to have reconstruction with bilateral implants.  She would like to go with silicone.  She would like to be around about the same size.  I have spoken with Dr. Carolynne Edouard about the above information.  If we cannot do this at the same time the backup plan would be to do it separately.  Pictures were obtained of the patient and placed in the chart with the patient's or guardian's permission.   Alena Bills Tijuana Scheidegger, DO

## 2023-07-20 ENCOUNTER — Institutional Professional Consult (permissible substitution): Payer: Medicare Other | Admitting: Plastic Surgery

## 2023-07-25 ENCOUNTER — Telehealth: Payer: Self-pay

## 2023-07-25 NOTE — Telephone Encounter (Signed)
Pt LVM stating that she would like to speak with a provider regarding her most recent scan from 10/27.

## 2023-07-26 DIAGNOSIS — Z961 Presence of intraocular lens: Secondary | ICD-10-CM | POA: Diagnosis not present

## 2023-07-27 ENCOUNTER — Encounter: Payer: Self-pay | Admitting: *Deleted

## 2023-07-28 ENCOUNTER — Telehealth: Payer: Self-pay | Admitting: Genetic Counselor

## 2023-07-28 ENCOUNTER — Encounter: Payer: Self-pay | Admitting: Genetic Counselor

## 2023-07-28 DIAGNOSIS — Z1379 Encounter for other screening for genetic and chromosomal anomalies: Secondary | ICD-10-CM | POA: Insufficient documentation

## 2023-07-28 NOTE — Telephone Encounter (Signed)
I contacted Ms. Legg to discuss her genetic testing results. No pathogenic variants were identified in the 34 genes analyzed. Detailed clinic note to follow.  The test report has been scanned into EPIC and is located under the Molecular Pathology section of the Results Review tab.  A portion of the result report is included below for reference.   Lalla Brothers, MS, [facility] Genetic Counselor [email or web address] (P) [phone]

## 2023-07-31 ENCOUNTER — Telehealth: Payer: Self-pay | Admitting: Hematology and Oncology

## 2023-07-31 ENCOUNTER — Other Ambulatory Visit: Payer: Self-pay | Admitting: General Surgery

## 2023-07-31 ENCOUNTER — Encounter: Payer: Self-pay | Admitting: *Deleted

## 2023-07-31 DIAGNOSIS — C50811 Malignant neoplasm of overlapping sites of right female breast: Secondary | ICD-10-CM

## 2023-07-31 NOTE — Telephone Encounter (Signed)
Patient is aware of scheduled appointment times/dates for follow up with Dr. Al Pimple

## 2023-08-01 ENCOUNTER — Telehealth: Payer: Self-pay | Admitting: *Deleted

## 2023-08-01 NOTE — Telephone Encounter (Signed)
Attempted to call patient to answer some questions she had regarding appts and surgery with Dr. Carolynne Edouard.  Left VM with return contact info.

## 2023-08-02 ENCOUNTER — Encounter: Payer: Self-pay | Admitting: Surgical

## 2023-08-02 ENCOUNTER — Ambulatory Visit (INDEPENDENT_AMBULATORY_CARE_PROVIDER_SITE_OTHER): Payer: Medicare Other | Admitting: Surgical

## 2023-08-02 VITALS — BP 144/67 | HR 67 | Ht 62.0 in | Wt 140.4 lb

## 2023-08-02 DIAGNOSIS — C50811 Malignant neoplasm of overlapping sites of right female breast: Secondary | ICD-10-CM

## 2023-08-02 DIAGNOSIS — Z170 Estrogen receptor positive status [ER+]: Secondary | ICD-10-CM

## 2023-08-02 MED ORDER — CEPHALEXIN 500 MG PO CAPS: 500.0000 mg | ORAL_CAPSULE | Freq: Four times a day (QID) | ORAL | 0 refills | Status: AC

## 2023-08-02 MED ORDER — ONDANSETRON HCL 4 MG PO TABS: 4.0000 mg | ORAL_TABLET | Freq: Three times a day (TID) | ORAL | 0 refills | Status: DC | PRN

## 2023-08-02 MED ORDER — TRAMADOL HCL 50 MG PO TABS: 50.0000 mg | ORAL_TABLET | Freq: Three times a day (TID) | ORAL | 0 refills | Status: DC | PRN

## 2023-08-02 NOTE — H&P (View-Only) (Signed)
Patient ID: [name] [name], female    DOB: 09/25/1950, 72 y.o.   MRN: [id number]  Chief Complaint  Patient presents with   Pre-op Exam      ICD-10-CM   1. Malignant neoplasm of overlapping sites of right breast in female, estrogen receptor positive (HCC)  C50.811    Z17.0       History of Present Illness: [name] [name] is a 72 y.o.  female  with a history of right breast cancer.  She presents for preoperative evaluation for upcoming procedure, bilateral removal of breast implants, bilateral mastopexy and placement of silicone breast implants bilaterally, scheduled for 08/11/2023 with Dr. Ulice Bold in conjunction with a right breast radioactive seed localized lumpectomy x 2 with Dr. Carolynne Edouard.  The patient has not had problems with anesthesia. No history of DVT/PE.  No family history of DVT/PE.  No family history of bleeding or clotting disorders.  Patient is not currently taking any blood thinners.  No history of CVA/MI.   Patient's mother does have a history of a DVT in her right lower extremity, patient reports she had a right knee replacement and did not follow postoperative protocol, was not ambulating and developed a blood clot.  No other known family history of DVT.  Summary of Previous Visit: Patient felt a mass in the right breast, went for workup, diagnosed with right breast cancer of the upper inner quadrant.  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  She has a family of breast cancer in her mom and a family history of pancreatic cancer.  She had a breast augmentation with implant placement in 1999.  She believes they are saline but not sure about the size.  PMH Significant for: Hypertension, on ASA 81 mg daily, history of breast cancer, GERD, varicose veins of lower extremities  Patient reports that she is not aware of any varicose veins in her lower extremities, she does report that she has spider veins.  She denies any lower extremity  swelling.  She believes her implants are under the muscle, but she has not sure.  She reports she takes ASA 81 mg daily for preventative reasons, but denies any cardiac or pulmonary disease.   Past Medical History: Allergies: Allergies  Allergen Reactions   Trazodone And Nefazodone     Dizziness even at low doses    Current Medications:  Current Outpatient Medications:    amLODipine (NORVASC) 5 MG tablet, TAKE 1 TABLET BY MOUTH EVERY DAY, Disp: 90 tablet, Rfl: 1   aspirin EC 81 MG tablet, Take 81 mg by mouth daily., Disp: , Rfl:    BIOTIN PO, Take by mouth., Disp: , Rfl:    cephALEXin (KEFLEX) 500 MG capsule, Take 1 capsule (500 mg total) by mouth 4 (four) times daily for 5 days., Disp: 20 capsule, Rfl: 0   cholecalciferol (VITAMIN D) 1000 units tablet, Take 1,000 Units by mouth daily., Disp: , Rfl:    nebivolol (BYSTOLIC) 10 MG tablet, TAKE 1 TABLET BY MOUTH EVERY DAY, Disp: 90 tablet, Rfl: 3   ondansetron (ZOFRAN) 4 MG tablet, Take 1 tablet (4 mg total) by mouth every 8 (eight) hours as needed for nausea or vomiting., Disp: 20 tablet, Rfl: 0   rosuvastatin (CRESTOR) 10 MG tablet, TAKE 1 TABLET BY MOUTH EVERY DAY, Disp: 90 tablet, Rfl: 3   traMADol (ULTRAM) 50 MG tablet, Take 1 tablet (50 mg total) by mouth every 8 (eight) hours  as needed for up to 10 doses., Disp: 10 tablet, Rfl: 0   vitamin E 400 UNIT capsule, Take 400 Units by mouth daily., Disp: , Rfl:    prednisoLONE acetate (PRED FORTE) 1 % ophthalmic suspension, SMARTSIG:In Eye(s), Disp: , Rfl:    venlafaxine XR (EFFEXOR-XR) 37.5 MG 24 hr capsule, Take 1 capsule (37.5 mg total) by mouth daily with breakfast., Disp: 30 capsule, Rfl: 1  Past Medical Problems: Past Medical History:  Diagnosis Date   Anxiety    Breast cancer (HCC)    Colonic polyp    FMD (facioscapulohumeral muscular dystrophy) (HCC)    GERD (gastroesophageal reflux disease)    Headache(784.0)    Hypercholesterolemia    Hypertension    Idiopathic urticaria     Varicose veins of lower extremities     Past Surgical History: Past Surgical History:  Procedure Laterality Date   AUGMENTATION MAMMAPLASTY Bilateral    benign breast biopsy     bilateral breast implants     BREAST BIOPSY     unsure which breast and when   BREAST BIOPSY Right 07/03/2023   [place] RT BREAST BX W LOC DEV EA ADD LESION IMG BX SPEC US GUIDE 07/03/2023 [facility]   BREAST BIOPSY Right 07/03/2023   [place] RT BREAST BX W LOC DEV 1ST LESION IMG BX SPEC US GUIDE 07/03/2023 [facility]   cataract right eye w/ lens implant     COLONOSCOPY  03/20/2023   COLONOSCOPY  2012   Dr Jarold Motto, normal   excision of large anal polyp     PERIPHERAL VASCULAR CATHETERIZATION N/A 02/11/2016   Procedure: Renal Angiography;  Surgeon: Fransisco Hertz, MD;  Location: [facility] INVASIVE CV LAB;  Service: Cardiovascular;  Laterality: N/A;   POLYPECTOMY     VAGINAL HYSTERECTOMY     varicose vein      Social History: Social History   Socioeconomic History   Marital status: Single    Spouse name: Not on file   Number of children: 0   Years of education: 12   Highest education level: Not on file  Occupational History    Comment: [employer]  Tobacco Use   Smoking status: Never   Smokeless tobacco: Never  Vaping Use   Vaping status: Never Used  Substance and Sexual Activity   Alcohol use: No    Alcohol/week: 0.0 standard drinks of alcohol   Drug use: No   Sexual activity: Not on file  Other Topics Concern   Not on file  Social History Narrative   Lives alone   No caffeine use   Social Determinants of Health   Financial Resource Strain: Low Risk  (08/19/2022)   Overall Financial Resource Strain (CARDIA)    Difficulty of Paying Living Expenses: Not hard at all  Food Insecurity: No Food Insecurity (07/13/2023)   Hunger Vital Sign    Worried About Running Out of Food in the Last Year: Never true    Ran Out of Food in the Last Year: Never true  Transportation Needs: No Transportation  Needs (07/13/2023)   PRAPARE - Administrator, Civil Service (Medical): No    Lack of Transportation (Non-Medical): No  Physical Activity: Inactive (08/19/2022)   Exercise Vital Sign    Days of Exercise per Week: 0 days    Minutes of Exercise per Session: 0 min  Stress: No Stress Concern Present (08/19/2022)   [employer] of Occupational Health - Occupational Stress Questionnaire    Feeling of Stress :  Not at all  Social Connections: Socially Isolated (08/19/2022)   Social Connection and Isolation Panel [NHANES]    Frequency of Communication with Friends and Family: More than three times a week    Frequency of Social Gatherings with Friends and Family: Once a week    Attends Religious Services: Never    Database administrator or Organizations: No    Attends Banker Meetings: Never    Marital Status: Divorced  Catering manager Violence: Not At Risk (07/13/2023)   Humiliation, Afraid, Rape, and Kick questionnaire    Fear of Current or Ex-Partner: No    Emotionally Abused: No    Physically Abused: No    Sexually Abused: No    Family History: Family History  Problem Relation Age of Onset   Hypertension Mother    Hyperlipidemia Mother    Diabetes Mother    Breast cancer Mother 13   Pulmonary embolism Mother    Deep vein thrombosis Mother    Pancreatic cancer Mother 72   Dementia Father    Leukemia Maternal Aunt    Sickle cell anemia Cousin    Colon cancer Neg Hx    Esophageal cancer Neg Hx    Stomach cancer Neg Hx    Colon polyps Neg Hx    Rectal cancer Neg Hx     Review of Systems: Review of Systems  Constitutional: Negative.   Respiratory: Negative.    Cardiovascular: Negative.   Gastrointestinal: Negative.   Genitourinary: Negative.   Neurological: Negative.     Physical Exam: Vital Signs BP (!) 144/67 (BP Location: Left Arm, Patient Position: Sitting, Cuff Size: Normal)   Pulse 67   Ht 5\' 2"  (1.575 m)   Wt 140 lb 6.4 oz (63.7 kg)    LMP  (LMP Unknown)   SpO2 97%   BMI 25.68 kg/m   Physical Exam  Constitutional:      General: Not in acute distress.    Appearance: Normal appearance. Not ill-appearing.  HENT:     Head: Normocephalic and atraumatic.  Eyes:     Pupils: Pupils are equal, round Neck:     Musculoskeletal: Normal range of motion.  Cardiovascular:     Rate and Rhythm: Normal rate    Pulses: Normal pulses.  Pulmonary:     Effort: Pulmonary effort is normal. No respiratory distress.  Abdominal:     General: Abdomen is flat. There is no distension.  Musculoskeletal: Normal range of motion.  Skin:    General: Skin is warm and dry.     Findings: No erythema or rash.  Neurological:     General: No focal deficit present.     Mental Status: Alert and oriented to person, place, and time. Mental status is at baseline.     Motor: No weakness.  Psychiatric:        Mood and Affect: Mood normal.        Behavior: Behavior normal.    Assessment/Plan: The patient is scheduled for removal of bilateral breast implants, bilateral mastopexy and placement of bilateral silicone breast implants with Dr. Ulice Bold.  Risks, benefits, and alternatives of procedure discussed, questions answered and consent obtained.    Smoking Status: Non-smoker; Counseling Given?  N/A  Patient had mammogram October of this year with subsequent ultrasounds of right breast and MRI.  Pathology showed invasive mammary carcinoma on right.  No evidence of left breast malignancy on MRI.  Caprini Score: 10; Risk Factors include: Age, BMI greater than 25, history  of right breast cancer, mother with history of DVT (was provoked by right knee replacement surgery), and length of planned surgery. Recommendation for mechanical and possible pharmacological prophylaxis. Encourage early ambulation.   Pictures obtained: @consult   Post-op Rx sent to pharmacy: Tramadol, Zofran, Keflex  Patient was provided with the General Surgical Risk consent  document and Pain Medication Agreement prior to their appointment.  They had adequate time to read through the risk consent documents and Pain Medication Agreement. We also discussed them in person together during this preop appointment. All of their questions were answered to their satisfaction.  Recommended calling if they have any further questions.  Risk consent form and Pain Medication Agreement to be scanned into patient's chart.  Patient was provided with the Mentor implant patient decision checklist and this was completed during today's preoperative evaluation. Patient had time to read through the information and any questions were answered to their content. Form will be scanned into patient's chart.  The risks that can be encountered with and after placement of a breast implant were discussed and include the following but not limited to these: bleeding, infection, delayed healing, anesthesia risks, skin sensation changes, injury to structures including nerves, blood vessels, and muscles which may be temporary or permanent, allergies to tape, suture materials and glues, blood products, topical preparations or injected agents, skin contour irregularities, skin discoloration and swelling, deep vein thrombosis, cardiac and pulmonary complications, pain, which may persist, fluid accumulation, wrinkling of the skin over the implanmt, changes in nipple or breast sensation, implant leakage or rupture, faulty position of the implant, persistent pain, formation of tight scar tissue around the implant (capsular contracture).  The risk that can be encountered with mastopexy/breast lift were discussed and include the following but not limited to these:  Breast asymmetry, fluid accumulation, firmness of the breast, inability to breast feed, loss of nipple or areola, skin loss, decrease or no nipple sensation, fat necrosis of the breast tissue, bleeding, infection, healing delay.  There are risks of anesthesia,  changes to skin sensation and injury to nerves or blood vessels.  The muscle can be temporarily or permanently injured.  You may have an allergic reaction to tape, suture, glue, blood products which can result in skin discoloration, swelling, pain, skin lesions, poor healing.  Any of these can lead to the need for revisonal surgery or stage procedures.  A mastopexy has potential to interfere with diagnostic procedures.  Nipple or breast piercing can increase risks of infection.  This procedure is best done when the breast is fully developed.  Changes in the breast will continue to occur over time.  Pregnancy can alter the outcomes of previous breast surgery, weight gain and weigh loss can also effect the long term appearance.   Discussed holding ASA 81 mg, vitamin E and vitamin D 1 week prior to procedure.  Electronically signed by: Kermit Balo Janit Cutter, PA-C 08/02/2023 3:33 PM

## 2023-08-02 NOTE — Progress Notes (Signed)
Patient ID: [name] [name], female    DOB: February 24, 1951, 72 y.o.   MRN: [id number]  Chief Complaint  Patient presents with   Pre-op Exam      ICD-10-CM   1. Malignant neoplasm of overlapping sites of right breast in female, estrogen receptor positive (HCC)  C50.811    Z17.0       History of Present Illness: [name] [name] is a 72 y.o.  female  with a history of right breast cancer.  She presents for preoperative evaluation for upcoming procedure, bilateral removal of breast implants, bilateral mastopexy and placement of silicone breast implants bilaterally, scheduled for 08/11/2023 with Dr. Ulice Bold in conjunction with a right breast radioactive seed localized lumpectomy x 2 with Dr. Carolynne Edouard.  The patient has not had problems with anesthesia. No history of DVT/PE.  No family history of DVT/PE.  No family history of bleeding or clotting disorders.  Patient is not currently taking any blood thinners.  No history of CVA/MI.   Patient's mother does have a history of a DVT in her right lower extremity, patient reports she had a right knee replacement and did not follow postoperative protocol, was not ambulating and developed a blood clot.  No other known family history of DVT.  Summary of Previous Visit: Patient felt a mass in the right breast, went for workup, diagnosed with right breast cancer of the upper inner quadrant.  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  She has a family of breast cancer in her mom and a family history of pancreatic cancer.  She had a breast augmentation with implant placement in 1999.  She believes they are saline but not sure about the size.  PMH Significant for: Hypertension, on ASA 81 mg daily, history of breast cancer, GERD, varicose veins of lower extremities  Patient reports that she is not aware of any varicose veins in her lower extremities, she does report that she has spider veins.  She denies any lower extremity  swelling.  She believes her implants are under the muscle, but she has not sure.  She reports she takes ASA 81 mg daily for preventative reasons, but denies any cardiac or pulmonary disease.   Past Medical History: Allergies: Allergies  Allergen Reactions   Trazodone And Nefazodone     Dizziness even at low doses    Current Medications:  Current Outpatient Medications:    amLODipine (NORVASC) 5 MG tablet, TAKE 1 TABLET BY MOUTH EVERY DAY, Disp: 90 tablet, Rfl: 1   aspirin EC 81 MG tablet, Take 81 mg by mouth daily., Disp: , Rfl:    BIOTIN PO, Take by mouth., Disp: , Rfl:    cephALEXin (KEFLEX) 500 MG capsule, Take 1 capsule (500 mg total) by mouth 4 (four) times daily for 5 days., Disp: 20 capsule, Rfl: 0   cholecalciferol (VITAMIN D) 1000 units tablet, Take 1,000 Units by mouth daily., Disp: , Rfl:    nebivolol (BYSTOLIC) 10 MG tablet, TAKE 1 TABLET BY MOUTH EVERY DAY, Disp: 90 tablet, Rfl: 3   ondansetron (ZOFRAN) 4 MG tablet, Take 1 tablet (4 mg total) by mouth every 8 (eight) hours as needed for nausea or vomiting., Disp: 20 tablet, Rfl: 0   rosuvastatin (CRESTOR) 10 MG tablet, TAKE 1 TABLET BY MOUTH EVERY DAY, Disp: 90 tablet, Rfl: 3   traMADol (ULTRAM) 50 MG tablet, Take 1 tablet (50 mg total) by mouth every 8 (eight) hours  as needed for up to 10 doses., Disp: 10 tablet, Rfl: 0   vitamin E 400 UNIT capsule, Take 400 Units by mouth daily., Disp: , Rfl:    prednisoLONE acetate (PRED FORTE) 1 % ophthalmic suspension, SMARTSIG:In Eye(s), Disp: , Rfl:    venlafaxine XR (EFFEXOR-XR) 37.5 MG 24 hr capsule, Take 1 capsule (37.5 mg total) by mouth daily with breakfast., Disp: 30 capsule, Rfl: 1  Past Medical Problems: Past Medical History:  Diagnosis Date   Anxiety    Breast cancer (HCC)    Colonic polyp    FMD (facioscapulohumeral muscular dystrophy) (HCC)    GERD (gastroesophageal reflux disease)    Headache(784.0)    Hypercholesterolemia    Hypertension    Idiopathic urticaria     Varicose veins of lower extremities     Past Surgical History: Past Surgical History:  Procedure Laterality Date   AUGMENTATION MAMMAPLASTY Bilateral    benign breast biopsy     bilateral breast implants     BREAST BIOPSY     unsure which breast and when   BREAST BIOPSY Right 07/03/2023   [place] RT BREAST BX W LOC DEV EA ADD LESION IMG BX SPEC US GUIDE 07/03/2023 [facility]   BREAST BIOPSY Right 07/03/2023   [place] RT BREAST BX W LOC DEV 1ST LESION IMG BX SPEC US GUIDE 07/03/2023 [facility]   cataract right eye w/ lens implant     COLONOSCOPY  03/20/2023   COLONOSCOPY  2012   Dr Jarold Motto, normal   excision of large anal polyp     PERIPHERAL VASCULAR CATHETERIZATION N/A 02/11/2016   Procedure: Renal Angiography;  Surgeon: Fransisco Hertz, MD;  Location: [facility] INVASIVE CV LAB;  Service: Cardiovascular;  Laterality: N/A;   POLYPECTOMY     VAGINAL HYSTERECTOMY     varicose vein      Social History: Social History   Socioeconomic History   Marital status: Single    Spouse name: Not on file   Number of children: 0   Years of education: 12   Highest education level: Not on file  Occupational History    Comment: [employer]  Tobacco Use   Smoking status: Never   Smokeless tobacco: Never  Vaping Use   Vaping status: Never Used  Substance and Sexual Activity   Alcohol use: No    Alcohol/week: 0.0 standard drinks of alcohol   Drug use: No   Sexual activity: Not on file  Other Topics Concern   Not on file  Social History Narrative   Lives alone   No caffeine use   Social Determinants of Health   Financial Resource Strain: Low Risk  (08/19/2022)   Overall Financial Resource Strain (CARDIA)    Difficulty of Paying Living Expenses: Not hard at all  Food Insecurity: No Food Insecurity (07/13/2023)   Hunger Vital Sign    Worried About Running Out of Food in the Last Year: Never true    Ran Out of Food in the Last Year: Never true  Transportation Needs: No Transportation  Needs (07/13/2023)   PRAPARE - Administrator, Civil Service (Medical): No    Lack of Transportation (Non-Medical): No  Physical Activity: Inactive (08/19/2022)   Exercise Vital Sign    Days of Exercise per Week: 0 days    Minutes of Exercise per Session: 0 min  Stress: No Stress Concern Present (08/19/2022)   [employer] of Occupational Health - Occupational Stress Questionnaire    Feeling of Stress :  Not at all  Social Connections: Socially Isolated (08/19/2022)   Social Connection and Isolation Panel [NHANES]    Frequency of Communication with Friends and Family: More than three times a week    Frequency of Social Gatherings with Friends and Family: Once a week    Attends Religious Services: Never    Database administrator or Organizations: No    Attends Banker Meetings: Never    Marital Status: Divorced  Catering manager Violence: Not At Risk (07/13/2023)   Humiliation, Afraid, Rape, and Kick questionnaire    Fear of Current or Ex-Partner: No    Emotionally Abused: No    Physically Abused: No    Sexually Abused: No    Family History: Family History  Problem Relation Age of Onset   Hypertension Mother    Hyperlipidemia Mother    Diabetes Mother    Breast cancer Mother 53   Pulmonary embolism Mother    Deep vein thrombosis Mother    Pancreatic cancer Mother 80   Dementia Father    Leukemia Maternal Aunt    Sickle cell anemia Cousin    Colon cancer Neg Hx    Esophageal cancer Neg Hx    Stomach cancer Neg Hx    Colon polyps Neg Hx    Rectal cancer Neg Hx     Review of Systems: Review of Systems  Constitutional: Negative.   Respiratory: Negative.    Cardiovascular: Negative.   Gastrointestinal: Negative.   Genitourinary: Negative.   Neurological: Negative.     Physical Exam: Vital Signs BP (!) 144/67 (BP Location: Left Arm, Patient Position: Sitting, Cuff Size: Normal)   Pulse 67   Ht 5\' 2"  (1.575 m)   Wt 140 lb 6.4 oz (63.7 kg)    LMP  (LMP Unknown)   SpO2 97%   BMI 25.68 kg/m   Physical Exam  Constitutional:      General: Not in acute distress.    Appearance: Normal appearance. Not ill-appearing.  HENT:     Head: Normocephalic and atraumatic.  Eyes:     Pupils: Pupils are equal, round Neck:     Musculoskeletal: Normal range of motion.  Cardiovascular:     Rate and Rhythm: Normal rate    Pulses: Normal pulses.  Pulmonary:     Effort: Pulmonary effort is normal. No respiratory distress.  Abdominal:     General: Abdomen is flat. There is no distension.  Musculoskeletal: Normal range of motion.  Skin:    General: Skin is warm and dry.     Findings: No erythema or rash.  Neurological:     General: No focal deficit present.     Mental Status: Alert and oriented to person, place, and time. Mental status is at baseline.     Motor: No weakness.  Psychiatric:        Mood and Affect: Mood normal.        Behavior: Behavior normal.    Assessment/Plan: The patient is scheduled for removal of bilateral breast implants, bilateral mastopexy and placement of bilateral silicone breast implants with Dr. Ulice Bold.  Risks, benefits, and alternatives of procedure discussed, questions answered and consent obtained.    Smoking Status: Non-smoker; Counseling Given?  N/A  Patient had mammogram October of this year with subsequent ultrasounds of right breast and MRI.  Pathology showed invasive mammary carcinoma on right.  No evidence of left breast malignancy on MRI.  Caprini Score: 10; Risk Factors include: Age, BMI greater than 25, history  of right breast cancer, mother with history of DVT (was provoked by right knee replacement surgery), and length of planned surgery. Recommendation for mechanical and possible pharmacological prophylaxis. Encourage early ambulation.   Pictures obtained: @consult   Post-op Rx sent to pharmacy: Tramadol, Zofran, Keflex  Patient was provided with the General Surgical Risk consent  document and Pain Medication Agreement prior to their appointment.  They had adequate time to read through the risk consent documents and Pain Medication Agreement. We also discussed them in person together during this preop appointment. All of their questions were answered to their satisfaction.  Recommended calling if they have any further questions.  Risk consent form and Pain Medication Agreement to be scanned into patient's chart.  Patient was provided with the [employer] implant patient decision checklist and this was completed during today's preoperative evaluation. Patient had time to read through the information and any questions were answered to their content. Form will be scanned into patient's chart.  The risks that can be encountered with and after placement of a breast implant were discussed and include the following but not limited to these: bleeding, infection, delayed healing, anesthesia risks, skin sensation changes, injury to structures including nerves, blood vessels, and muscles which may be temporary or permanent, allergies to tape, suture materials and glues, blood products, topical preparations or injected agents, skin contour irregularities, skin discoloration and swelling, deep vein thrombosis, cardiac and pulmonary complications, pain, which may persist, fluid accumulation, wrinkling of the skin over the implanmt, changes in nipple or breast sensation, implant leakage or rupture, faulty position of the implant, persistent pain, formation of tight scar tissue around the implant (capsular contracture).  The risk that can be encountered with mastopexy/breast lift were discussed and include the following but not limited to these:  Breast asymmetry, fluid accumulation, firmness of the breast, inability to breast feed, loss of nipple or areola, skin loss, decrease or no nipple sensation, fat necrosis of the breast tissue, bleeding, infection, healing delay.  There are risks of anesthesia,  changes to skin sensation and injury to nerves or blood vessels.  The muscle can be temporarily or permanently injured.  You may have an allergic reaction to tape, suture, glue, blood products which can result in skin discoloration, swelling, pain, skin lesions, poor healing.  Any of these can lead to the need for revisonal surgery or stage procedures.  A mastopexy has potential to interfere with diagnostic procedures.  Nipple or breast piercing can increase risks of infection.  This procedure is best done when the breast is fully developed.  Changes in the breast will continue to occur over time.  Pregnancy can alter the outcomes of previous breast surgery, weight gain and weigh loss can also effect the long term appearance.   Discussed holding ASA 81 mg, vitamin E and vitamin D 1 week prior to procedure.  Electronically signed by: Kermit Balo Genisis Sonnier, PA-C 08/02/2023 3:33 PM

## 2023-08-03 ENCOUNTER — Encounter (HOSPITAL_BASED_OUTPATIENT_CLINIC_OR_DEPARTMENT_OTHER): Payer: Self-pay | Admitting: General Surgery

## 2023-08-04 ENCOUNTER — Other Ambulatory Visit: Payer: Self-pay

## 2023-08-04 ENCOUNTER — Other Ambulatory Visit: Payer: Self-pay | Admitting: Hematology and Oncology

## 2023-08-04 ENCOUNTER — Encounter (HOSPITAL_BASED_OUTPATIENT_CLINIC_OR_DEPARTMENT_OTHER): Payer: Self-pay | Admitting: General Surgery

## 2023-08-04 ENCOUNTER — Ambulatory Visit: Payer: Self-pay | Admitting: Genetic Counselor

## 2023-08-04 ENCOUNTER — Telehealth: Payer: Self-pay

## 2023-08-04 ENCOUNTER — Encounter: Payer: Self-pay | Admitting: Genetic Counselor

## 2023-08-04 DIAGNOSIS — Z170 Estrogen receptor positive status [ER+]: Secondary | ICD-10-CM

## 2023-08-04 DIAGNOSIS — Z1379 Encounter for other screening for genetic and chromosomal anomalies: Secondary | ICD-10-CM

## 2023-08-04 NOTE — Telephone Encounter (Signed)
Information received and given to Dr. Ulice Bold.

## 2023-08-04 NOTE — Telephone Encounter (Signed)
Faxed ROI to Dr. Sherald Hess' office for patient's current implant information.

## 2023-08-04 NOTE — Progress Notes (Signed)
HPI:   [name] was previously seen in the [facility] due to a personal and family history of cancer and concerns regarding a hereditary predisposition to cancer. Please refer to our prior cancer genetics clinic note for more information regarding our discussion, assessment and recommendations, at the time. [name] recent genetic test results were disclosed to her, as were recommendations warranted by these results. These results and recommendations are discussed in more detail below.  CANCER HISTORY:  Oncology History  Malignant neoplasm of overlapping sites of right breast in female, estrogen receptor positive (HCC)  06/29/2023 Mammogram   Mammogram October 2024 showed right breast mass at 1:00 at the palpable site of concern 4 cm from the nipple measuring 7 x 5 x 6 mm.  Adjacent to this is a complex circumscribed mass measuring 1.6 x 1.1 x 1.2 cm.  Ultrasound of the right axilla demonstrated normal lymph nodes.   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 Report date is 07/27/2023.   The CancerNext gene panel offered by [employer] includes sequencing, rearrangement analysis, and RNA analysis for the following 34 genes:   APC, ATM, AXIN2,  BARD1, BMPR1A, BRCA1, BRCA2, BRIP1, CDH1, CDK4, CDKN2A, CHEK2, DICER1, HOXB13, EPCAM, GREM1, MLH1, MSH2, MSH3, MSH6, MUTYH, NF1, NTHL1, PALB2, PMS2, POLD1, POLE, PTEN, RAD51C, RAD51D, SMAD4, SMARCA4, STK11, and TP53.      FAMILY HISTORY:  We obtained a detailed, 4-generation family history.  Significant diagnoses are listed below:      Family History  Problem Relation Age of Onset   Hypertension Mother     Hyperlipidemia Mother     Diabetes Mother     Breast cancer Mother 37   Pulmonary embolism Mother     Deep vein thrombosis Mother     Pancreatic cancer Mother 46   Dementia Father     Leukemia Maternal Aunt     Sickle cell anemia Cousin     Colon cancer Neg Hx     Esophageal cancer Neg Hx     Stomach cancer Neg Hx     Colon polyps Neg Hx     Rectal cancer Neg Hx             [name] mother was diagnosed with breast cancer at age 64 and pancreatic cancer at age 82, she died at age 57. Her maternal aunt was diagnosed with leukemia at an unknown age, she is deceased. [name] reports her sister had genetic testing but she does not know the results. There is no reported Ashkenazi Jewish ancestry.   GENETIC TEST RESULTS:  The [facility] CancerNext Panel found no pathogenic mutations.  The CancerNext gene panel offered by [employer] includes sequencing, rearrangement analysis, and RNA analysis for the following 34 genes:   APC, ATM, AXIN2, BARD1, BMPR1A,  BRCA1, BRCA2, BRIP1, CDH1, CDK4, CDKN2A, CHEK2, DICER1, HOXB13, EPCAM, GREM1, MLH1, MSH2, MSH3, MSH6, MUTYH, NF1, NTHL1, PALB2, PMS2, POLD1, POLE, PTEN, RAD51C, RAD51D, SMAD4, SMARCA4, STK11, and TP53.     The test report has been scanned into EPIC and is located under the Molecular Pathology section of the Results Review tab.  A portion of the result report is included below for reference. Genetic testing reported out on 07/27/2023.       Even though a pathogenic variant was not identified, possible explanations for the  cancer in the family may include: There may be no hereditary risk for cancer in the family. The cancers in [name] and/or her family may be due to other genetic or environmental factors. There may be a gene mutation in one of these genes that current testing methods cannot detect, but that chance is small. There could be another gene that has not yet been discovered, or that we have not yet tested, that is responsible for the cancer diagnoses in the family.  It is also possible there is a hereditary cause for the cancer in the family that [name] did not inherit.  Therefore, it is important to remain in touch with cancer genetics in the future so that we can continue to offer [name] the most up to date genetic testing.   ADDITIONAL GENETIC TESTING:  We discussed with [name] that her genetic testing was fairly extensive.  If there are genes identified to increase cancer risk that can be analyzed in the future, we would be happy to discuss and coordinate this testing at that time.    CANCER SCREENING RECOMMENDATIONS:  [name] test result is considered negative (normal).  This means that we have not identified a hereditary cause for her personal and family history of cancer at this time.   An individual's cancer risk and medical management are not determined by genetic test results alone. Overall cancer risk assessment incorporates additional factors, including personal medical history, family history, and any available genetic information that may result in a personalized plan for cancer prevention and surveillance. Therefore, it is recommended she continue to follow the cancer management and screening guidelines provided by her oncology and primary healthcare provider.  RECOMMENDATIONS FOR FAMILY MEMBERS:   Individuals in this family might be at some increased risk of developing cancer, over the general population risk, due to the family history of cancer. We recommend women in this  family have a yearly mammogram beginning at age 33, or 57 years younger than the earliest onset of cancer, an annual clinical breast exam, and perform monthly breast self-exams.  Other members of the family may still carry a pathogenic variant in one of these genes that [name] did not inherit. Based on the family history, we recommend her siblings have genetic counseling and testing.   FOLLOW-UP:  Cancer genetics is a rapidly advancing field and it is possible that new genetic tests will be appropriate for her and/or her family members in the future. We encouraged her to remain in contact with cancer genetics on an annual basis so we can update her personal and family histories and let her know of advances in cancer genetics that may benefit this family.   Our contact number was provided. [name] questions were answered to her satisfaction, and she knows she is welcome to call us at anytime with additional questions or concerns.   Lalla Brothers, MS, [facility] Genetic Counselor [email or web address] (P) [phone]

## 2023-08-08 ENCOUNTER — Ambulatory Visit
Admission: RE | Admit: 2023-08-08 | Discharge: 2023-08-08 | Disposition: A | Discharge status: Home / Self Care | Payer: Medicare Other | Source: Ambulatory Visit | Attending: General Surgery | Admitting: General Surgery

## 2023-08-08 ENCOUNTER — Encounter (HOSPITAL_BASED_OUTPATIENT_CLINIC_OR_DEPARTMENT_OTHER)
Admission: RE | Admit: 2023-08-08 | Discharge: 2023-08-08 | Disposition: A | Discharge status: Home / Self Care | Payer: Medicare Other | Source: Ambulatory Visit | Attending: General Surgery

## 2023-08-08 ENCOUNTER — Inpatient Hospital Stay
Admission: RE | Admit: 2023-08-08 | Discharge: 2023-08-08 | Disposition: A | Discharge status: Home / Self Care | Payer: Medicare Other | Source: Ambulatory Visit | Attending: General Surgery | Admitting: General Surgery

## 2023-08-08 ENCOUNTER — Other Ambulatory Visit: Payer: Self-pay | Admitting: General Surgery

## 2023-08-08 DIAGNOSIS — Z01818 Encounter for other preprocedural examination: Secondary | ICD-10-CM | POA: Diagnosis present

## 2023-08-08 DIAGNOSIS — Z01810 Encounter for preprocedural cardiovascular examination: Secondary | ICD-10-CM | POA: Diagnosis not present

## 2023-08-08 DIAGNOSIS — I10 Essential (primary) hypertension: Secondary | ICD-10-CM | POA: Insufficient documentation

## 2023-08-08 DIAGNOSIS — Z170 Estrogen receptor positive status [ER+]: Secondary | ICD-10-CM

## 2023-08-08 DIAGNOSIS — R928 Other abnormal and inconclusive findings on diagnostic imaging of breast: Secondary | ICD-10-CM | POA: Diagnosis not present

## 2023-08-08 HISTORY — PX: BREAST BIOPSY: SHX20

## 2023-08-08 MED ORDER — CHLORHEXIDINE GLUCONATE CLOTH 2 % EX PADS: 6.0000 | MEDICATED_PAD | Freq: Once | CUTANEOUS | Status: DC

## 2023-08-08 NOTE — Progress Notes (Signed)

## 2023-08-11 ENCOUNTER — Ambulatory Visit
Admission: RE | Admit: 2023-08-11 | Discharge: 2023-08-11 | Disposition: A | Discharge status: Home / Self Care | Payer: Medicare Other | Source: Ambulatory Visit | Attending: General Surgery | Admitting: General Surgery

## 2023-08-11 ENCOUNTER — Other Ambulatory Visit: Payer: Self-pay

## 2023-08-11 ENCOUNTER — Ambulatory Visit (HOSPITAL_BASED_OUTPATIENT_CLINIC_OR_DEPARTMENT_OTHER)
Admission: RE | Admit: 2023-08-11 | Discharge: 2023-08-11 | Disposition: A | Discharge status: Home / Self Care | Payer: Medicare Other | Attending: General Surgery | Admitting: General Surgery

## 2023-08-11 ENCOUNTER — Encounter (HOSPITAL_BASED_OUTPATIENT_CLINIC_OR_DEPARTMENT_OTHER)
Admission: RE | Disposition: A | Discharge status: Home / Self Care | Payer: Self-pay | Source: Home / Self Care | Attending: General Surgery

## 2023-08-11 ENCOUNTER — Encounter (HOSPITAL_BASED_OUTPATIENT_CLINIC_OR_DEPARTMENT_OTHER): Payer: Self-pay | Admitting: General Surgery

## 2023-08-11 ENCOUNTER — Ambulatory Visit (HOSPITAL_BASED_OUTPATIENT_CLINIC_OR_DEPARTMENT_OTHER): Payer: Medicare Other | Admitting: Anesthesiology

## 2023-08-11 DIAGNOSIS — C50911 Malignant neoplasm of unspecified site of right female breast: Secondary | ICD-10-CM | POA: Diagnosis not present

## 2023-08-11 DIAGNOSIS — I739 Peripheral vascular disease, unspecified: Secondary | ICD-10-CM | POA: Insufficient documentation

## 2023-08-11 DIAGNOSIS — Z803 Family history of malignant neoplasm of breast: Secondary | ICD-10-CM | POA: Insufficient documentation

## 2023-08-11 DIAGNOSIS — F419 Anxiety disorder, unspecified: Secondary | ICD-10-CM | POA: Diagnosis not present

## 2023-08-11 DIAGNOSIS — R519 Headache, unspecified: Secondary | ICD-10-CM | POA: Insufficient documentation

## 2023-08-11 DIAGNOSIS — Z421 Encounter for breast reconstruction following mastectomy: Secondary | ICD-10-CM

## 2023-08-11 DIAGNOSIS — Z853 Personal history of malignant neoplasm of breast: Secondary | ICD-10-CM

## 2023-08-11 DIAGNOSIS — R928 Other abnormal and inconclusive findings on diagnostic imaging of breast: Secondary | ICD-10-CM | POA: Diagnosis not present

## 2023-08-11 DIAGNOSIS — Z79899 Other long term (current) drug therapy: Secondary | ICD-10-CM | POA: Insufficient documentation

## 2023-08-11 DIAGNOSIS — E7800 Pure hypercholesterolemia, unspecified: Secondary | ICD-10-CM | POA: Diagnosis not present

## 2023-08-11 DIAGNOSIS — N651 Disproportion of reconstructed breast: Secondary | ICD-10-CM | POA: Diagnosis not present

## 2023-08-11 DIAGNOSIS — I10 Essential (primary) hypertension: Secondary | ICD-10-CM | POA: Insufficient documentation

## 2023-08-11 DIAGNOSIS — Z170 Estrogen receptor positive status [ER+]: Secondary | ICD-10-CM | POA: Insufficient documentation

## 2023-08-11 DIAGNOSIS — Z7982 Long term (current) use of aspirin: Secondary | ICD-10-CM | POA: Diagnosis not present

## 2023-08-11 DIAGNOSIS — N6091 Unspecified benign mammary dysplasia of right breast: Secondary | ICD-10-CM | POA: Diagnosis not present

## 2023-08-11 DIAGNOSIS — Z1721 Progesterone receptor positive status: Secondary | ICD-10-CM | POA: Insufficient documentation

## 2023-08-11 DIAGNOSIS — C50211 Malignant neoplasm of upper-inner quadrant of right female breast: Secondary | ICD-10-CM | POA: Insufficient documentation

## 2023-08-11 DIAGNOSIS — K219 Gastro-esophageal reflux disease without esophagitis: Secondary | ICD-10-CM | POA: Insufficient documentation

## 2023-08-11 HISTORY — PX: PLACEMENT OF BREAST IMPLANTS: SHX6334

## 2023-08-11 HISTORY — PX: BREAST IMPLANT REMOVAL: SHX5361

## 2023-08-11 HISTORY — PX: BREAST LUMPECTOMY WITH RADIOACTIVE SEED LOCALIZATION: SHX6424

## 2023-08-11 HISTORY — PX: MASTOPEXY: SHX5358

## 2023-08-11 HISTORY — DX: Arterial fibromuscular dysplasia: I77.3

## 2023-08-11 SURGERY — BREAST LUMPECTOMY WITH RADIOACTIVE SEED LOCALIZATION
Anesthesia: General | Laterality: Right

## 2023-08-11 MED ORDER — BUPIVACAINE-EPINEPHRINE (PF) 0.25% -1:200000 IJ SOLN
INTRAMUSCULAR | Status: AC
  Filled 2023-08-11: qty 30

## 2023-08-11 MED ORDER — MIDAZOLAM HCL 2 MG/2ML IJ SOLN
INTRAMUSCULAR | Status: DC | PRN
  Administered 2023-08-11: 2 mg via INTRAVENOUS

## 2023-08-11 MED ORDER — PROPOFOL 10 MG/ML IV BOLUS
INTRAVENOUS | Status: DC | PRN
  Administered 2023-08-11: 120 ug via INTRAVENOUS

## 2023-08-11 MED ORDER — MIDAZOLAM HCL 2 MG/2ML IJ SOLN
INTRAMUSCULAR | Status: AC
  Filled 2023-08-11: qty 2

## 2023-08-11 MED ORDER — FENTANYL CITRATE (PF) 100 MCG/2ML IJ SOLN
INTRAMUSCULAR | Status: AC
  Filled 2023-08-11: qty 2

## 2023-08-11 MED ORDER — OXYCODONE HCL 5 MG PO TABS: 5.0000 mg | ORAL_TABLET | Freq: Four times a day (QID) | ORAL | 0 refills | Status: DC | PRN

## 2023-08-11 MED ORDER — GABAPENTIN 100 MG PO CAPS
ORAL_CAPSULE | ORAL | Status: AC
  Filled 2023-08-11: qty 1

## 2023-08-11 MED ORDER — LIDOCAINE-EPINEPHRINE 1 %-1:100000 IJ SOLN
INTRAMUSCULAR | Status: AC
  Filled 2023-08-11: qty 1

## 2023-08-11 MED ORDER — GABAPENTIN 100 MG PO CAPS
100.0000 mg | ORAL_CAPSULE | ORAL | Status: AC
  Administered 2023-08-11: 100 mg via ORAL

## 2023-08-11 MED ORDER — CEFAZOLIN SODIUM-DEXTROSE 2-4 GM/100ML-% IV SOLN
INTRAVENOUS | Status: AC
  Filled 2023-08-11: qty 100

## 2023-08-11 MED ORDER — VASHE WOUND IRRIGATION OPTIME
TOPICAL | Status: DC | PRN
  Administered 2023-08-11: 16 [oz_av]

## 2023-08-11 MED ORDER — LACTATED RINGERS IV SOLN: INTRAVENOUS | Status: DC

## 2023-08-11 MED ORDER — EPHEDRINE 5 MG/ML INJ
INTRAVENOUS | Status: AC
  Filled 2023-08-11: qty 10

## 2023-08-11 MED ORDER — ONDANSETRON HCL 4 MG/2ML IJ SOLN
INTRAMUSCULAR | Status: AC
  Filled 2023-08-11: qty 4

## 2023-08-11 MED ORDER — PROPOFOL 500 MG/50ML IV EMUL
INTRAVENOUS | Status: AC
  Filled 2023-08-11: qty 50

## 2023-08-11 MED ORDER — OXYCODONE HCL 5 MG/5ML PO SOLN: 5.0000 mg | Freq: Once | ORAL | Status: DC | PRN

## 2023-08-11 MED ORDER — ACETAMINOPHEN 500 MG PO TABS
ORAL_TABLET | ORAL | Status: AC
  Filled 2023-08-11: qty 2

## 2023-08-11 MED ORDER — PHENYLEPHRINE 80 MCG/ML (10ML) SYRINGE FOR IV PUSH (FOR BLOOD PRESSURE SUPPORT)
PREFILLED_SYRINGE | INTRAVENOUS | Status: AC
  Filled 2023-08-11: qty 10

## 2023-08-11 MED ORDER — LIDOCAINE 2% (20 MG/ML) 5 ML SYRINGE
INTRAMUSCULAR | Status: DC | PRN
  Administered 2023-08-11: 60 mg via INTRAVENOUS

## 2023-08-11 MED ORDER — HYDROMORPHONE HCL 1 MG/ML IJ SOLN: 0.2500 mg | INTRAMUSCULAR | Status: DC | PRN

## 2023-08-11 MED ORDER — PROPOFOL 10 MG/ML IV BOLUS
INTRAVENOUS | Status: AC
  Filled 2023-08-11: qty 20

## 2023-08-11 MED ORDER — CEFAZOLIN SODIUM-DEXTROSE 2-3 GM-%(50ML) IV SOLR
INTRAVENOUS | Status: DC | PRN
  Administered 2023-08-11: 2 g via INTRAVENOUS

## 2023-08-11 MED ORDER — CEFAZOLIN SODIUM-DEXTROSE 2-4 GM/100ML-% IV SOLN: 2.0000 g | INTRAVENOUS | Status: DC

## 2023-08-11 MED ORDER — BUPIVACAINE HCL (PF) 0.25 % IJ SOLN
INTRAMUSCULAR | Status: AC
  Filled 2023-08-11: qty 30

## 2023-08-11 MED ORDER — LIDOCAINE 2% (20 MG/ML) 5 ML SYRINGE
INTRAMUSCULAR | Status: AC
  Filled 2023-08-11: qty 15

## 2023-08-11 MED ORDER — PROPOFOL 500 MG/50ML IV EMUL
INTRAVENOUS | Status: DC | PRN
  Administered 2023-08-11: 125 ug/kg/min via INTRAVENOUS
  Administered 2023-08-11: 150 ug/kg/min via INTRAVENOUS

## 2023-08-11 MED ORDER — OXYCODONE HCL 5 MG PO TABS: 5.0000 mg | ORAL_TABLET | Freq: Once | ORAL | Status: DC | PRN

## 2023-08-11 MED ORDER — DEXAMETHASONE SODIUM PHOSPHATE 10 MG/ML IJ SOLN
INTRAMUSCULAR | Status: DC | PRN
  Administered 2023-08-11: 10 mg via INTRAVENOUS

## 2023-08-11 MED ORDER — ACETAMINOPHEN 500 MG PO TABS
1000.0000 mg | ORAL_TABLET | ORAL | Status: AC
  Administered 2023-08-11: 1000 mg via ORAL

## 2023-08-11 MED ORDER — ONDANSETRON HCL 4 MG/2ML IJ SOLN
INTRAMUSCULAR | Status: DC | PRN
  Administered 2023-08-11: 4 mg via INTRAVENOUS

## 2023-08-11 MED ORDER — FENTANYL CITRATE (PF) 100 MCG/2ML IJ SOLN
INTRAMUSCULAR | Status: DC | PRN
  Administered 2023-08-11 (×2): 50 ug via INTRAVENOUS

## 2023-08-11 MED ORDER — BUPIVACAINE-EPINEPHRINE (PF) 0.25% -1:200000 IJ SOLN
INTRAMUSCULAR | Status: DC | PRN
  Administered 2023-08-11: 22 mL

## 2023-08-11 MED ORDER — DROPERIDOL 2.5 MG/ML IJ SOLN
0.6250 mg | Freq: Once | INTRAMUSCULAR | Status: DC | PRN
Start: 2023-08-11 — End: 2023-08-11

## 2023-08-11 SURGICAL SUPPLY — 88 items
APPLIER CLIP 9.375 MED OPEN (MISCELLANEOUS) ×2
BAG DECANTER FOR FLEXI CONT (MISCELLANEOUS) ×2 IMPLANT
BINDER BREAST LRG (GAUZE/BANDAGES/DRESSINGS) IMPLANT
BINDER BREAST MEDIUM (GAUZE/BANDAGES/DRESSINGS) IMPLANT
BINDER BREAST XLRG (GAUZE/BANDAGES/DRESSINGS) IMPLANT
BINDER BREAST XXLRG (GAUZE/BANDAGES/DRESSINGS) IMPLANT
BIOPATCH RED 1 DISK 7.0 (GAUZE/BANDAGES/DRESSINGS) IMPLANT
BLADE HEX COATED 2.75 (ELECTRODE) ×2 IMPLANT
BLADE SURG 10 STRL SS (BLADE) ×2 IMPLANT
BLADE SURG 15 STRL LF DISP TIS (BLADE) ×2 IMPLANT
BNDG GAUZE DERMACEA FLUFF 4 (GAUZE/BANDAGES/DRESSINGS) ×4 IMPLANT
CANISTER SUC SOCK COL 7IN (MISCELLANEOUS) ×2 IMPLANT
CANISTER SUCT 1200ML W/VALVE (MISCELLANEOUS) ×2 IMPLANT
CHLORAPREP W/TINT 26 (MISCELLANEOUS) ×2 IMPLANT
CLIP APPLIE 9.375 MED OPEN (MISCELLANEOUS) IMPLANT
COVER BACK TABLE 60X90IN (DRAPES) ×2 IMPLANT
COVER MAYO STAND STRL (DRAPES) ×2 IMPLANT
COVER PROBE CYLINDRICAL 5X96 (MISCELLANEOUS) ×2 IMPLANT
DERMABOND ADVANCED .7 DNX12 (GAUZE/BANDAGES/DRESSINGS) ×2 IMPLANT
DRAIN CHANNEL 19F RND (DRAIN) IMPLANT
DRAPE LAPAROSCOPIC ABDOMINAL (DRAPES) ×2 IMPLANT
DRAPE UTILITY XL STRL (DRAPES) ×2 IMPLANT
DRSG MEPILEX POST OP 4X8 (GAUZE/BANDAGES/DRESSINGS) IMPLANT
DRSG TEGADERM 2-3/8X2-3/4 SM (GAUZE/BANDAGES/DRESSINGS) IMPLANT
ELECT BLADE 4.0 EZ CLEAN MEGAD (MISCELLANEOUS) ×2
ELECT BLADE 6.5 EXT (BLADE) IMPLANT
ELECT COATED BLADE 2.86 ST (ELECTRODE) ×2 IMPLANT
ELECT REM PT RETURN 9FT ADLT (ELECTROSURGICAL) ×4
ELECTRODE BLDE 4.0 EZ CLN MEGD (MISCELLANEOUS) ×2 IMPLANT
ELECTRODE REM PT RTRN 9FT ADLT (ELECTROSURGICAL) ×2 IMPLANT
EVACUATOR SILICONE 100CC (DRAIN) IMPLANT
FUNNEL KELLER 2 DISP (MISCELLANEOUS) IMPLANT
GAUZE PAD ABD 8X10 STRL (GAUZE/BANDAGES/DRESSINGS) ×4 IMPLANT
GAUZE SPONGE 4X4 12PLY STRL (GAUZE/BANDAGES/DRESSINGS) IMPLANT
GAUZE SPONGE 4X4 12PLY STRL LF (GAUZE/BANDAGES/DRESSINGS) IMPLANT
GLOVE BIO SURGEON STRL SZ 6.5 (GLOVE) ×4 IMPLANT
GLOVE BIO SURGEON STRL SZ7.5 (GLOVE) ×4 IMPLANT
GLOVE BIOGEL PI IND STRL 6.5 (GLOVE) IMPLANT
GLOVE SURG SS PI 6.5 STRL IVOR (GLOVE) IMPLANT
GOWN STRL REUS W/ TWL LRG LVL3 (GOWN DISPOSABLE) ×6 IMPLANT
IMPL GEL HP 200CC (Breast) IMPLANT
IMPLANT GEL HP 200CC (Breast) ×4 IMPLANT
IV NS 1000ML BAXH (IV SOLUTION) IMPLANT
IV NS 500ML BAXH (IV SOLUTION) ×2 IMPLANT
KIT FILL ASEPTIC TRANSFER (MISCELLANEOUS) IMPLANT
KIT MARKER MARGIN INK (KITS) ×2 IMPLANT
MARKER SKIN DUAL TIP RULER LAB (MISCELLANEOUS) IMPLANT
NDL HYPO 25X1 1.5 SAFETY (NEEDLE) ×2 IMPLANT
NDL SAFETY ECLIPSE 18X1.5 (NEEDLE) IMPLANT
NDL SPNL 18GX3.5 QUINCKE PK (NEEDLE) IMPLANT
NEEDLE HYPO 25X1 1.5 SAFETY (NEEDLE) ×2 IMPLANT
NEEDLE SPNL 18GX3.5 QUINCKE PK (NEEDLE) IMPLANT
NS IRRIG 1000ML POUR BTL (IV SOLUTION) ×2 IMPLANT
PACK BASIN DAY SURGERY FS (CUSTOM PROCEDURE TRAY) ×2 IMPLANT
PENCIL SMOKE EVACUATOR (MISCELLANEOUS) ×2 IMPLANT
PIN SAFETY STERILE (MISCELLANEOUS) IMPLANT
SIZER BREAST REUSE 200CC (SIZER) ×2
SIZER BRST REUSE 200CC (SIZER) IMPLANT
SLEEVE SCD COMPRESS KNEE MED (STOCKING) ×2 IMPLANT
SPIKE FLUID TRANSFER (MISCELLANEOUS) IMPLANT
SPONGE T-LAP 18X18 ~~LOC~~+RFID (SPONGE) ×4 IMPLANT
STRIP CLOSURE SKIN 1/2X4 (GAUZE/BANDAGES/DRESSINGS) ×2 IMPLANT
STRIP SUTURE WOUND CLOSURE 1/2 (MISCELLANEOUS) IMPLANT
SUT MNCRL AB 4-0 PS2 18 (SUTURE) IMPLANT
SUT MON AB 3-0 SH27 (SUTURE) ×2 IMPLANT
SUT MON AB 4-0 PC3 18 (SUTURE) ×2 IMPLANT
SUT MON AB 5-0 PS2 18 (SUTURE) ×2 IMPLANT
SUT PDS 3-0 CT2 (SUTURE) ×8
SUT PDS AB 2-0 CT2 27 (SUTURE) IMPLANT
SUT PDS II 3-0 CT2 27 ABS (SUTURE) IMPLANT
SUT PROLENE 3 0 PS 2 (SUTURE) IMPLANT
SUT SILK 2 0 SH (SUTURE) IMPLANT
SUT SILK 3 0 PS 1 (SUTURE) IMPLANT
SUT VIC AB 3-0 SH 27X BRD (SUTURE) IMPLANT
SUT VIC AB 4-0 PS2 18 (SUTURE) IMPLANT
SUT VICRYL 3-0 CR8 SH (SUTURE) ×2 IMPLANT
SWAB COLLECTION DEVICE MRSA (MISCELLANEOUS) IMPLANT
SWAB CULTURE ESWAB REG 1ML (MISCELLANEOUS) IMPLANT
SYR 50ML LL SCALE MARK (SYRINGE) IMPLANT
SYR BULB IRRIG 60ML STRL (SYRINGE) ×2 IMPLANT
SYR CONTROL 10ML LL (SYRINGE) ×2 IMPLANT
TAPE MEASURE VINYL STERILE (MISCELLANEOUS) ×2 IMPLANT
TOWEL GREEN STERILE FF (TOWEL DISPOSABLE) ×4 IMPLANT
TRAY DSU PREP LF (CUSTOM PROCEDURE TRAY) ×2 IMPLANT
TRAY FAXITRON CT DISP (TRAY / TRAY PROCEDURE) ×2 IMPLANT
TUBE CONNECTING 20X1/4 (TUBING) ×2 IMPLANT
UNDERPAD 30X36 HEAVY ABSORB (UNDERPADS AND DIAPERS) ×4 IMPLANT
YANKAUER SUCT BULB TIP NO VENT (SUCTIONS) ×2 IMPLANT

## 2023-08-11 NOTE — Interval H&P Note (Signed)
History and Physical Interval Note:  08/11/2023 11:34 AM  [name] [name]  has presented today for surgery, with the diagnosis of RIGHT BREAST CANCER.  The various methods of treatment have been discussed with the patient and family. After consideration of risks, benefits and other options for treatment, the patient has consented to  Procedure(s): RIGHT BREAST RADIOACTIVE SEED LOCALIZED LUMPECTOMY x2 (Right) BILATERAL REMOVAL BREAST IMPLANTS (Bilateral) MASTOPEXY (Bilateral) PLACEMENT OF SILCONE IMPLANTS (Bilateral) as a surgical intervention.  The patient's history has been reviewed, patient examined, no change in status, stable for surgery.  I have reviewed the patient's chart and labs.  Questions were answered to the patient's satisfaction.     Alena Bills Jaiveer Panas

## 2023-08-11 NOTE — Anesthesia Procedure Notes (Signed)
Procedure Name: LMA Insertion Date/Time: 08/11/2023 11:53 AM  Performed by: Alvera Novel, CRNAPre-anesthesia Checklist: Patient identified, Emergency Drugs available, Suction available and Patient being monitored Patient Re-evaluated:Patient Re-evaluated prior to induction Oxygen Delivery Method: Circle System Utilized Preoxygenation: Pre-oxygenation with 100% oxygen Induction Type: IV induction Ventilation: Mask ventilation without difficulty LMA: LMA inserted LMA Size: 4.0 Number of attempts: 1 Placement Confirmation: positive ETCO2 Tube secured with: Tape Dental Injury: Teeth and Oropharynx as per pre-operative assessment

## 2023-08-11 NOTE — Discharge Instructions (Addendum)
INSTRUCTIONS FOR AFTER BREAST SURGERY   You will likely have some questions about what to expect following your operation.  The following information will help you and your family understand what to expect when you are discharged from the hospital.  It is important to follow these guidelines to help ensure a smooth recovery and reduce complication.  Postoperative instructions include information on: diet, wound care, medications and physical activity.  AFTER SURGERY Expect to go home after the procedure.  In some cases, you may need to spend one night in the hospital for observation.  DIET Breast surgery does not require a specific diet.  However, the healthier you eat the better your body will heal. It is important to increasing your protein intake.  This means limiting the foods with sugar and carbohydrates.  Focus on vegetables and some meat.  If you have liposuction during your procedure be sure to drink water.  If your urine is bright yellow, then it is concentrated, and you need to drink more water.  As a general rule after surgery, you should have 8 ounces of water every hour while awake.  If you find you are persistently nauseated or unable to take in liquids let us know.  NO TOBACCO USE or EXPOSURE.  This will slow your healing process and lead to a wound.  WOUND CARE Leave the binder on for 3 days . Use fragrance free soap like Dial, Dove or [place].   After 3 days you can remove the binder to shower. Once dry apply binder or sports bra. If you have liposuction you will have a soft and spongy dressing (Lipofoam) that helps prevent creases in your skin.  Remove before you shower and then replace it.  It is also available on [employer]. If you have steri-strips / tape directly attached to your skin leave them in place. It is OK to get these wet.   No baths, pools or hot tubs for four weeks. We close your incision to leave the smallest and best-looking scar. No ointment or creams on your incisions  for four weeks.  No Neosporin (Too many skin reactions).  A few weeks after surgery you can use Mederma and start massaging the scar. We ask you to wear your binder or sports bra for the first 6 weeks around the clock, including while sleeping. This provides added comfort and helps reduce the fluid accumulation at the surgery site. NO Ice or heating pads to the operative site.  You have a very high risk of a BURN before you feel the temperature change.  ACTIVITY No heavy lifting until cleared by the doctor.  This usually means no more than a half-gallon of milk.  It is OK to walk and climb stairs. Moving your legs is very important to decrease your risk of a blood clot.  It will also help keep you from getting deconditioned.  Every 1 to 2 hours get up and walk for 5 minutes. This will help with a quicker recovery back to normal.  Let pain be your guide so you don't do too much.  This time is for you to recover.  You will be more comfortable if you sleep and rest with your head elevated either with a few pillows under you or in a recliner.  No stomach sleeping for a three months.  WORK Everyone returns to work at different times. As a rough guide, most people take at least 1 - 2 weeks off prior to returning to work. If  you need documentation for your job, give the forms to the front staff at the clinic.  DRIVING Arrange for someone to bring you home from the hospital after your surgery.  You may be able to drive a few days after surgery but not while taking any narcotics or valium.  BOWEL MOVEMENTS Constipation can occur after anesthesia and while taking pain medication.  It is important to stay ahead for your comfort.  We recommend taking Milk of Magnesia (2 tablespoons; twice a day) while taking the pain pills.  MEDICATIONS You may be prescribed should start after surgery At your preoperative visit for you history and physical you may have been given the following medications: An antibiotic: Start  this medication when you get home and take according to the instructions on the bottle. Zofran 4 mg:  This is to treat nausea and vomiting.  You can take this every 6 hours as needed and only if needed. Valium 2 mg for breast cancer patients: This is for muscle tightness if you have an implant or expander. This will help relax your muscle which also helps with pain control.  This can be taken every 12 hours as needed. Don't drive after taking this medication. Norco (hydrocodone/acetaminophen) 5/325 mg:  This is only to be used after you have taken the Motrin or the Tylenol. Every 8 hours as needed.   Over the counter Medication to take: Ibuprofen (Motrin) 600 mg:  Take this every 6 hours.  If you have additional pain then take 500 mg of the Tylenol every 8 hours.  Only take the Norco after you have tried these two. MiraLAX or Milk of Magnesia: Take this according to the bottle if you take the Norco.  WHEN TO CALL Call your surgeon's office if any of the following occur: Fever 101 degrees F or greater Excessive bleeding or fluid from the incision site. Pain that increases over time without aid from the medications Redness, warmth, or pus draining from incision sites Persistent nausea or inability to take in liquids Severe misshapen area that underwent the operation.  Next dose of tylenol if needed after 4:30pm    Post Anesthesia Home Care Instructions  Activity: Get plenty of rest for the remainder of the day. A responsible individual must stay with you for 24 hours following the procedure.  For the next 24 hours, DO NOT: -Drive a car -Advertising copywriter -Drink alcoholic beverages -Take any medication unless instructed by your physician -Make any legal decisions or sign important papers.  Meals: Start with liquid foods such as gelatin or soup. Progress to regular foods as tolerated. Avoid greasy, spicy, heavy foods. If nausea and/or vomiting occur, drink only clear liquids until the  nausea and/or vomiting subsides. Call your physician if vomiting continues.  Special Instructions/Symptoms: Your throat may feel dry or sore from the anesthesia or the breathing tube placed in your throat during surgery. If this causes discomfort, gargle with warm salt water. The discomfort should disappear within 24 hours.  If you had a scopolamine patch placed behind your ear for the management of post- operative nausea and/or vomiting:  1. The medication in the patch is effective for 72 hours, after which it should be removed.  Wrap patch in a tissue and discard in the trash. Wash hands thoroughly with soap and water. 2. You may remove the patch earlier than 72 hours if you experience unpleasant side effects which may include dry mouth, dizziness or visual disturbances. 3. Avoid touching the patch. Wash  your hands with soap and water after contact with the patch.

## 2023-08-11 NOTE — Op Note (Signed)
Op report Bilateral Exchange   DATE OF OPERATION: 08/11/2023  LOCATION: [facility]  SURGICAL DIVISION: Plastic Surgery  PREOPERATIVE DIAGNOSIS:  Right breast cancer.  Breast Asymmetry.   POSTOPERATIVE DIAGNOSIS:  Same as preoperative diagnosis   PROCEDURE:  1. Bilateral removal of breast implants.  2. Bilateral mastopexy for symmetry  3. Bilateral placement of silicone implants 4. Bilateral circumferential capsulotomies.  SURGEON: Foster Simpson, DO.  ASSISTANT: Keenan Bachelor, PA  ANESTHESIA:  General.   COMPLICATIONS: None.   IMPLANTS: Left - Mentor Smooth Round High Profile Gel 200 cc. Ref #[id number].  Serial Number [id number] Right - Mentor Smooth Round High Profile Gel 200 cc. Ref #[id number].  Serial Number [id number]  INDICATIONS FOR PROCEDURE:  The patient, [name] [name], is a 72 y.o. female born on February 24, 1951, is here for treatment of breast cancer with a right partial mastectomy.  She had implants many years ago and it is time to remove them and place new ones for symmetry after the partial mastectomy. MRN: [id number]  CONSENT:  Informed consent was obtained directly from the patient. Risks, benefits and alternatives were fully discussed. Specific risks including but not limited to bleeding, infection, hematoma, seroma, scarring, pain, implant infection, implant extrusion, capsular contracture, asymmetry, wound healing problems, and need for further surgery were all discussed. The patient did have an ample opportunity to have her questions answered to her satisfaction.   DESCRIPTION OF PROCEDURE:  The patient was taken to the operating room with general surgery for treatment of breast cancer. SCDs were placed and IV antibiotics were given. The patient's chest was prepped and draped in a sterile fashion. A time out was performed and the implants to be used were identified.    On the right breast: Local with epinephrine was used to  infiltrate at the incision site. A periareolar mark was made and then a periareolar mark superiorly for the lift. Local was injected for intraoperative hemostasis and postoperative pain control. The periareolar skin was de-epithelialized.  The vertical limb incision was made.  The bovie was used to dissect to the capsule and then to the implant.  The implant was removed in total.  It was a saline 220 cc implant. Circumferential capsulotomies were performed to allow for breast pocket expansion and better form.  Measurements were made and a sizer used to confirm adequate pocket size for the implant dimensions.  Hemostasis was ensured with electrocautery. New gloves were placed. The lumpectomy incision was closed with the 3-0 Vicryl to decrease the space deficit.  The skin was closed with the 4-0 Monocryl. The implant was soaked in vashe and then placed in the pocket and oriented appropriately using the keller funnel. The capsule was closed with a 3-0 PDS suture. The remaining tissue was closed with 3-0 PDS.  The skin was closed with 3-0 Monocryl subcuticular stitches.   On the left breast: Local with epinephrine was used to infiltrate at the incision site. A periareolar mark was made and then a periareolar mark superiorly for the lift. Local was injected for intraoperative hemostasis and postoperative pain control. The periareolar skin was de-epithelialized.  The vertical limb incision was made.  The bovie was used to dissect to the capsule and then to the implant.  The implant was removed in total.  It was a saline 220 cc implant. Circumferential capsulotomies were performed to allow for breast pocket expansion and better form.  Measurements were made and a sizer used to confirm adequate pocket size for the implant  dimensions.  Hemostasis was ensured with electrocautery. New gloves were placed. The implant was soaked in vashe and then placed in the pocket and oriented appropriately using the keller funnel. The  capsule was closed with a 3-0 PDS suture. The remaining tissue was closed with 3-0 PDS.  The skin was closed with 3-0 Monocryl subcuticular stitches.  Dermabond was applied to the incision site. A breast binder and ABDs were placed.  The patient was allowed to wake from anesthesia and taken to the recovery room in satisfactory condition.   The advanced practice practitioner (APP) assisted throughout the case.  The APP was essential in retraction and counter traction when needed to make the case progress smoothly.  This retraction and assistance made it possible to see the tissue plans for the procedure.  The assistance was needed for blood control, tissue re-approximation and assisted with closure of the incision site.

## 2023-08-11 NOTE — Op Note (Signed)
08/11/2023  1:07 PM  PATIENT:  [name] [name]  72 y.o. female  PRE-OPERATIVE DIAGNOSIS:  RIGHT BREAST CANCER  POST-OPERATIVE DIAGNOSIS:  RIGHT BREAST CANCER  PROCEDURE:  Procedure(s): RIGHT BREAST RADIOACTIVE SEED LOCALIZED LUMPECTOMY x2 (Right)  SURGEON:  Surgeons and Role: Panel 1:    Griselda Miner, MD - Primary  PHYSICIAN ASSISTANT:   ASSISTANTS: none   ANESTHESIA:   local and general  EBL:  25 mL   BLOOD ADMINISTERED:none  DRAINS: none   LOCAL MEDICATIONS USED:  MARCAINE     SPECIMEN:  Source of Specimen:  right breast tissue with additional inferomedial margin  DISPOSITION OF SPECIMEN:  PATHOLOGY  COUNTS:  YES  TOURNIQUET:  * No tourniquets in log *  DICTATION: .Dragon Dictation  After informed consent was obtained the patient was brought to the operating room and placed in the supine position on the operating room table.  After adequate induction of general anesthesia the patient's bilateral chest, breast, and axillary areas were prepped with ChloraPrep, allowed to dry, and draped in usual sterile manner.  An appropriate timeout was performed.  Previously 2 I-125 seeds were placed in the upper inner quadrant of the right breast to mark areas of invasive breast cancer.  The neoprobe was set to I-125 in the area of radioactivity was readily identified.  Dr. Ulice Bold marked out in the area just above the areola of the right breast for her portion of the procedure.  This area was infiltrated with quarter percent Marcaine.  I made a curvilinear incision along the marked out line with a 15 blade knife.  The incision was carried through the skin and subcutaneous tissue sharply with the electrocautery.  Dissection was then carried out superiorly between the breast tissue and the subcutaneous fat and skin.  Once this dissection was well beyond the area of the cancer I then removed a oblong piece of breast tissue sharply with the electrocautery around the 2 radioactive seeds  while checking the area of radioactivity frequently.  This dissection was carried all the way to the muscle of the chest wall.  The capsule of the implant was below the muscle.  Once the tissue was removed it was oriented with the appropriate paint colors.  A specimen radiograph was obtained that showed the 2 clips and 2 seeds to be within the specimen.  I elected to take an additional inferior medial margin and this was marked appropriately.  All of the tissue was then sent to pathology for further evaluation.  Hemostasis was achieved using the Bovie electrocautery.  At this point the operation was turned over to Dr. Ulice Bold.  The patient was tolerating the procedure well.  All needle sponge and instrument counts were correct.  Her portion of the operation will be dictated separately.  PLAN OF CARE: Discharge to home after PACU  PATIENT DISPOSITION:  PACU - hemodynamically stable.   Delay start of Pharmacological VTE agent (>24hrs) due to surgical blood loss or risk of bleeding: not applicable

## 2023-08-11 NOTE — Transfer of Care (Signed)
Immediate Anesthesia Transfer of Care Note  Patient: [name] [name]  Procedure(s) Performed: RIGHT BREAST RADIOACTIVE SEED LOCALIZED LUMPECTOMY x2 (Right) BILATERAL REMOVAL BREAST IMPLANTS (Bilateral) MASTOPEXY (Bilateral) PLACEMENT OF SILCONE IMPLANTS (Bilateral)  Patient Location: PACU  Anesthesia Type:General  Level of Consciousness: drowsy  Airway & Oxygen Therapy: Patient Spontanous Breathing and Patient connected to face mask oxygen  Post-op Assessment: Report given to RN and Post -op Vital signs reviewed and stable  Post vital signs: Reviewed and stable  Last Vitals:  Vitals Value Taken Time  BP 139/71 08/11/23 1330  Temp    Pulse 72 08/11/23 1332  Resp 21 08/11/23 1332  SpO2 100 % 08/11/23 1332  Vitals shown include unfiled device data.  Last Pain:  Vitals:   08/11/23 1023  TempSrc: Tympanic  PainSc: 0-No pain      Patients Stated Pain Goal: 7 (08/11/23 1023)  Complications: No notable events documented.

## 2023-08-11 NOTE — Anesthesia Preprocedure Evaluation (Addendum)
Anesthesia Evaluation  Patient identified by MRN, date of birth, ID band Patient awake    Reviewed: Allergy & Precautions, NPO status , Patient's Chart, lab work & pertinent test results  Airway Mallampati: III  TM Distance: >3 FB Neck ROM: Full    Dental no notable dental hx. (+) Dental Advisory Given, Teeth Intact   Pulmonary neg pulmonary ROS   Pulmonary exam normal breath sounds clear to auscultation       Cardiovascular hypertension, Pt. on medications and Pt. 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    GI/Hepatic Neg liver ROS,GERD  ,,  Endo/Other  negative endocrine ROS    Renal/GU negative Renal ROS     Musculoskeletal negative musculoskeletal ROS (+)    Abdominal   Peds  Hematology negative hematology ROS (+)   Anesthesia Other Findings   Reproductive/Obstetrics                             Anesthesia Physical Anesthesia Plan  ASA: 3  Anesthesia Plan: General   Post-op Pain Management: Tylenol PO (pre-op)* and Gabapentin PO (pre-op)*   Induction: Intravenous  PONV Risk Score and Plan: 4 or greater and Propofol infusion, Treatment may vary due to age or medical condition, TIVA, Ondansetron and Dexamethasone  Airway Management Planned: LMA  Additional Equipment: None  Intra-op Plan:   Post-operative Plan: Extubation in OR  Informed Consent: I have reviewed the patients History and Physical, chart, labs and discussed the procedure including the risks, benefits and alternatives for the proposed anesthesia with the patient or authorized representative who has indicated his/her understanding and acceptance.      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 I reviewed this history with the patient and she is adamant she doesn't have any form of muscular dystrophy. I went through notes and this diagnosis appears in her past medical history all the way back to 02/2016 in the pulmonology note Clent Ridges, NP), but isn't present in [place]'s note 01/2016. 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

## 2023-08-11 NOTE — Interval H&P Note (Signed)
History and Physical Interval Note:  08/11/2023 10:52 AM  [name] [name]  has presented today for surgery, with the diagnosis of RIGHT BREAST CANCER.  The various methods of treatment have been discussed with the patient and family. After consideration of risks, benefits and other options for treatment, the patient has consented to  Procedure(s): RIGHT BREAST RADIOACTIVE SEED LOCALIZED LUMPECTOMY x2 (Right) BILATERAL REMOVAL BREAST IMPLANTS (Bilateral) MASTOPEXY (Bilateral) PLACEMENT OF SILCONE IMPLANTS (Bilateral) as a surgical intervention.  The patient's history has been reviewed, patient examined, no change in status, stable for surgery.  I have reviewed the patient's chart and labs.  Questions were answered to the patient's satisfaction.     Chevis Pretty III

## 2023-08-11 NOTE — H&P (Signed)
REFERRING PHYSICIAN: Lucia Bitter* PROVIDER: Lindell Noe, MD MRN: [id number] DOB: Sep 23, 1950 Subjective   Chief Complaint: Breast Cancer  History of Present Illness: [name] [name] is a 72 y.o. female who is seen today as an office consultation for evaluation of Breast Cancer  We are asked to see the patient in consultation by Dr. Al Pimple to evaluate her for a new right breast cancer. The patient is a 72 year old black female who recently felt a mass in the upper inner quadrant of the right breast about a month ago. She had a mammogram back in June that was unremarkable. On her current mammogram and ultrasound she was found to have 2 small masses in the upper inner quadrant of the right breast measuring 1.6 cm and 0.7 cm. The axilla looked normal. 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 She is otherwise in good health except for some hypertension and does not smoke. She does have a family history of breast cancer in her mother as well as pancreatic cancer.  Review of Systems: A complete review of systems was obtained from the patient. I have reviewed this information and discussed as appropriate with the patient. See HPI as well for other ROS.  ROS   Medical History: Past Medical History:  Diagnosis Date  History of cancer  Hypertension   Patient Active Problem List  Diagnosis  Essential hypertension  Malignant neoplasm of overlapping sites of right breast in female, estrogen receptor positive (CMS/HHS-HCC)  Mass of right breast  Malignant neoplasm of upper-inner quadrant of right breast in female, estrogen receptor positive (CMS/HHS-HCC)   Past Surgical History:  Procedure Laterality Date  COLONOSCOPY N/A 03/20/2023  Right Breast Biopsy Right 07/03/2023    Allergies  Allergen Reactions  Trazodone Dizziness  Dizziness even at low doses   Current Outpatient  Medications on File Prior to Visit  Medication Sig Dispense Refill  rosuvastatin (CRESTOR) 10 MG tablet Take 1 tablet by mouth once daily  amLODIPine (NORVASC) 5 MG tablet Take 5 mg by mouth once daily  aspirin 81 MG EC tablet Take 81 mg by mouth once daily  estradioL (ESTRACE) 0.5 MG tablet Take 0.5 mg by mouth once daily  nebivoloL (BYSTOLIC) 10 MG tablet Take 10 mg by mouth once daily   No current facility-administered medications on file prior to visit.   Family History  Problem Relation Age of Onset  Diabetes Mother  Deep vein thrombosis (DVT or abnormal blood clot formation) Mother  Breast cancer Mother  Coronary Artery Disease (Blocked arteries around heart) Father    Social History   Tobacco Use  Smoking Status Never  Smokeless Tobacco Never    Social History   Socioeconomic History  Marital status: Married  Tobacco Use  Smoking status: Never  Smokeless tobacco: Never   Social Drivers of Corporate investment banker Strain: Low Risk (08/19/2022)  Received from [facility]  Overall Financial Resource Strain (CARDIA)  Difficulty of Paying Living Expenses: Not hard at all  Food Insecurity: No Food Insecurity (08/19/2022)  Received from [facility]  Hunger Vital Sign  Worried About Running Out of Food in the Last Year: Never true  Ran Out of Food in the Last Year: Never true  Transportation Needs: No Transportation Needs (08/19/2022)  Received from [facility] - Transportation  Lack of Transportation (Medical): No  Lack of Transportation (Non-Medical): No  Physical Activity: Inactive (08/19/2022)  Received from [facility]  Exercise Vital Sign  Days of Exercise per Week: 0 days  Minutes of Exercise per Session: 0 min  Stress: No Stress Concern Present (08/19/2022)  Received from [facility] - Occupational Stress Questionnaire  Feeling of Stress : Not at all  Social Connections: Socially Isolated (08/19/2022)   Received from [facility]  Social Connection and Isolation Panel [NHANES]  Frequency of Communication with Friends and Family: More than three times a week  Frequency of Social Gatherings with Friends and Family: Once a week  Attends Religious Services: Never  Database administrator or Organizations: No  Attends Banker Meetings: Never  Marital Status: Divorced   Objective:  There were no vitals filed for this visit.  There is no height or weight on file to calculate BMI.  Physical Exam Vitals reviewed.  Constitutional:  General: She is not in acute distress. Appearance: Normal appearance.  HENT:  Head: Normocephalic and atraumatic.  Right Ear: External ear normal.  Left Ear: External ear normal.  Nose: Nose normal.  Mouth/Throat:  Mouth: Mucous membranes are moist.  Pharynx: Oropharynx is clear.  Eyes:  General: No scleral icterus. Extraocular Movements: Extraocular movements intact.  Conjunctiva/sclera: Conjunctivae normal.  Pupils: Pupils are equal, round, and reactive to light.  Cardiovascular:  Rate and Rhythm: Normal rate and regular rhythm.  Pulses: Normal pulses.  Heart sounds: Normal heart sounds.  Pulmonary:  Effort: Pulmonary effort is normal. No respiratory distress.  Breath sounds: Normal breath sounds.  Abdominal:  General: Bowel sounds are normal.  Palpations: Abdomen is soft.  Tenderness: There is no abdominal tenderness.  Musculoskeletal:  General: No swelling, tenderness or deformity. Normal range of motion.  Cervical back: Normal range of motion and neck supple.  Skin: General: Skin is warm and dry.  Coloration: Skin is not jaundiced.  Neurological:  General: No focal deficit present.  Mental Status: She is alert and oriented to person, place, and time.  Psychiatric:  Mood and Affect: Mood normal.  Behavior: Behavior normal.     Breast: There is a 1 cm palpable area of firmness in the upper inner quadrant of the right breast.  Other than this there is no other palpable mass in either breast. There is no palpable axillary, supraclavicular, or cervical lymphadenopathy.  Labs, Imaging and Diagnostic Testing:  Assessment and Plan:   Diagnoses and all orders for this visit:  Malignant neoplasm of upper-inner quadrant of right breast in female, estrogen receptor positive (CMS/HHS-HCC)   The patient appears to have 2 small masses in the upper inner quadrant of the right breast with clinically negative nodes and all favorable markers. At this point given the fact that there are 2 masses present and were not seen back in June I would favor evaluating the breast further with MRI to make sure we have a accurate assessment of the size of the area. We will also need to look at the left breast to make sure there is nothing there as well. If all looks similar then she is favoring breast conservation which I feel is very reasonable. She would not need a node evaluation. I have discussed with her in detail the risks and benefits of the operation as well as some of the technical aspects including use of radioactive seeds for localization and she understands and wishes to proceed. We will call her with the results of the MRI and see if this  changes our plan at all. We will go ahead and start surgical scheduling. She will also meet with medical and radiation oncology to discuss adjuvant therapy.

## 2023-08-11 NOTE — Anesthesia Postprocedure Evaluation (Signed)
Anesthesia Post Note  Patient: [name] [name]  Procedure(s) Performed: RIGHT BREAST RADIOACTIVE SEED LOCALIZED LUMPECTOMY x2 (Right) BILATERAL REMOVAL BREAST IMPLANTS (Bilateral) MASTOPEXY (Bilateral) PLACEMENT OF SILCONE IMPLANTS (Bilateral)     Patient location during evaluation: PACU Anesthesia Type: General Level of consciousness: sedated and patient cooperative Pain management: pain level controlled Vital Signs Assessment: post-procedure vital signs reviewed and stable Respiratory status: spontaneous breathing Cardiovascular status: stable Anesthetic complications: no   No notable events documented.  Last Vitals:  Vitals:   08/11/23 1346 08/11/23 1400  BP: (!) 150/89 135/71  Pulse: 71 74  Resp: 14 16  Temp:    SpO2: 100% 97%    Last Pain:  Vitals:   08/11/23 1403  TempSrc:   PainSc: 0-No pain                 Lewie Loron

## 2023-08-14 ENCOUNTER — Encounter (HOSPITAL_BASED_OUTPATIENT_CLINIC_OR_DEPARTMENT_OTHER): Payer: Self-pay | Admitting: General Surgery

## 2023-08-14 LAB — SURGICAL PATHOLOGY

## 2023-08-15 ENCOUNTER — Encounter: Payer: Self-pay | Admitting: *Deleted

## 2023-08-15 ENCOUNTER — Telehealth: Payer: Self-pay | Admitting: *Deleted

## 2023-08-15 NOTE — Telephone Encounter (Signed)
Received order oncotype testing. Requisition sent to pathology

## 2023-08-19 ENCOUNTER — Other Ambulatory Visit: Payer: Self-pay | Admitting: Physician Assistant

## 2023-08-21 ENCOUNTER — Ambulatory Visit (INDEPENDENT_AMBULATORY_CARE_PROVIDER_SITE_OTHER): Payer: Medicare Other | Admitting: Surgical

## 2023-08-21 VITALS — BP 133/74 | HR 74

## 2023-08-21 DIAGNOSIS — Z9889 Other specified postprocedural states: Secondary | ICD-10-CM

## 2023-08-21 DIAGNOSIS — Z170 Estrogen receptor positive status [ER+]: Secondary | ICD-10-CM

## 2023-08-21 NOTE — Progress Notes (Signed)
Patient is a 72 year old female here for follow-up after Removal of bilateral breast implants, bilateral mastopexy and placement of bilateral silicone implants with Dr. Ulice Bold in conjunction with right breast radioactive lumpectomy x 2 with Dr. Carolynne Edouard.  Her surgery was on 08/11/2023.  She is 10 days postop.  Implant: Left-Mentor smooth round high-profile gel 200 cc, right-Mentor smooth round high-profile gel 200 cc  She reports overall she is doing really well.  She is not having any pain or infectious symptoms today.  She does have questions about the incisions around her nipple areola.  Chaperone present on exam On exam bilateral NAC's are viable, bilateral breast incisions appear intact.  Steri-Strips and Mepilex border dressings are in place.  Mepilex border is not soiled.  This was removed.  Steri-Strips in place, she does have some mild drainage on the Steri-Strips but no signs of any incisional dehiscence.  She does have some resolving ecchymosis noted around the right periareolar area.  A/P:  Patient is overall doing well after removal of breast implants, bilateral mastopexy and placement of bilateral silicone breast implants with Dr. Ulice Bold 10 days ago.  There is no signs of infection or concern on exam today.  Recommend continue to avoid strenuous activities or heavy lifting.  Continue with compressive garments.  She is scheduled for follow-up with Dr. Ulice Bold on 09/01/2023.  Pictures were obtained of the patient and placed in the chart with the patient's or guardian's permission.

## 2023-08-24 ENCOUNTER — Ambulatory Visit (INDEPENDENT_AMBULATORY_CARE_PROVIDER_SITE_OTHER): Payer: Medicare Other

## 2023-08-24 DIAGNOSIS — Z23 Encounter for immunization: Secondary | ICD-10-CM | POA: Diagnosis not present

## 2023-08-24 NOTE — Progress Notes (Signed)
Patient is in office today for a nurse visit for Testosterone Injection and Flu vaccine , per PCP's order. Patient Injection was given in the  Right deltoid. Patient tolerated injection well.

## 2023-08-28 ENCOUNTER — Encounter (HOSPITAL_COMMUNITY): Payer: Self-pay

## 2023-08-29 ENCOUNTER — Ambulatory Visit (INDEPENDENT_AMBULATORY_CARE_PROVIDER_SITE_OTHER): Payer: Medicare Other

## 2023-08-29 ENCOUNTER — Encounter: Payer: Self-pay | Admitting: *Deleted

## 2023-08-29 VITALS — Wt 140.0 lb

## 2023-08-29 DIAGNOSIS — Z0000 Encounter for general adult medical examination without abnormal findings: Secondary | ICD-10-CM | POA: Diagnosis not present

## 2023-08-29 NOTE — Patient Instructions (Signed)
Ms. Erb , Thank you for taking time to come for your Medicare Wellness Visit. I appreciate your ongoing commitment to your health goals. Please review the following plan we discussed and let me know if I can assist you in the future.   Referrals/Orders/Follow-Ups/Clinician Recommendations: maintain health and activity   This is a list of the screening recommended for you and due dates:  Health Maintenance  Topic Date Due   DTaP/Tdap/Td vaccine (2 - Tdap) 07/14/2020   COVID-19 Vaccine (9 - 2023-24 season) 08/09/2023   Medicare Annual Wellness Visit  08/20/2023   Mammogram  07/15/2024   DEXA scan (bone density measurement)  01/05/2025   Colon Cancer Screening  03/19/2030   Pneumonia Vaccine  Completed   Flu Shot  Completed   Hepatitis C Screening  Completed   Zoster (Shingles) Vaccine  Completed   HPV Vaccine  Aged Out    Advanced directives: (Copy Requested) Please bring a copy of your health care power of attorney and living will to the office to be added to your chart at your convenience.  Next Medicare Annual Wellness Visit scheduled for next year: Yes

## 2023-08-29 NOTE — Progress Notes (Signed)
Subjective:   [name] [name] is a 72 y.o. female who presents for Medicare Annual (Subsequent) preventive examination.  Visit Complete: Virtual I connected with  Dorene Grebe on 08/29/23 by a audio enabled telemedicine application and verified that I am speaking with the correct person using two identifiers.  Patient Location: Home  Provider Location: Office/Clinic  I discussed the limitations of evaluation and management by telemedicine. The patient expressed understanding and agreed to proceed.  Vital Signs: Because this visit was a virtual/telehealth visit, some criteria may be missing or patient reported. Any vitals not documented were not able to be obtained and vitals that have been documented are patient reported.   Cardiac Risk Factors include: advanced age (>22men, >83 women);dyslipidemia;hypertension     Objective:    Today's Vitals   08/29/23 1532  Weight: 140 lb (63.5 kg)   Body mass index is 25.61 kg/m.     08/29/2023    3:38 PM 08/11/2023   10:18 AM 08/04/2023   10:42 AM 08/19/2022    2:36 PM 08/06/2021    2:48 PM 12/16/2019    3:00 PM 02/22/2016   10:46 AM  Advanced Directives  Does Patient Have a Medical Advance Directive? Yes No Yes Yes Yes Yes No  Type of Estate agent of [place];Living will  Healthcare Power of [employer] of [place];Living will Healthcare Power of Attorney Living will;Healthcare Power of Attorney   Does patient want to make changes to medical advance directive?  No - Guardian declined No - Patient declined   No - Patient declined   Copy of Healthcare Power of Attorney in Chart? No - copy requested  No - copy requested No - copy requested  No - copy requested   Would patient like information on creating a medical advance directive?  No - Patient declined         Current Medications (verified) Outpatient Encounter Medications as of 08/29/2023  Medication Sig   amLODipine (NORVASC) 5 MG tablet TAKE  1 TABLET BY MOUTH EVERY DAY   aspirin EC 81 MG tablet Take 81 mg by mouth daily.   BIOTIN PO Take by mouth.   cholecalciferol (VITAMIN D) 1000 units tablet Take 1,000 Units by mouth daily.   nebivolol (BYSTOLIC) 10 MG tablet TAKE 1 TABLET BY MOUTH EVERY DAY   rosuvastatin (CRESTOR) 10 MG tablet TAKE 1 TABLET BY MOUTH EVERY DAY   vitamin E 400 UNIT capsule Take 400 Units by mouth daily.   oxyCODONE (ROXICODONE) 5 MG immediate release tablet Take 1 tablet (5 mg total) by mouth every 6 (six) hours as needed for severe pain (pain score 7-10). (Patient not taking: Reported on 08/29/2023)   No facility-administered encounter medications on file as of 08/29/2023.    Allergies (verified) Patient has no known allergies.   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 Edited by Renold Don, MD   GERD (gastroesophageal reflux disease)    Headache(784.0)    Hypercholesterolemia    Hypertension    Idiopathic urticaria    Varicose veins of lower extremities    Past Surgical History:  Procedure Laterality Date   AUGMENTATION MAMMAPLASTY Bilateral    benign breast biopsy     bilateral breast implants     BREAST BIOPSY     unsure which breast and when   BREAST BIOPSY Right 07/03/2023   [place] RT BREAST BX W LOC DEV EA ADD LESION IMG BX SPEC US GUIDE 07/03/2023 [facility]   BREAST BIOPSY Right 07/03/2023   [place] RT BREAST BX W LOC DEV 1ST LESION IMG BX SPEC US GUIDE 07/03/2023 [facility]   BREAST BIOPSY Right 08/08/2023   [place] RT RADIOACTIVE SEED EA ADD LESION 08/08/2023 [facility]   BREAST BIOPSY   08/08/2023   [place] RT RADIOACTIVE SEED LOC 08/08/2023 [facility]   BREAST IMPLANT REMOVAL Bilateral 08/11/2023   Procedure: BILATERAL REMOVAL BREAST IMPLANTS;  Surgeon: Peggye Form, DO;  Location: [facility];  Service: Plastics;  Laterality: Bilateral;   BREAST LUMPECTOMY WITH RADIOACTIVE SEED LOCALIZATION Right 08/11/2023   Procedure: RIGHT BREAST RADIOACTIVE SEED LOCALIZED LUMPECTOMY x2;  Surgeon: Griselda Miner, MD;  Location: [facility];  Service: General;  Laterality: Right;   cataract right eye w/ lens implant     COLONOSCOPY  03/20/2023   COLONOSCOPY  2012   Dr Jarold Motto, normal   excision of large anal polyp     MASTOPEXY Bilateral 08/11/2023   Procedure: MASTOPEXY;  Surgeon: Peggye Form, DO;  Location: [facility];  Service: Plastics;  Laterality: Bilateral;   PERIPHERAL VASCULAR CATHETERIZATION N/A 02/11/2016   Procedure: Renal Angiography;  Surgeon: Fransisco Hertz, MD;  Location: [facility];  Service: Cardiovascular;  Laterality: N/A;   PLACEMENT OF BREAST IMPLANTS Bilateral 08/11/2023   Procedure: PLACEMENT OF SILCONE IMPLANTS;  Surgeon: Peggye Form, DO;  Location: [facility];  Service: Plastics;  Laterality: Bilateral;   POLYPECTOMY     VAGINAL HYSTERECTOMY     varicose vein     Family History  Problem Relation Age of Onset   Hypertension Mother    Hyperlipidemia Mother    Diabetes Mother    Breast cancer Mother 13   Pulmonary embolism Mother    Deep vein thrombosis Mother    Pancreatic cancer Mother 59   Dementia Father    Leukemia Maternal Aunt    Sickle cell anemia Cousin    Colon cancer Neg Hx    Esophageal cancer Neg Hx    Stomach cancer Neg Hx    Colon polyps Neg Hx    Rectal cancer Neg Hx    Social History   Socioeconomic History   Marital status: Single    Spouse name: Not on file   Number of children: 0   Years of education: 12   Highest education level:  Not on file  Occupational History    Comment: [employer]  Tobacco Use   Smoking status: Never   Smokeless tobacco: Never  Vaping Use   Vaping status: Never Used  Substance and Sexual Activity   Alcohol use: No    Alcohol/week: 0.0 standard drinks of alcohol   Drug use: No   Sexual activity: Not on file  Other Topics Concern   Not on file  Social History Narrative   Lives alone   No caffeine use   Social Determinants of Health   Financial Resource Strain: Low Risk  (08/29/2023)   Overall Financial Resource Strain (CARDIA)    Difficulty of Paying Living Expenses: Not hard at all  Food Insecurity: No Food  Insecurity (08/29/2023)   Hunger Vital Sign    Worried About Running Out of Food in the Last Year: Never true    Ran Out of Food in the Last Year: Never true  Transportation Needs: No Transportation Needs (08/29/2023)   PRAPARE - Administrator, Civil Service (Medical): No    Lack of Transportation (Non-Medical): No  Physical Activity: Inactive (08/29/2023)   Exercise Vital Sign    Days of Exercise per Week: 0 days    Minutes of Exercise per Session: 0 min  Stress: No Stress Concern Present (08/29/2023)   [employer] of Occupational Health - Occupational Stress Questionnaire    Feeling of Stress : Not at all  Social Connections: Socially Isolated (08/29/2023)   Social Connection and Isolation Panel [NHANES]    Frequency of Communication with Friends and Family: More than three times a week    Frequency of Social Gatherings with Friends and Family: Once a week    Attends Religious Services: Never    Database administrator or Organizations: No    Attends Engineer, structural: Never    Marital Status: Never married    Tobacco Counseling Counseling given: Not Answered   Clinical Intake:  Pre-visit preparation completed: Yes  Pain : No/denies pain     BMI - recorded: 25.61 Nutritional Status: BMI 25 -29 Overweight Nutritional Risks:  None Diabetes: No  How often do you need to have someone help you when you read instructions, pamphlets, or other written materials from your doctor or pharmacy?: 1 - Never  Interpreter Needed?: No  Information entered by :: Lanier Ensign, LPN   Activities of Daily Living    08/29/2023    3:33 PM 08/11/2023   10:22 AM  In your present state of health, do you have any difficulty performing the following activities:  Hearing? 0 0  Vision? 0 0  Difficulty concentrating or making decisions? 0 0  Walking or climbing stairs? 0   Dressing or bathing? 0   Doing errands, shopping? 0   Preparing Food and eating ? N   Using the Toilet? N   In the past six months, have you accidently leaked urine? N   Do you have problems with loss of bowel control? N   Managing your Medications? N   Managing your Finances? N   Housekeeping or managing your Housekeeping? N     Patient Care Team: Allwardt, Crist Infante, PA-C as PCP - General (Physician Assistant) Carrington Clamp, MD as Consulting Physician (Obstetrics and Gynecology) Griselda Miner, MD as Consulting Physician (General Surgery) Rachel Moulds, MD as Consulting Physician (Hematology and Oncology) Dorothy Puffer, MD as Consulting Physician (Radiation Oncology) Pershing Proud, RN as Registered Nurse Donnelly Angelica, RN as Registered Nurse  Indicate any recent Medical Services you may have received from other than [facility] providers in the past year (date may be approximate).     Assessment:   This is a routine wellness examination for Raynesha.  Hearing/Vision screen Hearing Screening - Comments:: Pt denies any hearing issues  Vision Screening - Comments:: Pt follows up with [facility] for annual eye exams    Goals Addressed             This Visit's Progress    Patient Stated       Lose 20 lbs        Depression Screen    08/29/2023    3:36 PM 07/13/2023    1:33 PM 01/30/2023  1:36 PM 08/19/2022    2:35 PM 08/06/2021     2:45 PM 03/11/2021    3:33 PM 07/09/2020    2:07 PM  PHQ 2/9 Scores  PHQ - 2 Score 0 0 0 0 0 0 0    Fall Risk    08/29/2023    3:38 PM 01/30/2023    1:36 PM 08/19/2022    2:38 PM 08/06/2021    2:51 PM 03/11/2021    3:33 PM  Fall Risk   Falls in the past year? 0 0 0 0 0  Number falls in past yr: 0 0 0 0   Injury with Fall? 0 0 0 0   Risk for fall due to : No Fall Risks No Fall Risks Impaired vision Impaired balance/gait;Impaired vision No Fall Risks  Follow up Falls prevention discussed Falls evaluation completed Falls prevention discussed Falls prevention discussed     MEDICARE RISK AT HOME: Medicare Risk at Home Any stairs in or around the home?: Yes If so, are there any without handrails?: No Home free of loose throw rugs in walkways, pet beds, electrical cords, etc?: Yes Adequate lighting in your home to reduce risk of falls?: Yes Life alert?: Yes (apple watch) Use of a cane, walker or w/c?: No Grab bars in the bathroom?: Yes Shower chair or bench in shower?: Yes Elevated toilet seat or a handicapped toilet?: Yes  TIMED UP AND GO:  Was the test performed?  No    Cognitive Function:        08/29/2023    3:39 PM 08/19/2022    2:38 PM 08/06/2021    2:53 PM 12/16/2019    3:00 PM  6CIT Screen  What Year? 0 points 0 points 0 points 0 points  What month? 0 points 0 points 0 points 0 points  What time? 0 points 0 points 0 points 0 points  Count back from 20 0 points 0 points 0 points 0 points  Months in reverse 0 points 0 points 0 points 0 points  Repeat phrase 0 points 0 points 2 points 0 points  Total Score 0 points 0 points 2 points 0 points    Immunizations Immunization History  Administered Date(s) Administered   Fluad Quad(high Dose 65+) 06/17/2019, 07/09/2020, 06/23/2021, 07/29/2022   Fluad Trivalent(High Dose 65+) 08/24/2023   Influenza Split 06/13/2011, 07/19/2012   Influenza Whole 07/14/2009, 07/14/2010   Influenza,inj,Quad PF,6+ Mos 08/13/2013,  07/28/2014, 08/25/2015, 05/26/2016, 08/03/2017, 06/21/2018   Influenza-Unspecified 06/19/2016   MODERNA COVID-19 SARS-COV-2 PEDS BIVALENT BOOSTER 54yr-48yr 10/18/2022   PFIZER(Purple Top)SARS-COV-2 Vaccination 11/27/2019, 12/18/2019, 07/31/2020, 03/11/2021, 07/07/2021   PNEUMOCOCCAL CONJUGATE-20 07/29/2022   Pfizer Covid-19 Vaccine Bivalent Booster 77yrs & up 02/23/2022   Pneumococcal Polysaccharide-23 12/13/2018   Td 07/14/2010   Unspecified SARS-COV-2 Vaccination 06/13/2023, 06/14/2023   Zoster Recombinant(Shingrix) 03/11/2021, 07/07/2021    TDAP status: Due, Education has been provided regarding the importance of this vaccine. Advised may receive this vaccine at local pharmacy or Health Dept. Aware to provide a copy of the vaccination record if obtained from local pharmacy or Health Dept. Verbalized acceptance and understanding.  Flu Vaccine status: Up to date  Pneumococcal vaccine status: Up to date  Covid-19 vaccine status: Information provided on how to obtain vaccines.   Qualifies for Shingles Vaccine? Yes   Zostavax completed Yes   Shingrix Completed?: Yes  Screening Tests Health Maintenance  Topic Date Due   DTaP/Tdap/Td (2 - Tdap) 07/14/2020   COVID-19 Vaccine (9 - 2023-24 season) 08/09/2023  MAMMOGRAM  07/15/2024   Medicare Annual Wellness (AWV)  08/28/2024   DEXA SCAN  01/05/2025   Colonoscopy  03/19/2030   Pneumonia Vaccine 97+ Years old  Completed   INFLUENZA VACCINE  Completed   Hepatitis C Screening  Completed   Zoster Vaccines- Shingrix  Completed   HPV VACCINES  Aged Out    Health Maintenance  Health Maintenance Due  Topic Date Due   DTaP/Tdap/Td (2 - Tdap) 07/14/2020   COVID-19 Vaccine (9 - 2023-24 season) 08/09/2023    Colorectal cancer screening: Type of screening: Colonoscopy. Completed 03/20/23. Repeat every 7 years  Mammogram status: Completed 07/16/23. Repeat every year  Bone Density status: Completed 01/06/20. Results reflect: Bone density  results: NORMAL. Repeat every 5 years.   Additional Screening:  Hepatitis C Screening:  Completed 01/17/17  Vision Screening: Recommended annual ophthalmology exams for early detection of glaucoma and other disorders of the eye. Is the patient up to date with their annual eye exam?  Yes  Who is the provider or what is the name of the office in which the patient attends annual eye exams? [facility]  If pt is not established with a provider, would they like to be referred to a provider to establish care? No .   Dental Screening: Recommended annual dental exams for proper oral hygiene   Community Resource Referral / Chronic Care Management: CRR required this visit?  No   CCM required this visit?  No     Plan:     I have personally reviewed and noted the following in the patient's chart:   Medical and social history Use of alcohol, tobacco or illicit drugs  Current medications and supplements including opioid prescriptions. Patient is not currently taking opioid prescriptions. Functional ability and status Nutritional status Physical activity Advanced directives List of other physicians Hospitalizations, surgeries, and ER visits in previous 12 months Vitals Screenings to include cognitive, depression, and falls Referrals and appointments  In addition, I have reviewed and discussed with patient certain preventive protocols, quality metrics, and best practice recommendations. A written personalized care plan for preventive services as well as general preventive health recommendations were provided to patient.     Marzella Schlein, LPN   [id number]/06/[id number]   After Visit Summary: (MyChart) Due to this being a telephonic visit, the after visit summary with patients personalized plan was offered to patient via MyChart   Nurse Notes: none

## 2023-08-30 ENCOUNTER — Other Ambulatory Visit: Payer: Self-pay | Admitting: Physician Assistant

## 2023-09-01 ENCOUNTER — Ambulatory Visit (INDEPENDENT_AMBULATORY_CARE_PROVIDER_SITE_OTHER): Payer: Medicare Other | Admitting: Plastic Surgery

## 2023-09-01 ENCOUNTER — Encounter: Payer: Self-pay | Admitting: Plastic Surgery

## 2023-09-01 VITALS — BP 129/70 | HR 70

## 2023-09-01 DIAGNOSIS — Z170 Estrogen receptor positive status [ER+]: Secondary | ICD-10-CM

## 2023-09-01 DIAGNOSIS — C50811 Malignant neoplasm of overlapping sites of right female breast: Secondary | ICD-10-CM

## 2023-09-01 NOTE — Progress Notes (Signed)
The patient is a 72 year old female here for follow-up after undergoing breast implant removal and replacement along with a partial mastectomy.  On November 22 she had a right partial mastectomy with removal and replacement of her implants.  She now has Mentor smooth round high-profile gel 200 cc implants bilaterally.  There is no sign of infection or hematoma.  Patient is doing well and pleased with her results.  She has a little bruising and swelling from the mastopexy.  But as expected for this timeframe.  Overall she is doing really well.  Pictures were obtained of the patient and placed in the chart with the patient's or guardian's permission.

## 2023-09-05 ENCOUNTER — Inpatient Hospital Stay: Payer: Medicare Other | Attending: Hematology and Oncology | Admitting: Hematology and Oncology

## 2023-09-05 VITALS — BP 137/62 | HR 75 | Temp 97.7°F | Resp 16 | Wt 139.1 lb

## 2023-09-05 DIAGNOSIS — C50811 Malignant neoplasm of overlapping sites of right female breast: Secondary | ICD-10-CM | POA: Diagnosis not present

## 2023-09-05 DIAGNOSIS — Z170 Estrogen receptor positive status [ER+]: Secondary | ICD-10-CM | POA: Diagnosis not present

## 2023-09-05 DIAGNOSIS — N951 Menopausal and female climacteric states: Secondary | ICD-10-CM | POA: Diagnosis not present

## 2023-09-05 NOTE — Progress Notes (Signed)
[facility] CONSULT NOTE  Patient Care Team: Allwardt, Crist Infante, PA-C as PCP - General (Physician Assistant) Carrington Clamp, MD as Consulting Physician (Obstetrics and Gynecology) Griselda Miner, MD as Consulting Physician (General Surgery) Rachel Moulds, MD as Consulting Physician (Hematology and Oncology) Dorothy Puffer, MD as Consulting Physician (Radiation Oncology) Pershing Proud, RN as Registered Nurse Donnelly Angelica, RN as Registered Nurse  CHIEF COMPLAINTS/PURPOSE OF CONSULTATION:  Newly diagnosed breast cancer  HISTORY OF PRESENTING ILLNESS:  [name] [name] 72 y.o. female is here because of recent diagnosis of right breast cancer  I reviewed her records extensively and collaborated the history with the patient.  SUMMARY OF ONCOLOGIC HISTORY: Oncology History  Malignant neoplasm of overlapping sites of right breast in female, estrogen receptor positive (HCC)  06/29/2023 Mammogram   Mammogram October 2024 showed right breast mass at 1:00 at the palpable site of concern 4 cm from the nipple measuring 7 x 5 x 6 mm.  Adjacent to this is a complex circumscribed mass measuring 1.6 x 1.1 x 1.2 cm.  Ultrasound of the right axilla demonstrated normal lymph nodes.   07/03/2023 Pathology Results   Right breast needle core biopsy showed invasive mammary carcinoma with mucinous features, DCIS with cribriform pattern and intermediate grade, overall grade of the invasive, (2, prognostics showed ER 9089% strong staining, PR 99% positive strong staining, HER2 0 and Ki-67 of 10%   07/10/2023 Initial Diagnosis   Malignant neoplasm of upper-inner quadrant of right breast in female, estrogen receptor positive (HCC)   07/10/2023 Cancer Staging   Staging form: Breast, AJCC 8th Edition - Clinical: Stage IA (cT1c, cN0, cM0, G2, ER+, PR+, HER2-) - Signed by Ronny Bacon, PA-C on 07/10/2023 Method of lymph node assessment: Clinical Histologic grading system: 3 grade  system    Genetic Testing   [facility] CancerNext Panel+RNA was Negative. Report date is 07/27/2023.   The CancerNext gene panel offered by [employer] includes sequencing, rearrangement analysis, and RNA analysis for the following 34 genes:   APC, ATM, AXIN2, BARD1, BMPR1A, BRCA1, BRCA2, BRIP1, CDH1, CDK4, CDKN2A, CHEK2, DICER1, HOXB13, EPCAM, GREM1, MLH1, MSH2, MSH3, MSH6, MUTYH, NF1, NTHL1, PALB2, PMS2, POLD1, POLE, PTEN, RAD51C, RAD51D, SMAD4, SMARCA4, STK11, and TP53.    08/11/2023 Definitive Surgery   A. BREAST, RIGHT W/SEEDS, LUMPECTOMY:  Invasive ductal carcinoma with extracellular mucin, two tumors, 1.9 and  0.5 cm, grade 2  Ductal carcinoma in situ: Cribriform, intermediate nuclear grade  Margins, invasive:- Negative      Closest, invasive: 1 mm, anterior (coil clip)  Margins, DCIS: Negative      Closest, DCIS: 2 mm, inferior  Lymphovascular invasion: Not identified  Prognostic markers:  ER positive, PR positive, Her2 negative, Ki-67 10%  Other: Atypical ductal hyperplasia and intraductal papillomas  See oncology table   B. BREAST, RIGHT ADDITIONAL INFERIOR MEDIAL MARGIN, EXCISION:  Ductal carcinoma in situ, intermediate grade  Atypical ductal hyperplasia and intraductal papillomas  DCIS 2 mm from inferior margin      Discussed the use of AI scribe software for clinical note transcription with the patient, who gave verbal consent to proceed.  History of Present Illness    The patient, with a recent history of breast cancer, underwent a lumpectomy on November 22nd. Two tumors were removed, one measuring 1.9 cm and the other 0.5 cm. The patient reports no pain post-surgery and did not require any pain medication. However, she experienced diarrhea as a side effect of the antibiotics prescribed. The  patient is currently awaiting the results of an Oncotype test, which was sent for a second time due to an insufficient sample from the first test. The patient also mentions a brief  history of hot flashes, which have since subsided and are not currently bothersome. Rest of the pertinent 10 point ROS reviewed and neg.  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 Edited by Renold Don, MD   GERD (gastroesophageal reflux disease)    Headache(784.0)    Hypercholesterolemia    Hypertension    Idiopathic urticaria    Varicose veins of lower extremities     SURGICAL HISTORY: Past Surgical History:  Procedure Laterality Date   AUGMENTATION MAMMAPLASTY Bilateral    benign breast biopsy     bilateral breast implants     BREAST BIOPSY     unsure which breast and when   BREAST BIOPSY Right 07/03/2023   [place] RT BREAST BX W LOC DEV EA ADD LESION IMG BX SPEC US GUIDE 07/03/2023 [facility]   BREAST BIOPSY Right 07/03/2023   [place] RT BREAST BX W LOC DEV 1ST LESION IMG BX SPEC US GUIDE 07/03/2023 [facility]   BREAST BIOPSY Right 08/08/2023   [place] RT RADIOACTIVE SEED EA ADD LESION 08/08/2023 [facility]   BREAST BIOPSY  08/08/2023   [place] RT RADIOACTIVE SEED LOC 08/08/2023 [facility]   BREAST IMPLANT REMOVAL Bilateral 08/11/2023   Procedure: BILATERAL REMOVAL BREAST IMPLANTS;  Surgeon: Peggye Form, DO;  Location: [facility];  Service: Plastics;  Laterality: Bilateral;   BREAST LUMPECTOMY WITH RADIOACTIVE SEED LOCALIZATION Right 08/11/2023   Procedure: RIGHT BREAST RADIOACTIVE SEED LOCALIZED LUMPECTOMY x2;  Surgeon: Griselda Miner, MD;  Location: [facility];  Service: General;  Laterality: Right;   cataract right eye w/ lens implant      COLONOSCOPY  03/20/2023   COLONOSCOPY  2012   Dr Jarold Motto, normal   excision of large anal polyp     MASTOPEXY Bilateral 08/11/2023   Procedure: MASTOPEXY;  Surgeon: Peggye Form, DO;  Location: [facility];  Service: Plastics;  Laterality: Bilateral;   PERIPHERAL VASCULAR CATHETERIZATION N/A 02/11/2016   Procedure: Renal Angiography;  Surgeon: Fransisco Hertz, MD;  Location: [facility];  Service: Cardiovascular;  Laterality: N/A;   PLACEMENT OF BREAST IMPLANTS Bilateral 08/11/2023   Procedure: PLACEMENT OF SILCONE IMPLANTS;  Surgeon: Peggye Form, DO;  Location: [facility];  Service: Plastics;  Laterality: Bilateral;   POLYPECTOMY     VAGINAL HYSTERECTOMY     varicose vein      SOCIAL HISTORY: Social History   Socioeconomic History   Marital status: Single    Spouse name: Not on file   Number of children: 0   Years of education: 12   Highest education level: Not on file  Occupational History    Comment: [employer]  Tobacco Use   Smoking status: Never   Smokeless tobacco: Never  Vaping Use   Vaping status: Never Used  Substance and Sexual Activity   Alcohol use: No    Alcohol/week: 0.0  standard drinks of alcohol   Drug use: No   Sexual activity: Not on file  Other Topics Concern   Not on file  Social History Narrative   Lives alone   No caffeine use   Social Drivers of Health   Financial Resource Strain: Low Risk  (08/29/2023)   Overall Financial Resource Strain (CARDIA)    Difficulty of Paying Living Expenses: Not hard at all  Food Insecurity: No Food Insecurity (08/29/2023)   Hunger Vital Sign    Worried About Running Out of Food in the Last Year: Never true    Ran Out of Food in the Last Year: Never true  Transportation Needs: No Transportation Needs (08/29/2023)   PRAPARE - Administrator, Civil Service (Medical): No    Lack of Transportation (Non-Medical): No  Physical Activity: Inactive  (08/29/2023)   Exercise Vital Sign    Days of Exercise per Week: 0 days    Minutes of Exercise per Session: 0 min  Stress: No Stress Concern Present (08/29/2023)   [employer] of Occupational Health - Occupational Stress Questionnaire    Feeling of Stress : Not at all  Social Connections: Socially Isolated (08/29/2023)   Social Connection and Isolation Panel [NHANES]    Frequency of Communication with Friends and Family: More than three times a week    Frequency of Social Gatherings with Friends and Family: Once a week    Attends Religious Services: Never    Database administrator or Organizations: No    Attends Banker Meetings: Never    Marital Status: Never married  Intimate Partner Violence: Not At Risk (08/29/2023)   Humiliation, Afraid, Rape, and Kick questionnaire    Fear of Current or Ex-Partner: No    Emotionally Abused: No    Physically Abused: No    Sexually Abused: No    FAMILY HISTORY: Family History  Problem Relation Age of Onset   Hypertension Mother    Hyperlipidemia Mother    Diabetes Mother    Breast cancer Mother 69   Pulmonary embolism Mother    Deep vein thrombosis Mother    Pancreatic cancer Mother 37   Dementia Father    Leukemia Maternal Aunt    Sickle cell anemia Cousin    Colon cancer Neg Hx    Esophageal cancer Neg Hx    Stomach cancer Neg Hx    Colon polyps Neg Hx    Rectal cancer Neg Hx     ALLERGIES:  has no known allergies.  MEDICATIONS:  Current Outpatient Medications  Medication Sig Dispense Refill   amLODipine (NORVASC) 5 MG tablet TAKE 1 TABLET BY MOUTH EVERY DAY 90 tablet 1   aspirin EC 81 MG tablet Take 81 mg by mouth daily.     BIOTIN PO Take by mouth.     cholecalciferol (VITAMIN D) 1000 units tablet Take 1,000 Units by mouth daily.     nebivolol (BYSTOLIC) 10 MG tablet TAKE 1 TABLET BY MOUTH EVERY DAY 90 tablet 1   rosuvastatin (CRESTOR) 10 MG tablet TAKE 1 TABLET BY MOUTH EVERY DAY 90 tablet 3   vitamin  E 400 UNIT capsule Take 400 Units by mouth daily.     No current facility-administered medications for this visit.    REVIEW OF SYSTEMS:   Constitutional: Denies fevers, chills or abnormal night sweats Eyes: Denies blurriness of vision, double vision or watery eyes Ears, nose, mouth, throat, and face: Denies mucositis or sore throat Respiratory: Denies  cough, dyspnea or wheezes Cardiovascular: Denies palpitation, chest discomfort or lower extremity swelling Gastrointestinal:  Denies nausea, heartburn or change in bowel habits Skin: Denies abnormal skin rashes Lymphatics: Denies new lymphadenopathy or easy bruising Neurological:Denies numbness, tingling or new weaknesses Behavioral/Psych: Mood is stable, no new changes  Breast: Denies any palpable lumps or discharge All other systems were reviewed with the patient and are negative.  PHYSICAL EXAMINATION: ECOG PERFORMANCE STATUS: 0 - Asymptomatic  Vitals:   09/05/23 1010  BP: 137/62  Pulse: 75  Resp: 16  Temp: 97.7 F (36.5 C)  SpO2: 100%   Filed Weights   09/05/23 1010  Weight: 139 lb 1.6 oz (63.1 kg)    GENERAL:alert, no distress and comfortable BREAST: Bilateral breast surgical scars healing well.  LABORATORY DATA:  I have reviewed the data as listed Lab Results  Component Value Date   WBC 5.2 07/12/2023   HGB 12.3 07/12/2023   HCT 36.4 07/12/2023   MCV 88.3 07/12/2023   PLT 272 07/12/2023   Lab Results  Component Value Date   NA 141 07/12/2023   K 3.0 (L) 07/12/2023   CL 103 07/12/2023   CO2 31 07/12/2023    RADIOGRAPHIC STUDIES: I have personally reviewed the radiological reports and agreed with the findings in the report.  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 Two tumors removed on 08/11/2023, one 1.9 cm and the other 0.5 cm, both with high mucin content. Margins were negative. Tumors are estrogen  and progesterone positive. Oncotype test was sent but returned as insufficient sample. A second sample was sent on 08/28/2023, results pending. - Await Oncotype results. - Plan for radiation therapy and anti-estrogen therapy (5 years) post-radiation, pending Oncotype results. - Follow-up appointment with Dr. Carolynne Edouard on 09/08/2023.  Post-Surgical Recovery Patient reports good healing with no pain. Antibiotic-associated diarrhea reported. - Encouraged probiotic intake (yogurt) to restore gut biome.  Hot Flashes Patient reports minimal symptoms, not requiring medication. - No intervention needed at this time.  Follow-up - Nurse practitioner to call patient with Oncotype results when available. - Next appointment to be scheduled at check-out.   All questions were answered. The patient knows to call the clinic with any problems, questions or concerns.    Rachel Moulds, MD 09/05/23

## 2023-09-05 NOTE — Assessment & Plan Note (Signed)
Right breast IDC. Two tumors removed on 08/11/2023, one 1.9 cm and the other 0.5 cm, both with high mucin content. Margins were negative. Tumors are estrogen and progesterone positive. Oncotype test was sent but returned as insufficient sample. A second sample was sent on 08/28/2023, results pending. - Await Oncotype results. - Plan for radiation therapy and anti-estrogen therapy (5 years) post-radiation, pending Oncotype results. - Follow-up appointment with Dr. Carolynne Edouard on 09/08/2023.  Post-Surgical Recovery Patient reports good healing with no pain. Antibiotic-associated diarrhea reported. - Encouraged probiotic intake (yogurt) to restore gut biome.  Hot Flashes Patient reports minimal symptoms, not requiring medication. - No intervention needed at this time.  Follow-up - Nurse practitioner to call patient with Oncotype results when available. - Next appointment to be scheduled at check-out.

## 2023-09-07 ENCOUNTER — Encounter: Payer: Self-pay | Admitting: *Deleted

## 2023-09-08 DIAGNOSIS — Z170 Estrogen receptor positive status [ER+]: Secondary | ICD-10-CM | POA: Diagnosis not present

## 2023-09-08 DIAGNOSIS — C50811 Malignant neoplasm of overlapping sites of right female breast: Secondary | ICD-10-CM | POA: Diagnosis not present

## 2023-09-11 ENCOUNTER — Telehealth: Payer: Self-pay | Admitting: *Deleted

## 2023-09-11 ENCOUNTER — Encounter: Payer: Medicare Other | Admitting: Surgical

## 2023-09-11 ENCOUNTER — Encounter (HOSPITAL_COMMUNITY): Payer: Self-pay

## 2023-09-11 ENCOUNTER — Encounter: Payer: Self-pay | Admitting: *Deleted

## 2023-09-11 DIAGNOSIS — C50811 Malignant neoplasm of overlapping sites of right female breast: Secondary | ICD-10-CM

## 2023-09-11 NOTE — Telephone Encounter (Signed)
Received oncotype results 14/4%. Referral placed for Dr. Mitzi Hansen

## 2023-09-14 ENCOUNTER — Telehealth: Payer: Self-pay | Admitting: Radiation Oncology

## 2023-09-14 ENCOUNTER — Telehealth: Payer: Self-pay | Admitting: *Deleted

## 2023-09-14 NOTE — Telephone Encounter (Signed)
Spoke to pt to get her scheduled with Dr. Willeen Niece for CON30/SIM.

## 2023-09-14 NOTE — Telephone Encounter (Signed)
Spoke with patient and informed her of the oncotype results on 2nd specimen 14.  Patient will not need chemo.  She will be called to schedule xrt.

## 2023-09-14 NOTE — Telephone Encounter (Signed)
Called pt to schedule CON30/SIM with Dr. Willeen Niece. Pt stated she was waiting to be updated on oncotype results since first time was insufficient. Pt was advised we received this referral right before Christmas so results might have been sent with update since. Pt states she is supposed to have appt with Dr. Al Pimple 12/30 to review these results since they would indicate when radiation would be needed. Pt was advised our appts would be AFTER this. She still did not see the point in scheduling; pt was adamant and refused to schedule until 12/30. I explained to pt delaying scheduling may push her appts even further out, she stated that was fine. I advised I wold reach out after her 12/30 appt as requested. Message routed to [facility] Team to hopefully speak with pt about this before next appt.

## 2023-09-15 ENCOUNTER — Encounter: Payer: Self-pay | Admitting: *Deleted

## 2023-09-18 ENCOUNTER — Inpatient Hospital Stay (HOSPITAL_BASED_OUTPATIENT_CLINIC_OR_DEPARTMENT_OTHER): Payer: Medicare Other | Admitting: Adult Health

## 2023-09-18 DIAGNOSIS — Z170 Estrogen receptor positive status [ER+]: Secondary | ICD-10-CM

## 2023-09-18 DIAGNOSIS — C50811 Malignant neoplasm of overlapping sites of right female breast: Secondary | ICD-10-CM | POA: Diagnosis not present

## 2023-09-18 NOTE — Progress Notes (Signed)
[facility] Cancer Follow up:    [name] [name], [name] [name] Infante, PA-C [address] [place] [address]   DIAGNOSIS:  Cancer Staging  Malignant neoplasm of overlapping sites of right breast in female, estrogen receptor positive (HCC) Staging form: Breast, AJCC 8th Edition - Clinical: Stage IA (cT1c, cN0, cM0, G2, ER+, PR+, HER2-) - Signed by Ronny Bacon, PA-C on 07/10/2023 Method of lymph node assessment: Clinical Histologic grading system: 3 grade system - Pathologic stage from 08/11/2023: Stage IA (pT1c, pN0, cM0, G2, ER+, PR+, HER2-) - Signed by Loa Socks, NP on 09/18/2023 Stage prefix: Initial diagnosis Histologic grading system: 3 grade system  I connected with [name] [name] on 09/18/23 at  8:30 AM EST by telephone and verified that I am speaking with the correct person using two identifiers.  I discussed the limitations, risks, security and privacy concerns of performing an evaluation and management service by telephone and the availability of in person appointments.  I also discussed with the patient that there may be a patient responsible charge related to this service. The patient expressed understanding and agreed to proceed.   Patient location: Home Provider location: [facility] office  SUMMARY OF ONCOLOGIC HISTORY: Oncology History  Malignant neoplasm of overlapping sites of right breast in female, estrogen receptor positive (HCC)  06/29/2023 Mammogram   Mammogram October 2024 showed right breast mass at 1:00 at the palpable site of concern 4 cm from the nipple measuring 7 x 5 x 6 mm.  Adjacent to this is a complex circumscribed mass measuring 1.6 x 1.1 x 1.2 cm.  Ultrasound of the right axilla demonstrated normal lymph nodes.   07/03/2023 Pathology Results   Right breast needle core biopsy showed invasive mammary carcinoma with mucinous features, DCIS with cribriform pattern and intermediate grade, overall grade of the invasive, (2,  prognostics showed ER 9089% strong staining, PR 99% positive strong staining, HER2 0 and Ki-67 of 10%   07/10/2023 Initial Diagnosis   Malignant neoplasm of upper-inner quadrant of right breast in female, estrogen receptor positive (HCC)   07/10/2023 Cancer Staging   Staging form: Breast, AJCC 8th Edition - Clinical: Stage IA (cT1c, cN0, cM0, G2, ER+, PR+, HER2-) - Signed by Ronny Bacon, PA-C on 07/10/2023 Method of lymph node assessment: Clinical Histologic grading system: 3 grade system    Genetic Testing   [facility] CancerNext Panel+RNA was Negative. Report date is 07/27/2023.   The CancerNext gene panel offered by [employer] includes sequencing, rearrangement analysis, and RNA analysis for the following 34 genes:   APC, ATM, AXIN2, BARD1, BMPR1A, BRCA1, BRCA2, BRIP1, CDH1, CDK4, CDKN2A, CHEK2, DICER1, HOXB13, EPCAM, GREM1, MLH1, MSH2, MSH3, MSH6, MUTYH, NF1, NTHL1, PALB2, PMS2, POLD1, POLE, PTEN, RAD51C, RAD51D, SMAD4, SMARCA4, STK11, and TP53.    08/11/2023 Definitive Surgery   A. BREAST, RIGHT W/SEEDS, LUMPECTOMY:  Invasive ductal carcinoma with extracellular mucin, two tumors, 1.9 and  0.5 cm, grade 2  Ductal carcinoma in situ: Cribriform, intermediate nuclear grade  Margins, invasive:- Negative      Closest, invasive: 1 mm, anterior (coil clip)  Margins, DCIS: Negative      Closest, DCIS: 2 mm, inferior  Lymphovascular invasion: Not identified  Prognostic markers:  ER positive, PR positive, Her2 negative, Ki-67 10%  Other: Atypical ductal hyperplasia and intraductal papillomas  See oncology table   B. BREAST, RIGHT ADDITIONAL INFERIOR MEDIAL MARGIN, EXCISION:  Ductal carcinoma in situ, intermediate grade  Atypical ductal hyperplasia and intraductal papillomas  DCIS 2 mm  from inferior margin     08/11/2023 Oncotype testing   14/4%, <1% chemo benefit     CURRENT THERAPY: s/p surgery, to begin radiation  INTERVAL HISTORY: [name] [name] 72 y.o. female  returns for f/u to discuss her oncotype results.  According to Dr. Remonia Richter most recent office visit note her initial Oncotype reported insufficient carcinoma present.  Her most recent Oncotype score result was 14 giving her distant recurrence risk at 9 years of 4% and less than 1% chemotherapy benefit.    She is feeling well since completing her surgery and has follow-up with plastic surgery soon.  She also has radiation oncology consultation that has been scheduled for September 28, 2023 accompanied by simulation.   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 Edited by Renold Don, MD   GERD (gastroesophageal reflux disease)    Headache(784.0)    Hypercholesterolemia    Hypertension    Idiopathic urticaria    Varicose veins of lower extremities     SURGICAL HISTORY: Past Surgical History:  Procedure  Laterality Date   AUGMENTATION MAMMAPLASTY Bilateral    benign breast biopsy     bilateral breast implants     BREAST BIOPSY     unsure which breast and when   BREAST BIOPSY Right 07/03/2023   [place] RT BREAST BX W LOC DEV EA ADD LESION IMG BX SPEC US GUIDE 07/03/2023 [facility]   BREAST BIOPSY Right 07/03/2023   [place] RT BREAST BX W LOC DEV 1ST LESION IMG BX SPEC US GUIDE 07/03/2023 [facility]   BREAST BIOPSY Right 08/08/2023   [place] RT RADIOACTIVE SEED EA ADD LESION 08/08/2023 [facility]   BREAST BIOPSY  08/08/2023   [place] RT RADIOACTIVE SEED LOC 08/08/2023 [facility]MO[facility]   BREAST IMPLANT REMOVAL Bilateral 08/11/2023   Procedure: BILATERAL REMOVAL BREAST IMPLANTS;  Surgeon: Peggye Form, DO;  Location: [facility];  Service: Plastics;  Laterality: Bilateral;   BREAST LUMPECTOMY WITH RADIOACTIVE SEED LOCALIZATION Right 08/11/2023   Procedure: RIGHT BREAST RADIOACTIVE SEED LOCALIZED LUMPECTOMY x2;  Surgeon: Griselda Miner, MD;  Location: [facility];  Service: General;  Laterality: Right;   cataract right eye w/ lens implant     COLONOSCOPY  03/20/2023   COLONOSCOPY  2012   Dr Jarold Motto, normal   excision of large anal polyp     MASTOPEXY Bilateral 08/11/2023   Procedure: MASTOPEXY;  Surgeon:  Dillingham, Alena Bills, DO;  Location: [facility];  Service: Plastics;  Laterality: Bilateral;   PERIPHERAL VASCULAR CATHETERIZATION N/A 02/11/2016   Procedure: Renal Angiography;  Surgeon: Fransisco Hertz, MD;  Location: [facility] INVASIVE CV LAB;  Service: Cardiovascular;  Laterality: N/A;   PLACEMENT OF BREAST IMPLANTS Bilateral 08/11/2023   Procedure: PLACEMENT OF SILCONE IMPLANTS;  Surgeon: Peggye Form, DO;  Location: [facility];  Service: Plastics;  Laterality: Bilateral;   POLYPECTOMY     VAGINAL HYSTERECTOMY     varicose vein      SOCIAL HISTORY: Social History   Socioeconomic History   Marital status:  Single    Spouse name: Not on file   Number of children: 0   Years of education: 12   Highest education level: Not on file  Occupational History    Comment: [employer]  Tobacco Use   Smoking status: Never   Smokeless tobacco: Never  Vaping Use   Vaping status: Never Used  Substance and Sexual Activity   Alcohol use: No    Alcohol/week: 0.0 standard drinks of alcohol   Drug use: No   Sexual activity: Not on file  Other Topics Concern   Not on file  Social History Narrative   Lives alone   No caffeine use   Social Drivers of Corporate investment banker Strain: Low Risk  (08/29/2023)   Overall Financial Resource Strain (CARDIA)    Difficulty of Paying Living Expenses: Not hard at all  Food Insecurity: No Food Insecurity (08/29/2023)   Hunger Vital Sign    Worried About Running Out of Food in the Last Year: Never true    Ran Out of Food in the Last Year: Never true  Transportation Needs: No Transportation Needs (08/29/2023)   PRAPARE - Administrator, Civil Service (Medical): No    Lack of Transportation (Non-Medical): No  Physical Activity: Inactive (08/29/2023)   Exercise Vital Sign    Days of Exercise per Week: 0 days    Minutes of Exercise per Session: 0 min  Stress: No Stress Concern Present (08/29/2023)   [employer] of Occupational Health - Occupational Stress Questionnaire    Feeling of Stress : Not at all  Social Connections: Socially Isolated (08/29/2023)   Social Connection and Isolation Panel [NHANES]    Frequency of Communication with Friends and Family: More than three times a week    Frequency of Social Gatherings with Friends and Family: Once a week    Attends Religious Services: Never    Database administrator or Organizations: No    Attends Banker Meetings: Never    Marital Status: Never married  Intimate Partner Violence: Not At Risk (08/29/2023)   Humiliation, Afraid, Rape, and Kick questionnaire    Fear of Current or  Ex-Partner: No    Emotionally Abused: No    Physically Abused: No    Sexually Abused: No    FAMILY HISTORY: Family History  Problem Relation Age of Onset   Hypertension Mother    Hyperlipidemia Mother    Diabetes Mother    Breast cancer Mother 59   Pulmonary embolism Mother    Deep vein thrombosis Mother    Pancreatic cancer Mother 53   Dementia Father    Leukemia Maternal Aunt    Sickle cell anemia Cousin    Colon cancer Neg Hx    Esophageal cancer Neg Hx    Stomach cancer Neg Hx    Colon  polyps Neg Hx    Rectal cancer Neg Hx     Review of Systems  Constitutional:  Negative for appetite change, chills, fatigue, fever and unexpected weight change.  HENT:   Negative for hearing loss, lump/mass and trouble swallowing.   Eyes:  Negative for eye problems and icterus.  Respiratory:  Negative for chest tightness, cough and shortness of breath.   Cardiovascular:  Negative for chest pain, leg swelling and palpitations.  Gastrointestinal:  Negative for abdominal distention, abdominal pain, constipation, diarrhea, nausea and vomiting.  Endocrine: Negative for hot flashes.  Genitourinary:  Negative for difficulty urinating.   Musculoskeletal:  Negative for arthralgias.  Skin:  Negative for itching and rash.  Neurological:  Negative for dizziness, extremity weakness, headaches and numbness.  Hematological:  Negative for adenopathy. Does not bruise/bleed easily.  Psychiatric/Behavioral:  Negative for depression. The patient is not nervous/anxious.       PHYSICAL EXAMINATION  Patient sounds well.  She is in no apparent distress.  Mood and behavior are normal.  Speech is normal.    ASSESSMENT and THERAPY PLAN:   Malignant neoplasm of overlapping sites of right breast in female, estrogen receptor positive [facility]) [name] [name] is a 72 year old woman with stage Ia ER/PR positive breast cancer diagnosed in October 2024 status postlumpectomy here to discuss results about recent Oncotype and  next plans.  Stage Ia breast cancer: I reviewed her Oncotype results with her and she is at low risk for distant recurrence.  We discussed this in detail.  We reviewed next steps which include adjuvant radiation and antiestrogen therapy.  We discussed anastrozole briefly.  [name] [name] will return as scheduled for radiation and we will see her back in 6 weeks for follow-up with Dr. Al Pimple to discuss antiestrogen therapy further.   The patient was provided an opportunity to ask questions and all were answered. The patient agreed with the plan and demonstrated an understanding of the instructions.   The patient was advised to call back or seek an in-person evaluation if the symptoms worsen or if the condition fails to improve as anticipated.   I provided 15 minutes of non face-to-face telephone visit time during this encounter, and > 50% was spent counseling as documented under my assessment & plan.     Lillard Anes, NP 09/18/23 9:06 AM Medical Oncology and Hematology [facility] [address] [place], [place] [address] Tel. [phone]    Fax. [phone]  *Total Encounter Time as defined by the Centers for Medicare and Medicaid Services includes, in addition to the face-to-face time of a patient visit (documented in the note above) non-face-to-face time: obtaining and reviewing outside history, ordering and reviewing medications, tests or procedures, care coordination (communications with other health care professionals or caregivers) and documentation in the medical record.

## 2023-09-18 NOTE — Assessment & Plan Note (Signed)
[name] [name] is a 72 year old woman with stage Ia ER/PR positive breast cancer diagnosed in October 2024 status postlumpectomy here to discuss results about recent Oncotype and next plans.  Stage Ia breast cancer: I reviewed her Oncotype results with her and she is at low risk for distant recurrence.  We discussed this in detail.  We reviewed next steps which include adjuvant radiation and antiestrogen therapy.  We discussed anastrozole briefly.  [name] [name] will return as scheduled for radiation and we will see her back in 6 weeks for follow-up with Dr. Al Pimple to discuss antiestrogen therapy further.

## 2023-09-18 NOTE — Progress Notes (Signed)
 Patient is a pleasant 72 year old female s/p removal and replacement of bilateral breast implants with circumferential capsulotomies and mastopexy for symmetry performed 08/12/2023 Dr. Lowery who presents to clinic for postoperative follow-up.  She was last seen here in clinic on 09/01/2023.  At that time, exam was entirely benign and she was pleased with her results.  Bruising and swelling from the mastopexy as expected.  Pictures were obtained and placed in chart.  Today, she is doing okay.  She states that she has not had any pain recently and is eager to resume normal activity and discontinue use of compression bra.  She states that the bruising and swelling has resolved.  Her only concern is an area of firmness over superior medial aspect of right breast.  Otherwise, no complaints.  On exam, breasts have relatively good shape and symmetry.  Right breast with some contour irregularity likely due to the lumpectomies at time of capsulotomies and implant exchange.  No erythema or induration.  Steri-Strips have fallen off for the most part, residual ones removed.  Incisions CDI throughout.  Some residual Dermabond.  Recommending Vaseline or incisions throughout 1-2 times daily for the next week.  Mechanical massage of the area of firmness.  Likely small resolving hematoma versus fat necrosis.  Afterwards, she can transition to silicone scar gels twice daily x 3 months.  Follow-up with Dr. Lowery in 6 to 12 months to discuss contour irregularities and to discuss surgical options.  She can certainly call the office if she having questions or concerns in interim.  Picture(s) obtained of the patient and placed in the chart were with the patient's or guardian's permission.

## 2023-09-19 ENCOUNTER — Encounter: Payer: Self-pay | Admitting: Physician Assistant

## 2023-09-19 ENCOUNTER — Ambulatory Visit (INDEPENDENT_AMBULATORY_CARE_PROVIDER_SITE_OTHER): Payer: Medicare Other | Admitting: Physician Assistant

## 2023-09-19 VITALS — BP 138/73 | HR 67

## 2023-09-19 DIAGNOSIS — C50811 Malignant neoplasm of overlapping sites of right female breast: Secondary | ICD-10-CM

## 2023-09-19 DIAGNOSIS — Z170 Estrogen receptor positive status [ER+]: Secondary | ICD-10-CM

## 2023-09-21 ENCOUNTER — Telehealth: Payer: Self-pay | Admitting: Hematology and Oncology

## 2023-09-21 NOTE — Telephone Encounter (Signed)
 Spoke with patient confirming upcoming appointment

## 2023-09-21 NOTE — Progress Notes (Signed)
 Radiation Oncology         [phone]) [phone] ________________________________  Name: [name] [name]        MRN: [id number]  Date of Service: 09/28/2023 DOB: 22-Jun-1951  CC:Allwardt, Mardy HERO, PA-C  Iruku, Praveena, MD     REFERRING PHYSICIAN: Loretha Ash, MD   DIAGNOSIS: The encounter diagnosis was Malignant neoplasm of overlapping sites of right breast in female, estrogen receptor positive (HCC).     HISTORY OF PRESENT ILLNESS: [name] [name] is a 73 y.o. female originally seen in the multidisciplinary breast clinic for a new diagnosis of right breast cancer. The patient was noted to have a palpable mass in the right breast.  She proceeded with diagnostic workup which confirmed palpable findings on physical exam and mammogram.  She has retropectoral implantsA.  By ultrasound the palpable area of concern was in the 1:00 breast corresponding to an oval mass measuring 7 mm with a few internal cystic spaces.  Adjacent there was a complex circumscribed mass measuring up to 1.6 cm with no vascularity confirming the finding by mammography, the axilla was negative for adenopathy. 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  There were mucinous features of the specimen.  In the adjacent 1:00 specimen 4 cm from the nipple similar morphology of grade 2 invasive mammary carcinoma with mucinous features was noted with low to intermediate grade DCIS.  The tumor was ER/PR positive HER2 negative with a Ki-67 of 10%.  Since her last visit, the patient underwent right lumpectomy x 2 followed by mastopexy and exchange of bilateral in situ breast implants on 08/11/2023 with Dr. Curvin and Dr. Lowery.  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  Associated intermediate grade DCIS was noted as well.  Her margins were  negative, the closest for invasive disease being 1 mm to the anterior margin, and for in situ disease 2 mm to the inferior margin.  Additional inferior medial margin excision showed intermediate grade DCIS with atypical ductal hyperplasia and intraductal papillomas the DCIS being the 2 mm measurement to that specimen to the inferior margin.  Oncotype DX scoring resulted in a score of 14.  She is seen to proceed with adjuvant radiotherapy to the right breast.    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 Edited by Darlyn, MD   GERD (gastroesophageal reflux disease)    Headache(784.0)    Hypercholesterolemia    Hypertension    Idiopathic urticaria    Varicose veins of lower extremities        PAST SURGICAL HISTORY: Past Surgical History:  Procedure Laterality Date   AUGMENTATION MAMMAPLASTY Bilateral    benign breast biopsy     bilateral breast implants     BREAST BIOPSY     unsure which breast and when   BREAST BIOPSY Right 07/03/2023   US  RT BREAST BX W LOC DEV EA ADD LESION IMG BX  SPEC US  GUIDE 07/03/2023 [facility]   BREAST BIOPSY Right 07/03/2023   US  RT BREAST BX W LOC DEV 1ST LESION IMG BX SPEC US  GUIDE 07/03/2023 [facility]   BREAST BIOPSY Right 08/08/2023   US  RT RADIOACTIVE SEED EA ADD LESION 08/08/2023 [facility]   BREAST BIOPSY  08/08/2023   US  RT RADIOACTIVE SEED LOC 08/08/2023 [facility]   BREAST IMPLANT REMOVAL Bilateral 08/11/2023   Procedure: BILATERAL REMOVAL BREAST IMPLANTS;  Surgeon: Lowery Estefana RAMAN, DO;  Location: [facility];  Service: Plastics;  Laterality: Bilateral;   BREAST LUMPECTOMY WITH RADIOACTIVE SEED LOCALIZATION Right 08/11/2023   Procedure: RIGHT BREAST RADIOACTIVE SEED LOCALIZED LUMPECTOMY x2;  Surgeon: Curvin Deward MOULD, MD;  Location: [facility];  Service: General;  Laterality: Right;   cataract right eye w/ lens implant     COLONOSCOPY  03/20/2023   COLONOSCOPY  2012   Dr Jakie, normal   excision of large anal polyp     MASTOPEXY Bilateral 08/11/2023   Procedure: MASTOPEXY;  Surgeon: Lowery Estefana RAMAN, DO;  Location: [facility];  Service: Plastics;  Laterality: Bilateral;   PERIPHERAL VASCULAR CATHETERIZATION N/A 02/11/2016   Procedure: Renal Angiography;  Surgeon: Redell LITTIE Door, MD;  Location: [facility] CV [facility];  Service: Cardiovascular;  Laterality: N/A;   PLACEMENT OF BREAST IMPLANTS Bilateral 08/11/2023   Procedure: PLACEMENT OF SILCONE IMPLANTS;  Surgeon: Lowery Estefana RAMAN, DO;  Location: [facility];  Service: Plastics;  Laterality: Bilateral;   POLYPECTOMY     VAGINAL HYSTERECTOMY     varicose vein       FAMILY HISTORY:  Family History  Problem Relation Age of Onset   Hypertension Mother    Hyperlipidemia Mother    Diabetes Mother    Breast cancer Mother 29   Pulmonary embolism Mother    Deep vein thrombosis Mother    Pancreatic cancer Mother 98   Dementia Father    Leukemia Maternal Aunt    Sickle cell anemia Cousin    Colon cancer Neg Hx    Esophageal cancer Neg Hx    Stomach cancer Neg Hx    Colon polyps Neg Hx    Rectal cancer Neg Hx      SOCIAL HISTORY:  reports that she has never smoked. She has never used smokeless tobacco. She reports that she does not drink alcohol and does not use drugs.  The patient is single and lives in [place].  She used to work for [employer], but is now retired.   ALLERGIES: Patient has no known allergies.   MEDICATIONS:  Current Outpatient  Medications  Medication Sig Dispense Refill   amLODipine  (NORVASC ) 5 MG tablet TAKE 1 TABLET BY MOUTH EVERY DAY 90 tablet 1   aspirin  EC 81 MG tablet Take 81 mg by mouth daily.     BIOTIN PO Take by mouth.     cholecalciferol (VITAMIN D ) 1000 units tablet Take 1,000 Units by mouth daily.     nebivolol  (BYSTOLIC ) 10 MG tablet TAKE 1 TABLET BY MOUTH EVERY DAY 90 tablet 1   rosuvastatin  (CRESTOR ) 10 MG tablet TAKE 1 TABLET BY MOUTH EVERY DAY 90 tablet 3   vitamin E 400 UNIT capsule Take 400 Units by mouth daily.     No current facility-administered medications for this visit.     REVIEW OF SYSTEMS: On review of systems, the patient reports that she is doing well overall. She's had a small area of her  incision that feels sharp in both incision sites.     PHYSICAL EXAM:  Wt Readings from Last 3 Encounters:  09/05/23 139 lb 1.6 oz (63.1 kg)  08/29/23 140 lb (63.5 kg)  08/11/23 140 lb 6.9 oz (63.7 kg)   Temp Readings from Last 3 Encounters:  09/05/23 97.7 F (36.5 C) (Temporal)  08/11/23 (!) 97 F (36.1 C)  07/12/23 97.8 F (36.6 C) (Temporal)   BP Readings from Last 3 Encounters:  09/19/23 138/73  09/05/23 137/62  09/01/23 129/70   Pulse Readings from Last 3 Encounters:  09/19/23 67  09/05/23 75  09/01/23 70    In general this is a well appearing African American female in no acute distress. She's alert and oriented x4 and appropriate throughout the examination. Cardiopulmonary assessment is negative for acute distress and she exhibits normal effort.  Her right surgical scar is well-healed without erythema, separation or drainage. She has a small tag of suture at the areola consistent with suture tag, similar changes are noted in the periareolar left incision. Otherwise these incisions are well healed.    ECOG = 1  0 - Asymptomatic (Fully active, able to carry on all predisease activities without restriction)  1 - Symptomatic but completely ambulatory (Restricted in  physically strenuous activity but ambulatory and able to carry out work of a light or sedentary nature. For example, light housework, office work)  2 - Symptomatic, <50% in bed during the day (Ambulatory and capable of all self care but unable to carry out any work activities. Up and about more than 50% of waking hours)  3 - Symptomatic, >50% in bed, but not bedbound (Capable of only limited self-care, confined to bed or chair 50% or more of waking hours)  4 - Bedbound (Completely disabled. Cannot carry on any self-care. Totally confined to bed or chair)  5 - Death   Raylene MM, Creech RH, Tormey DC, et al. [phone]). Toxicity and response criteria of the [facility]. Am. DOROTHA Bridges. Oncol. 5 (6): 649-55    LABORATORY DATA:  Lab Results  Component Value Date   WBC 5.2 07/12/2023   HGB 12.3 07/12/2023   HCT 36.4 07/12/2023   MCV 88.3 07/12/2023   PLT 272 07/12/2023   Lab Results  Component Value Date   NA 141 07/12/2023   K 3.0 (L) 07/12/2023   CL 103 07/12/2023   CO2 31 07/12/2023   Lab Results  Component Value Date   ALT 23 07/12/2023   AST 30 07/12/2023   ALKPHOS 86 07/12/2023   BILITOT 0.3 07/12/2023      RADIOGRAPHY: No results found.     IMPRESSION/PLAN: 1. Stage IA, [id number], grade 2, ER/PR positive invasive mammary carcinoma of the right breast. Dr. Dewey has reviewed her pathology results.  The patient has been doing well since surgery.  Based on her Oncotype DX results, Dr. Loretha does not recommend systemic chemo.  The patient now ready to proceed with external radiotherapy to the breast  to reduce risks of local recurrence followed by antiestrogen therapy. We discussed the risks, benefits, short, and long term effects of radiotherapy, as well as the curative intent, and the patient is interested in proceeding.  I reviewed the delivery and logistics of radiotherapy and Dr. Dewey recommends  a course of 6 1/2 weeks of radiotherapy given her in situ  implants. Written consent is obtained and placed in the chart, a copy was provided to the patient.  She will simulate today.  She was encouraged to continue neosporin or aquaphor type ointment along the sharp edges of her incision until she begins radiation.      In a visit lasting 45 minutes, greater than 50% of the time was spent face to face discussing the patient's condition, in preparation for the discussion, and coordinating the patient's care.      Donald KYM Husband, [facility]      **Disclaimer: This note was dictated with voice recognition software. Similar sounding words can inadvertently be transcribed and this note may contain transcription errors which may not have been corrected upon publication of note.**

## 2023-09-28 ENCOUNTER — Encounter: Payer: Self-pay | Admitting: Radiation Oncology

## 2023-09-28 ENCOUNTER — Ambulatory Visit
Admission: RE | Admit: 2023-09-28 | Discharge: 2023-09-28 | Disposition: A | Discharge status: Home / Self Care | Payer: Medicare Other | Source: Ambulatory Visit | Attending: Radiation Oncology | Admitting: Radiation Oncology

## 2023-09-28 VITALS — BP 140/72 | HR 71 | Temp 97.7°F | Resp 14 | Ht 62.0 in | Wt 140.8 lb

## 2023-09-28 DIAGNOSIS — Z806 Family history of leukemia: Secondary | ICD-10-CM | POA: Insufficient documentation

## 2023-09-28 DIAGNOSIS — I10 Essential (primary) hypertension: Secondary | ICD-10-CM | POA: Diagnosis not present

## 2023-09-28 DIAGNOSIS — Z7982 Long term (current) use of aspirin: Secondary | ICD-10-CM | POA: Insufficient documentation

## 2023-09-28 DIAGNOSIS — K219 Gastro-esophageal reflux disease without esophagitis: Secondary | ICD-10-CM | POA: Insufficient documentation

## 2023-09-28 DIAGNOSIS — Z510 Encounter for antineoplastic radiation therapy: Secondary | ICD-10-CM | POA: Diagnosis not present

## 2023-09-28 DIAGNOSIS — Z170 Estrogen receptor positive status [ER+]: Secondary | ICD-10-CM | POA: Diagnosis not present

## 2023-09-28 DIAGNOSIS — C50811 Malignant neoplasm of overlapping sites of right female breast: Secondary | ICD-10-CM | POA: Insufficient documentation

## 2023-09-28 DIAGNOSIS — Z860100 Personal history of colon polyps, unspecified: Secondary | ICD-10-CM | POA: Diagnosis not present

## 2023-09-28 DIAGNOSIS — I8393 Asymptomatic varicose veins of bilateral lower extremities: Secondary | ICD-10-CM | POA: Insufficient documentation

## 2023-09-28 DIAGNOSIS — Q272 Other congenital malformations of renal artery: Secondary | ICD-10-CM | POA: Diagnosis not present

## 2023-09-28 DIAGNOSIS — C50211 Malignant neoplasm of upper-inner quadrant of right female breast: Secondary | ICD-10-CM | POA: Diagnosis not present

## 2023-09-28 DIAGNOSIS — Z79899 Other long term (current) drug therapy: Secondary | ICD-10-CM | POA: Diagnosis not present

## 2023-09-28 DIAGNOSIS — E7800 Pure hypercholesterolemia, unspecified: Secondary | ICD-10-CM | POA: Insufficient documentation

## 2023-09-28 NOTE — Progress Notes (Signed)
 Nursing interview for Malignant neoplasm of overlapping sites of right breast in female, estrogen receptor positive (HCC).    Patient identity verified x2.  Patient reports mild irritation at the incision line w/ swelling. Patient denies any other related issues at this time.  Meaningful use complete.  Vitals- BP (!) 140/72 (BP Location: Left Arm, Patient Position: Sitting, Cuff Size: Normal)   Pulse 71   Temp 97.7 F (36.5 C) (Oral)   Resp 14   Ht 5' 2 (1.575 m)   Wt 140 lb 12.8 oz (63.9 kg)   LMP  (LMP Unknown)   SpO2 99%   BMI 25.75 kg/m   This concludes the interaction.  Rosaline Minerva, LPN

## 2023-10-05 ENCOUNTER — Encounter: Payer: Self-pay | Admitting: *Deleted

## 2023-10-05 DIAGNOSIS — C50811 Malignant neoplasm of overlapping sites of right female breast: Secondary | ICD-10-CM

## 2023-10-08 DIAGNOSIS — Z170 Estrogen receptor positive status [ER+]: Secondary | ICD-10-CM | POA: Diagnosis not present

## 2023-10-08 DIAGNOSIS — Z510 Encounter for antineoplastic radiation therapy: Secondary | ICD-10-CM | POA: Diagnosis not present

## 2023-10-08 DIAGNOSIS — C50211 Malignant neoplasm of upper-inner quadrant of right female breast: Secondary | ICD-10-CM | POA: Diagnosis not present

## 2023-10-08 DIAGNOSIS — C50811 Malignant neoplasm of overlapping sites of right female breast: Secondary | ICD-10-CM | POA: Diagnosis not present

## 2023-10-12 ENCOUNTER — Ambulatory Visit
Admission: RE | Admit: 2023-10-12 | Discharge: 2023-10-12 | Disposition: A | Discharge status: Home / Self Care | Payer: Medicare Other | Source: Ambulatory Visit | Attending: Radiation Oncology | Admitting: Radiation Oncology

## 2023-10-12 ENCOUNTER — Other Ambulatory Visit: Payer: Self-pay

## 2023-10-12 DIAGNOSIS — Z510 Encounter for antineoplastic radiation therapy: Secondary | ICD-10-CM | POA: Diagnosis not present

## 2023-10-12 DIAGNOSIS — C50811 Malignant neoplasm of overlapping sites of right female breast: Secondary | ICD-10-CM | POA: Diagnosis not present

## 2023-10-12 DIAGNOSIS — C50211 Malignant neoplasm of upper-inner quadrant of right female breast: Secondary | ICD-10-CM | POA: Diagnosis not present

## 2023-10-12 DIAGNOSIS — Z170 Estrogen receptor positive status [ER+]: Secondary | ICD-10-CM | POA: Diagnosis not present

## 2023-10-12 LAB — RAD ONC ARIA SESSION SUMMARY
Course Elapsed Days: 0
Plan Fractions Treated to Date: 1
Plan Prescribed Dose Per Fraction: 1.8 Gy
Plan Total Fractions Prescribed: 28
Plan Total Prescribed Dose: 50.4 Gy
Reference Point Dosage Given to Date: 1.8 Gy
Reference Point Session Dosage Given: 1.8 Gy
Session Number: 1

## 2023-10-13 ENCOUNTER — Other Ambulatory Visit: Payer: Self-pay

## 2023-10-13 ENCOUNTER — Ambulatory Visit
Admission: RE | Admit: 2023-10-13 | Discharge: 2023-10-13 | Disposition: A | Discharge status: Home / Self Care | Payer: Medicare Other | Source: Ambulatory Visit | Attending: Radiation Oncology

## 2023-10-13 ENCOUNTER — Ambulatory Visit
Admission: RE | Admit: 2023-10-13 | Discharge: 2023-10-13 | Disposition: A | Discharge status: Home / Self Care | Payer: Medicare Other | Source: Ambulatory Visit | Attending: Radiation Oncology | Admitting: Radiation Oncology

## 2023-10-13 DIAGNOSIS — Z170 Estrogen receptor positive status [ER+]: Secondary | ICD-10-CM | POA: Diagnosis not present

## 2023-10-13 DIAGNOSIS — Z510 Encounter for antineoplastic radiation therapy: Secondary | ICD-10-CM | POA: Diagnosis not present

## 2023-10-13 DIAGNOSIS — C50211 Malignant neoplasm of upper-inner quadrant of right female breast: Secondary | ICD-10-CM | POA: Diagnosis not present

## 2023-10-13 DIAGNOSIS — C50811 Malignant neoplasm of overlapping sites of right female breast: Secondary | ICD-10-CM | POA: Diagnosis not present

## 2023-10-13 LAB — RAD ONC ARIA SESSION SUMMARY
Course Elapsed Days: 1
Plan Fractions Treated to Date: 2
Plan Prescribed Dose Per Fraction: 1.8 Gy
Plan Total Fractions Prescribed: 28
Plan Total Prescribed Dose: 50.4 Gy
Reference Point Dosage Given to Date: 3.6 Gy
Reference Point Session Dosage Given: 1.8 Gy
Session Number: 2

## 2023-10-13 MED ORDER — RADIAPLEXRX EX GEL: Freq: Once | CUTANEOUS | Status: AC

## 2023-10-13 MED ORDER — ALRA NON-METALLIC DEODORANT (RAD-ONC)
1.0000 | Freq: Once | TOPICAL | Status: AC
  Administered 2023-10-13: 1 via TOPICAL

## 2023-10-16 ENCOUNTER — Other Ambulatory Visit: Payer: Self-pay

## 2023-10-16 ENCOUNTER — Ambulatory Visit
Admission: RE | Admit: 2023-10-16 | Discharge: 2023-10-16 | Disposition: A | Discharge status: Home / Self Care | Payer: Medicare Other | Source: Ambulatory Visit | Attending: Radiation Oncology | Admitting: Radiation Oncology

## 2023-10-16 DIAGNOSIS — Z510 Encounter for antineoplastic radiation therapy: Secondary | ICD-10-CM | POA: Diagnosis not present

## 2023-10-16 DIAGNOSIS — Z170 Estrogen receptor positive status [ER+]: Secondary | ICD-10-CM | POA: Diagnosis not present

## 2023-10-16 DIAGNOSIS — C50811 Malignant neoplasm of overlapping sites of right female breast: Secondary | ICD-10-CM | POA: Diagnosis not present

## 2023-10-16 DIAGNOSIS — C50211 Malignant neoplasm of upper-inner quadrant of right female breast: Secondary | ICD-10-CM | POA: Diagnosis not present

## 2023-10-16 LAB — RAD ONC ARIA SESSION SUMMARY
Course Elapsed Days: 4
Plan Fractions Treated to Date: 3
Plan Prescribed Dose Per Fraction: 1.8 Gy
Plan Total Fractions Prescribed: 28
Plan Total Prescribed Dose: 50.4 Gy
Reference Point Dosage Given to Date: 5.4 Gy
Reference Point Session Dosage Given: 1.8 Gy
Session Number: 3

## 2023-10-17 ENCOUNTER — Ambulatory Visit
Admission: RE | Admit: 2023-10-17 | Discharge: 2023-10-17 | Disposition: A | Discharge status: Home / Self Care | Payer: Medicare Other | Source: Ambulatory Visit | Attending: Radiation Oncology

## 2023-10-17 ENCOUNTER — Other Ambulatory Visit: Payer: Self-pay

## 2023-10-17 DIAGNOSIS — C50811 Malignant neoplasm of overlapping sites of right female breast: Secondary | ICD-10-CM | POA: Diagnosis not present

## 2023-10-17 DIAGNOSIS — Z510 Encounter for antineoplastic radiation therapy: Secondary | ICD-10-CM | POA: Diagnosis not present

## 2023-10-17 DIAGNOSIS — C50211 Malignant neoplasm of upper-inner quadrant of right female breast: Secondary | ICD-10-CM | POA: Diagnosis not present

## 2023-10-17 DIAGNOSIS — Z170 Estrogen receptor positive status [ER+]: Secondary | ICD-10-CM | POA: Diagnosis not present

## 2023-10-17 LAB — RAD ONC ARIA SESSION SUMMARY
Course Elapsed Days: 5
Plan Fractions Treated to Date: 4
Plan Prescribed Dose Per Fraction: 1.8 Gy
Plan Total Fractions Prescribed: 28
Plan Total Prescribed Dose: 50.4 Gy
Reference Point Dosage Given to Date: 7.2 Gy
Reference Point Session Dosage Given: 1.8 Gy
Session Number: 4

## 2023-10-18 ENCOUNTER — Ambulatory Visit
Admission: RE | Admit: 2023-10-18 | Discharge: 2023-10-18 | Disposition: A | Discharge status: Home / Self Care | Payer: Medicare Other | Source: Ambulatory Visit | Attending: Radiation Oncology | Admitting: Radiation Oncology

## 2023-10-18 ENCOUNTER — Other Ambulatory Visit: Payer: Self-pay

## 2023-10-18 DIAGNOSIS — C50811 Malignant neoplasm of overlapping sites of right female breast: Secondary | ICD-10-CM | POA: Diagnosis not present

## 2023-10-18 DIAGNOSIS — Z510 Encounter for antineoplastic radiation therapy: Secondary | ICD-10-CM | POA: Diagnosis not present

## 2023-10-18 DIAGNOSIS — C50211 Malignant neoplasm of upper-inner quadrant of right female breast: Secondary | ICD-10-CM | POA: Diagnosis not present

## 2023-10-18 DIAGNOSIS — Z170 Estrogen receptor positive status [ER+]: Secondary | ICD-10-CM | POA: Diagnosis not present

## 2023-10-18 LAB — RAD ONC ARIA SESSION SUMMARY
Course Elapsed Days: 6
Plan Fractions Treated to Date: 5
Plan Prescribed Dose Per Fraction: 1.8 Gy
Plan Total Fractions Prescribed: 28
Plan Total Prescribed Dose: 50.4 Gy
Reference Point Dosage Given to Date: 9 Gy
Reference Point Session Dosage Given: 1.8 Gy
Session Number: 5

## 2023-10-19 ENCOUNTER — Ambulatory Visit
Admission: RE | Admit: 2023-10-19 | Discharge: 2023-10-19 | Disposition: A | Discharge status: Home / Self Care | Payer: Medicare Other | Source: Ambulatory Visit | Attending: Radiation Oncology | Admitting: Radiation Oncology

## 2023-10-19 ENCOUNTER — Other Ambulatory Visit: Payer: Self-pay

## 2023-10-19 DIAGNOSIS — Z170 Estrogen receptor positive status [ER+]: Secondary | ICD-10-CM | POA: Diagnosis not present

## 2023-10-19 DIAGNOSIS — C50811 Malignant neoplasm of overlapping sites of right female breast: Secondary | ICD-10-CM | POA: Diagnosis not present

## 2023-10-19 DIAGNOSIS — Z510 Encounter for antineoplastic radiation therapy: Secondary | ICD-10-CM | POA: Diagnosis not present

## 2023-10-19 DIAGNOSIS — C50211 Malignant neoplasm of upper-inner quadrant of right female breast: Secondary | ICD-10-CM | POA: Diagnosis not present

## 2023-10-19 LAB — RAD ONC ARIA SESSION SUMMARY
Course Elapsed Days: 7
Plan Fractions Treated to Date: 6
Plan Prescribed Dose Per Fraction: 1.8 Gy
Plan Total Fractions Prescribed: 28
Plan Total Prescribed Dose: 50.4 Gy
Reference Point Dosage Given to Date: 10.8 Gy
Reference Point Session Dosage Given: 1.8 Gy
Session Number: 6

## 2023-10-20 ENCOUNTER — Telehealth: Payer: Self-pay | Admitting: Physician Assistant

## 2023-10-20 ENCOUNTER — Other Ambulatory Visit: Payer: Self-pay

## 2023-10-20 ENCOUNTER — Ambulatory Visit
Admission: RE | Admit: 2023-10-20 | Discharge: 2023-10-20 | Disposition: A | Discharge status: Home / Self Care | Payer: Medicare Other | Source: Ambulatory Visit | Attending: Radiation Oncology

## 2023-10-20 DIAGNOSIS — C50811 Malignant neoplasm of overlapping sites of right female breast: Secondary | ICD-10-CM | POA: Diagnosis not present

## 2023-10-20 DIAGNOSIS — C50211 Malignant neoplasm of upper-inner quadrant of right female breast: Secondary | ICD-10-CM | POA: Diagnosis not present

## 2023-10-20 DIAGNOSIS — Z510 Encounter for antineoplastic radiation therapy: Secondary | ICD-10-CM | POA: Diagnosis not present

## 2023-10-20 DIAGNOSIS — Z170 Estrogen receptor positive status [ER+]: Secondary | ICD-10-CM | POA: Diagnosis not present

## 2023-10-20 LAB — RAD ONC ARIA SESSION SUMMARY
Course Elapsed Days: 8
Plan Fractions Treated to Date: 7
Plan Prescribed Dose Per Fraction: 1.8 Gy
Plan Total Fractions Prescribed: 28
Plan Total Prescribed Dose: 50.4 Gy
Reference Point Dosage Given to Date: 12.6 Gy
Reference Point Session Dosage Given: 1.8 Gy
Session Number: 7

## 2023-10-20 NOTE — Telephone Encounter (Unsigned)
Copied from CRM [id number]. Topic: Appointments - Scheduling Inquiry for Clinic >> Oct 20, 2023  3:11 PM Denese Killings wrote: Reason for CRM: Patient wants to schedule for a Tetanus shot.

## 2023-10-23 ENCOUNTER — Ambulatory Visit
Admission: RE | Admit: 2023-10-23 | Discharge: 2023-10-23 | Disposition: A | Discharge status: Home / Self Care | Payer: Medicare Other | Source: Ambulatory Visit | Attending: Radiation Oncology | Admitting: Radiation Oncology

## 2023-10-23 ENCOUNTER — Other Ambulatory Visit: Payer: Self-pay

## 2023-10-23 DIAGNOSIS — C50811 Malignant neoplasm of overlapping sites of right female breast: Secondary | ICD-10-CM | POA: Diagnosis not present

## 2023-10-23 DIAGNOSIS — Z510 Encounter for antineoplastic radiation therapy: Secondary | ICD-10-CM | POA: Insufficient documentation

## 2023-10-23 DIAGNOSIS — Z170 Estrogen receptor positive status [ER+]: Secondary | ICD-10-CM | POA: Diagnosis not present

## 2023-10-23 DIAGNOSIS — C50211 Malignant neoplasm of upper-inner quadrant of right female breast: Secondary | ICD-10-CM | POA: Diagnosis not present

## 2023-10-23 LAB — RAD ONC ARIA SESSION SUMMARY
Course Elapsed Days: 11
Plan Fractions Treated to Date: 8
Plan Prescribed Dose Per Fraction: 1.8 Gy
Plan Total Fractions Prescribed: 28
Plan Total Prescribed Dose: 50.4 Gy
Reference Point Dosage Given to Date: 14.4 Gy
Reference Point Session Dosage Given: 1.8 Gy
Session Number: 8

## 2023-10-23 NOTE — Telephone Encounter (Signed)
Please contact patient and advise medicare requires this be given at pharmacy and will not pay in office, no appt needed

## 2023-10-24 ENCOUNTER — Other Ambulatory Visit: Payer: Self-pay

## 2023-10-24 ENCOUNTER — Ambulatory Visit
Admission: RE | Admit: 2023-10-24 | Discharge: 2023-10-24 | Disposition: A | Discharge status: Home / Self Care | Payer: Medicare Other | Source: Ambulatory Visit | Attending: Radiation Oncology | Admitting: Radiation Oncology

## 2023-10-24 DIAGNOSIS — C50811 Malignant neoplasm of overlapping sites of right female breast: Secondary | ICD-10-CM | POA: Diagnosis not present

## 2023-10-24 DIAGNOSIS — Z170 Estrogen receptor positive status [ER+]: Secondary | ICD-10-CM | POA: Diagnosis not present

## 2023-10-24 DIAGNOSIS — Z510 Encounter for antineoplastic radiation therapy: Secondary | ICD-10-CM | POA: Diagnosis not present

## 2023-10-24 DIAGNOSIS — C50211 Malignant neoplasm of upper-inner quadrant of right female breast: Secondary | ICD-10-CM | POA: Diagnosis not present

## 2023-10-24 LAB — RAD ONC ARIA SESSION SUMMARY
Course Elapsed Days: 12
Plan Fractions Treated to Date: 9
Plan Prescribed Dose Per Fraction: 1.8 Gy
Plan Total Fractions Prescribed: 28
Plan Total Prescribed Dose: 50.4 Gy
Reference Point Dosage Given to Date: 16.2 Gy
Reference Point Session Dosage Given: 1.8 Gy
Session Number: 9

## 2023-10-25 ENCOUNTER — Other Ambulatory Visit: Payer: Self-pay

## 2023-10-25 ENCOUNTER — Ambulatory Visit
Admission: RE | Admit: 2023-10-25 | Discharge: 2023-10-25 | Disposition: A | Discharge status: Home / Self Care | Payer: Medicare Other | Source: Ambulatory Visit | Attending: Radiation Oncology

## 2023-10-25 DIAGNOSIS — Z170 Estrogen receptor positive status [ER+]: Secondary | ICD-10-CM | POA: Diagnosis not present

## 2023-10-25 DIAGNOSIS — C50211 Malignant neoplasm of upper-inner quadrant of right female breast: Secondary | ICD-10-CM | POA: Diagnosis not present

## 2023-10-25 DIAGNOSIS — C50811 Malignant neoplasm of overlapping sites of right female breast: Secondary | ICD-10-CM | POA: Diagnosis not present

## 2023-10-25 DIAGNOSIS — Z510 Encounter for antineoplastic radiation therapy: Secondary | ICD-10-CM | POA: Diagnosis not present

## 2023-10-25 LAB — RAD ONC ARIA SESSION SUMMARY
Course Elapsed Days: 13
Plan Fractions Treated to Date: 10
Plan Prescribed Dose Per Fraction: 1.8 Gy
Plan Total Fractions Prescribed: 28
Plan Total Prescribed Dose: 50.4 Gy
Reference Point Dosage Given to Date: 18 Gy
Reference Point Session Dosage Given: 1.8 Gy
Session Number: 10

## 2023-10-26 ENCOUNTER — Other Ambulatory Visit: Payer: Self-pay

## 2023-10-26 ENCOUNTER — Ambulatory Visit
Admission: RE | Admit: 2023-10-26 | Discharge: 2023-10-26 | Disposition: A | Discharge status: Home / Self Care | Payer: Medicare Other | Source: Ambulatory Visit | Attending: Radiation Oncology | Admitting: Radiation Oncology

## 2023-10-26 DIAGNOSIS — C50211 Malignant neoplasm of upper-inner quadrant of right female breast: Secondary | ICD-10-CM | POA: Diagnosis not present

## 2023-10-26 DIAGNOSIS — Z170 Estrogen receptor positive status [ER+]: Secondary | ICD-10-CM | POA: Diagnosis not present

## 2023-10-26 DIAGNOSIS — Z510 Encounter for antineoplastic radiation therapy: Secondary | ICD-10-CM | POA: Diagnosis not present

## 2023-10-26 DIAGNOSIS — C50811 Malignant neoplasm of overlapping sites of right female breast: Secondary | ICD-10-CM | POA: Diagnosis not present

## 2023-10-26 LAB — RAD ONC ARIA SESSION SUMMARY
Course Elapsed Days: 14
Plan Fractions Treated to Date: 11
Plan Prescribed Dose Per Fraction: 1.8 Gy
Plan Total Fractions Prescribed: 28
Plan Total Prescribed Dose: 50.4 Gy
Reference Point Dosage Given to Date: 19.8 Gy
Reference Point Session Dosage Given: 1.8 Gy
Session Number: 11

## 2023-10-27 ENCOUNTER — Other Ambulatory Visit: Payer: Self-pay

## 2023-10-27 ENCOUNTER — Ambulatory Visit
Admission: RE | Admit: 2023-10-27 | Discharge: 2023-10-27 | Disposition: A | Discharge status: Home / Self Care | Payer: Medicare Other | Source: Ambulatory Visit | Attending: Radiation Oncology | Admitting: Radiation Oncology

## 2023-10-27 ENCOUNTER — Ambulatory Visit
Admission: RE | Admit: 2023-10-27 | Discharge: 2023-10-27 | Disposition: A | Discharge status: Home / Self Care | Payer: Medicare Other | Source: Ambulatory Visit | Attending: Radiation Oncology

## 2023-10-27 DIAGNOSIS — C50211 Malignant neoplasm of upper-inner quadrant of right female breast: Secondary | ICD-10-CM | POA: Diagnosis not present

## 2023-10-27 DIAGNOSIS — C50811 Malignant neoplasm of overlapping sites of right female breast: Secondary | ICD-10-CM | POA: Diagnosis not present

## 2023-10-27 DIAGNOSIS — Z510 Encounter for antineoplastic radiation therapy: Secondary | ICD-10-CM | POA: Diagnosis not present

## 2023-10-27 DIAGNOSIS — Z170 Estrogen receptor positive status [ER+]: Secondary | ICD-10-CM | POA: Diagnosis not present

## 2023-10-27 LAB — RAD ONC ARIA SESSION SUMMARY
Course Elapsed Days: 15
Plan Fractions Treated to Date: 12
Plan Prescribed Dose Per Fraction: 1.8 Gy
Plan Total Fractions Prescribed: 28
Plan Total Prescribed Dose: 50.4 Gy
Reference Point Dosage Given to Date: 21.6 Gy
Reference Point Session Dosage Given: 1.8 Gy
Session Number: 12

## 2023-10-30 ENCOUNTER — Ambulatory Visit
Admission: RE | Admit: 2023-10-30 | Discharge: 2023-10-30 | Disposition: A | Discharge status: Home / Self Care | Payer: Medicare Other | Source: Ambulatory Visit | Attending: Radiation Oncology | Admitting: Radiation Oncology

## 2023-10-30 ENCOUNTER — Other Ambulatory Visit: Payer: Self-pay

## 2023-10-30 DIAGNOSIS — Z510 Encounter for antineoplastic radiation therapy: Secondary | ICD-10-CM | POA: Diagnosis not present

## 2023-10-30 DIAGNOSIS — C50211 Malignant neoplasm of upper-inner quadrant of right female breast: Secondary | ICD-10-CM | POA: Diagnosis not present

## 2023-10-30 DIAGNOSIS — Z170 Estrogen receptor positive status [ER+]: Secondary | ICD-10-CM | POA: Diagnosis not present

## 2023-10-30 DIAGNOSIS — C50811 Malignant neoplasm of overlapping sites of right female breast: Secondary | ICD-10-CM | POA: Diagnosis not present

## 2023-10-30 LAB — RAD ONC ARIA SESSION SUMMARY
Course Elapsed Days: 18
Plan Fractions Treated to Date: 13
Plan Prescribed Dose Per Fraction: 1.8 Gy
Plan Total Fractions Prescribed: 28
Plan Total Prescribed Dose: 50.4 Gy
Reference Point Dosage Given to Date: 23.4 Gy
Reference Point Session Dosage Given: 1.8 Gy
Session Number: 13

## 2023-10-31 ENCOUNTER — Inpatient Hospital Stay (HOSPITAL_BASED_OUTPATIENT_CLINIC_OR_DEPARTMENT_OTHER): Payer: Medicare Other | Attending: Hematology and Oncology | Admitting: Hematology and Oncology

## 2023-10-31 ENCOUNTER — Ambulatory Visit
Admission: RE | Admit: 2023-10-31 | Discharge: 2023-10-31 | Discharge status: Home / Self Care | Payer: Medicare Other | Source: Ambulatory Visit | Attending: Radiation Oncology

## 2023-10-31 ENCOUNTER — Encounter: Payer: Self-pay | Admitting: Hematology and Oncology

## 2023-10-31 ENCOUNTER — Other Ambulatory Visit: Payer: Self-pay

## 2023-10-31 VITALS — BP 142/65 | HR 73 | Temp 97.6°F | Resp 16 | Wt 138.7 lb

## 2023-10-31 DIAGNOSIS — Z780 Asymptomatic menopausal state: Secondary | ICD-10-CM | POA: Diagnosis not present

## 2023-10-31 DIAGNOSIS — C50811 Malignant neoplasm of overlapping sites of right female breast: Secondary | ICD-10-CM | POA: Diagnosis not present

## 2023-10-31 DIAGNOSIS — Z1732 Human epidermal growth factor receptor 2 negative status: Secondary | ICD-10-CM | POA: Insufficient documentation

## 2023-10-31 DIAGNOSIS — C50211 Malignant neoplasm of upper-inner quadrant of right female breast: Secondary | ICD-10-CM | POA: Diagnosis not present

## 2023-10-31 DIAGNOSIS — Z510 Encounter for antineoplastic radiation therapy: Secondary | ICD-10-CM | POA: Diagnosis not present

## 2023-10-31 DIAGNOSIS — Z13820 Encounter for screening for osteoporosis: Secondary | ICD-10-CM | POA: Diagnosis not present

## 2023-10-31 DIAGNOSIS — Z923 Personal history of irradiation: Secondary | ICD-10-CM | POA: Insufficient documentation

## 2023-10-31 DIAGNOSIS — Z170 Estrogen receptor positive status [ER+]: Secondary | ICD-10-CM | POA: Diagnosis not present

## 2023-10-31 LAB — RAD ONC ARIA SESSION SUMMARY
Course Elapsed Days: 19
Plan Fractions Treated to Date: 14
Plan Prescribed Dose Per Fraction: 1.8 Gy
Plan Total Fractions Prescribed: 28
Plan Total Prescribed Dose: 50.4 Gy
Reference Point Dosage Given to Date: 25.2 Gy
Reference Point Session Dosage Given: 1.8 Gy
Session Number: 14

## 2023-10-31 MED ORDER — ANASTROZOLE 1 MG PO TABS: 1.0000 mg | ORAL_TABLET | Freq: Every day | ORAL | 3 refills | Status: AC

## 2023-10-31 NOTE — Progress Notes (Signed)
[name] Cancer Follow up:    [name], [name] Infante, [name] [name] [address] [place] [address]   DIAGNOSIS:  Cancer Staging  Malignant neoplasm of overlapping sites of right breast in female, estrogen receptor positive (HCC) Staging form: Breast, AJCC 8th Edition - Clinical: Stage IA (cT1c, cN0, cM0, G2, ER+, PR+, HER2-) - Signed by Ronny Bacon, [name] [name] on 07/10/2023 Method of lymph node assessment: Clinical Histologic grading system: 3 grade system - Pathologic stage from 08/11/2023: Stage IA (pT1c, pN0, cM0, G2, ER+, PR+, HER2-) - Signed by Loa Socks, NP on 09/18/2023 Stage prefix: Initial diagnosis Histologic grading system: 3 grade system   SUMMARY OF ONCOLOGIC HISTORY: Oncology History  Malignant neoplasm of overlapping sites of right breast in female, estrogen receptor positive (HCC)  06/29/2023 Mammogram   Mammogram October 2024 showed right breast mass at 1:00 at the palpable site of concern 4 cm from the nipple measuring 7 x 5 x 6 mm.  Adjacent to this is a complex circumscribed mass measuring 1.6 x 1.1 x 1.2 cm.  Ultrasound of the right axilla demonstrated normal lymph nodes.   07/03/2023 Pathology Results   Right breast needle core biopsy showed invasive mammary carcinoma with mucinous features, DCIS with cribriform pattern and intermediate grade, overall grade of the invasive, (2, prognostics showed ER 9089% strong staining, PR 99% positive strong staining, HER2 0 and Ki-67 of 10%   07/10/2023 Initial Diagnosis   Malignant neoplasm of upper-inner quadrant of right breast in female, estrogen receptor positive (HCC)   07/10/2023 Cancer Staging   Staging form: Breast, AJCC 8th Edition - Clinical: Stage IA (cT1c, cN0, cM0, G2, ER+, PR+, HER2-) - Signed by Ronny Bacon, [name] [name] on 07/10/2023 Method of lymph node assessment: Clinical Histologic grading system: 3 grade system    Genetic Testing   [facility] CancerNext Panel+RNA was  Negative. Report date is 07/27/2023.   The CancerNext gene panel offered by [employer] includes sequencing, rearrangement analysis, and RNA analysis for the following 34 genes:   APC, ATM, AXIN2, BARD1, BMPR1A, BRCA1, BRCA2, BRIP1, CDH1, CDK4, CDKN2A, CHEK2, DICER1, HOXB13, EPCAM, GREM1, MLH1, MSH2, MSH3, MSH6, MUTYH, NF1, NTHL1, PALB2, PMS2, POLD1, POLE, PTEN, RAD51C, RAD51D, SMAD4, SMARCA4, STK11, and TP53.    08/11/2023 Definitive Surgery   A. BREAST, RIGHT W/SEEDS, LUMPECTOMY:  Invasive ductal carcinoma with extracellular mucin, two tumors, 1.9 and  0.5 cm, grade 2  Ductal carcinoma in situ: Cribriform, intermediate nuclear grade  Margins, invasive:- Negative      Closest, invasive: 1 mm, anterior (coil clip)  Margins, DCIS: Negative      Closest, DCIS: 2 mm, inferior  Lymphovascular invasion: Not identified  Prognostic markers:  ER positive, PR positive, Her2 negative, Ki-67 10%  Other: Atypical ductal hyperplasia and intraductal papillomas  See oncology table   B. BREAST, RIGHT ADDITIONAL INFERIOR MEDIAL MARGIN, EXCISION:  Ductal carcinoma in situ, intermediate grade  Atypical ductal hyperplasia and intraductal papillomas  DCIS 2 mm from inferior margin     08/11/2023 Oncotype testing   14/4%, <1% chemo benefit   08/11/2023 Cancer Staging   Staging form: Breast, AJCC 8th Edition - Pathologic stage from 08/11/2023: Stage IA (pT1c, pN0, cM0, G2, ER+, PR+, HER2-) - Signed by Loa Socks, NP on 09/18/2023 Stage prefix: Initial diagnosis Histologic grading system: 3 grade system    Discussed the use of AI scribe software for clinical note transcription with the patient, who gave verbal consent to proceed.  History of Present Illness  [name] [name] is a 73 year old female with breast cancer who presents for a follow-up regarding anti-estrogen therapy post-radiation.  She is currently undergoing radiation therapy for breast cancer and is nearing the end of  her treatment, with approximately three weeks remaining.  She has been informed about two anti-estrogen medications, anastrozole and tamoxifen. She experiences menopausal symptoms such as hot flashes, which she associates with milk consumption, particularly at night. She was on estrogen therapy until her cancer diagnosis, which may have masked menopausal symptoms.  She last had a bone density scan in 2021, which was normal.  She mentions issues with sutures from a previous plastic surgery, noting that some sutures are not dissolving as expected, causing discomfort.  Rest of the pertinent 10 point ROS reviewed and neg.  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 Edited by Renold Don, MD   GERD (gastroesophageal reflux disease)    Headache(784.0)    Hypercholesterolemia    Hypertension    Idiopathic  urticaria    Varicose veins of lower extremities     SURGICAL HISTORY: Past Surgical History:  Procedure Laterality Date   AUGMENTATION MAMMAPLASTY Bilateral    benign breast biopsy     bilateral breast implants     BREAST BIOPSY     unsure which breast and when   BREAST BIOPSY Right 07/03/2023   [place] RT BREAST BX W LOC DEV EA ADD LESION IMG BX SPEC US GUIDE 07/03/2023 [facility]   BREAST BIOPSY Right 07/03/2023   [place] RT BREAST BX W LOC DEV 1ST LESION IMG BX SPEC US GUIDE 07/03/2023 [facility]   BREAST BIOPSY Right 08/08/2023   [place] RT RADIOACTIVE SEED EA ADD LESION 08/08/2023 [facility]   BREAST BIOPSY  08/08/2023   [place] RT RADIOACTIVE SEED LOC 08/08/2023 [facility]   BREAST IMPLANT REMOVAL Bilateral 08/11/2023   Procedure: BILATERAL REMOVAL BREAST IMPLANTS;  Surgeon: Peggye Form, DO;  Location: [facility];  Service: Plastics;  Laterality: Bilateral;   BREAST LUMPECTOMY WITH RADIOACTIVE SEED LOCALIZATION Right 08/11/2023   Procedure: RIGHT BREAST RADIOACTIVE SEED LOCALIZED LUMPECTOMY x2;  Surgeon: Griselda Miner, MD;  Location: [facility];  Service: General;  Laterality: Right;   cataract right eye w/ lens implant     COLONOSCOPY  03/20/2023   COLONOSCOPY  2012   Dr Jarold Motto, normal   excision of large anal polyp  MASTOPEXY Bilateral 08/11/2023   Procedure: MASTOPEXY;  Surgeon: Peggye Form, DO;  Location: [facility];  Service: Plastics;  Laterality: Bilateral;   PERIPHERAL VASCULAR CATHETERIZATION N/A 02/11/2016   Procedure: Renal Angiography;  Surgeon: Fransisco Hertz, MD;  [facility]: [facility];  Service: Cardiovascular;  Laterality: N/A;   PLACEMENT OF BREAST IMPLANTS Bilateral 08/11/2023   Procedure: PLACEMENT OF SILCONE IMPLANTS;  Surgeon: Peggye Form, DO;  Location: [facility];  Service: Plastics;  Laterality: Bilateral;   POLYPECTOMY     VAGINAL  HYSTERECTOMY     varicose vein      SOCIAL HISTORY: Social History   Socioeconomic History   Marital status: Single    Spouse name: Not on file   Number of children: 0   Years of education: 12   Highest education level: Not on file  Occupational History    Comment: [employer]  Tobacco Use   Smoking status: Never   Smokeless tobacco: Never  Vaping Use   Vaping status: Never Used  Substance and Sexual Activity   Alcohol use: No    Alcohol/week: 0.0 standard drinks of alcohol   Drug use: No   Sexual activity: Not on file  Other Topics Concern   Not on file  Social History Narrative   Lives alone   No caffeine use   Social Drivers of Corporate investment banker Strain: Low Risk  (08/29/2023)   Overall Financial Resource Strain (CARDIA)    Difficulty of Paying Living Expenses: Not hard at all  Food Insecurity: No Food Insecurity (08/29/2023)   Hunger Vital Sign    Worried About Running Out of Food in the Last Year: Never true    Ran Out of Food in the Last Year: Never true  Transportation Needs: No Transportation Needs (08/29/2023)   PRAPARE - Administrator, Civil Service (Medical): No    Lack of Transportation (Non-Medical): No  Physical Activity: Inactive (08/29/2023)   Exercise Vital Sign    Days of Exercise per Week: 0 days    Minutes of Exercise per Session: 0 min  Stress: No Stress Concern Present (08/29/2023)   [employer] of Occupational Health - Occupational Stress Questionnaire    Feeling of Stress : Not at all  Social Connections: Socially Isolated (08/29/2023)   Social Connection and Isolation Panel [NHANES]    Frequency of Communication with Friends and Family: More than three times a week    Frequency of Social Gatherings with Friends and Family: Once a week    Attends Religious Services: Never    Database administrator or Organizations: No    Attends Banker Meetings: Never    Marital Status: Never married  Intimate Partner  Violence: Not At Risk (08/29/2023)   Humiliation, Afraid, Rape, and Kick questionnaire    Fear of Current or Ex-Partner: No    Emotionally Abused: No    Physically Abused: No    Sexually Abused: No    FAMILY HISTORY: Family History  Problem Relation Age of Onset   Hypertension Mother    Hyperlipidemia Mother    Diabetes Mother    Breast cancer Mother 26   Pulmonary embolism Mother    Deep vein thrombosis Mother    Pancreatic cancer Mother 59   Dementia Father    Leukemia Maternal Aunt    Sickle cell anemia Cousin    Colon cancer Neg Hx    Esophageal cancer Neg Hx  Stomach cancer Neg Hx    Colon polyps Neg Hx    Rectal cancer Neg Hx     Review of Systems  Constitutional:  Negative for appetite change, chills, fatigue, fever and unexpected weight change.  HENT:   Negative for hearing loss, lump/mass and trouble swallowing.   Eyes:  Negative for eye problems and icterus.  Respiratory:  Negative for chest tightness, cough and shortness of breath.   Cardiovascular:  Negative for chest pain, leg swelling and palpitations.  Gastrointestinal:  Negative for abdominal distention, abdominal pain, constipation, diarrhea, nausea and vomiting.  Endocrine: Negative for hot flashes.  Genitourinary:  Negative for difficulty urinating.   Musculoskeletal:  Negative for arthralgias.  Skin:  Negative for itching and rash.  Neurological:  Negative for dizziness, extremity weakness, headaches and numbness.  Hematological:  Negative for adenopathy. Does not bruise/bleed easily.  Psychiatric/Behavioral:  Negative for depression. The patient is not nervous/anxious.       PHYSICAL EXAMINATION  Both breasts healing well, what she feels appear to be sutures.    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  Breast Cancer   Discussed the use of anti-estrogen therapy (Anastrozole or Tamoxifen) post-radiation to reduce the risk of recurrence. The patient has been on estrogen therapy until the diagnosis of cancer.  We have discussed about the mechanism of action of tamoxifen vs aromatase inhibitors, adverse effects with each class. -Start Anastrozole from April 1st, 2025, after the completion of radiation therapy.   -Check-in every six months to monitor the patient's response and manage side effects.    Post-Surgical Care   Patient reports sutures from recent plastic surgery causing discomfort.   -Advise patient to contact the plastic surgeon for suture removal or confirmation if this is absorbable sutures.    Bone Health   Last bone density scan was in 2021 and the results were good. Anti-estrogen therapy can lower bone density over time.   -Order a bone density scan as it is due for the patient.    Follow-up   Survivorship visit scheduled with the nurse practitioner on May 6th, 2025. Another follow-up with Dr. Ulice Bold in the summer.   -Next oncology follow-up in fall (September/October) 2025.    The patient was provided an opportunity to ask questions and all were answered. The patient agreed with the plan and demonstrated an understanding of the instructions.    Rachel Moulds MD

## 2023-10-31 NOTE — Assessment & Plan Note (Addendum)
[name] [name] is a 73 year old woman with stage Ia ER/PR positive breast cancer diagnosed in October 2024 status postlumpectomy  and adj radiation who is here for follow up.  Breast Cancer   Discussed the use of anti-estrogen therapy (Anastrozole or Tamoxifen) post-radiation to reduce the risk of recurrence. The patient has been on estrogen therapy until the diagnosis of cancer.  We have discussed about the mechanism of action of tamoxifen vs aromatase inhibitors, adverse effects with each class. -Start Anastrozole from April 1st, 2025, after the completion of radiation therapy.   -Check-in every six months to monitor the patient's response and manage side effects.    Post-Surgical Care   Patient reports sutures from recent plastic surgery causing discomfort.   -Advise patient to contact the plastic surgeon for suture removal or confirmation if this is absorbable sutures.    Bone Health   Last bone density scan was in 2021 and the results were good. Anti-estrogen therapy can lower bone density over time.   -Order a bone density scan as it is due for the patient.    Follow-up   Survivorship visit scheduled with the nurse practitioner on May 6th, 2025. Another follow-up with Dr. Ulice Bold in the summer.   -Next oncology follow-up in fall (September/October) 2025.

## 2023-11-01 ENCOUNTER — Ambulatory Visit
Admission: RE | Admit: 2023-11-01 | Discharge: 2023-11-01 | Disposition: A | Discharge status: Home / Self Care | Payer: Medicare Other | Source: Ambulatory Visit | Attending: Radiation Oncology | Admitting: Radiation Oncology

## 2023-11-01 ENCOUNTER — Other Ambulatory Visit: Payer: Self-pay

## 2023-11-01 DIAGNOSIS — Z510 Encounter for antineoplastic radiation therapy: Secondary | ICD-10-CM | POA: Diagnosis not present

## 2023-11-01 DIAGNOSIS — C50811 Malignant neoplasm of overlapping sites of right female breast: Secondary | ICD-10-CM | POA: Diagnosis not present

## 2023-11-01 DIAGNOSIS — Z170 Estrogen receptor positive status [ER+]: Secondary | ICD-10-CM | POA: Diagnosis not present

## 2023-11-01 DIAGNOSIS — C50211 Malignant neoplasm of upper-inner quadrant of right female breast: Secondary | ICD-10-CM | POA: Diagnosis not present

## 2023-11-01 LAB — RAD ONC ARIA SESSION SUMMARY
Course Elapsed Days: 20
Plan Fractions Treated to Date: 15
Plan Prescribed Dose Per Fraction: 1.8 Gy
Plan Total Fractions Prescribed: 28
Plan Total Prescribed Dose: 50.4 Gy
Reference Point Dosage Given to Date: 27 Gy
Reference Point Session Dosage Given: 1.8 Gy
Session Number: 15

## 2023-11-02 ENCOUNTER — Telehealth: Payer: Self-pay | Admitting: Hematology and Oncology

## 2023-11-02 ENCOUNTER — Ambulatory Visit
Admission: RE | Admit: 2023-11-02 | Discharge: 2023-11-02 | Discharge status: Home / Self Care | Payer: Medicare Other | Source: Ambulatory Visit | Attending: Radiation Oncology

## 2023-11-02 ENCOUNTER — Ambulatory Visit
Admission: RE | Admit: 2023-11-02 | Discharge: 2023-11-02 | Disposition: A | Discharge status: Home / Self Care | Payer: Medicare Other | Source: Ambulatory Visit | Attending: Radiation Oncology

## 2023-11-02 ENCOUNTER — Other Ambulatory Visit: Payer: Self-pay

## 2023-11-02 DIAGNOSIS — Z510 Encounter for antineoplastic radiation therapy: Secondary | ICD-10-CM | POA: Diagnosis not present

## 2023-11-02 DIAGNOSIS — Z170 Estrogen receptor positive status [ER+]: Secondary | ICD-10-CM | POA: Diagnosis not present

## 2023-11-02 DIAGNOSIS — C50211 Malignant neoplasm of upper-inner quadrant of right female breast: Secondary | ICD-10-CM | POA: Diagnosis not present

## 2023-11-02 DIAGNOSIS — C50811 Malignant neoplasm of overlapping sites of right female breast: Secondary | ICD-10-CM | POA: Diagnosis not present

## 2023-11-02 LAB — RAD ONC ARIA SESSION SUMMARY
Course Elapsed Days: 21
Plan Fractions Treated to Date: 16
Plan Prescribed Dose Per Fraction: 1.8 Gy
Plan Total Fractions Prescribed: 28
Plan Total Prescribed Dose: 50.4 Gy
Reference Point Dosage Given to Date: 28.8 Gy
Reference Point Session Dosage Given: 1.8 Gy
Session Number: 16

## 2023-11-02 NOTE — Telephone Encounter (Signed)
Spoke with patient confirming upcoming appointment

## 2023-11-03 ENCOUNTER — Ambulatory Visit
Admission: RE | Admit: 2023-11-03 | Discharge: 2023-11-03 | Disposition: A | Discharge status: Home / Self Care | Payer: Medicare Other | Source: Ambulatory Visit | Attending: Radiation Oncology | Admitting: Radiation Oncology

## 2023-11-03 ENCOUNTER — Other Ambulatory Visit: Payer: Self-pay

## 2023-11-03 DIAGNOSIS — Z510 Encounter for antineoplastic radiation therapy: Secondary | ICD-10-CM | POA: Diagnosis not present

## 2023-11-03 DIAGNOSIS — C50811 Malignant neoplasm of overlapping sites of right female breast: Secondary | ICD-10-CM | POA: Diagnosis not present

## 2023-11-03 LAB — RAD ONC ARIA SESSION SUMMARY
Course Elapsed Days: 22
Plan Fractions Treated to Date: 17
Plan Prescribed Dose Per Fraction: 1.8 Gy
Plan Total Fractions Prescribed: 28
Plan Total Prescribed Dose: 50.4 Gy
Reference Point Dosage Given to Date: 30.6 Gy
Reference Point Session Dosage Given: 1.8 Gy
Session Number: 17

## 2023-11-06 ENCOUNTER — Ambulatory Visit
Admission: RE | Admit: 2023-11-06 | Discharge: 2023-11-06 | Disposition: A | Discharge status: Home / Self Care | Payer: Medicare Other | Source: Ambulatory Visit | Attending: Radiation Oncology

## 2023-11-06 ENCOUNTER — Other Ambulatory Visit: Payer: Self-pay

## 2023-11-06 DIAGNOSIS — Z510 Encounter for antineoplastic radiation therapy: Secondary | ICD-10-CM | POA: Diagnosis not present

## 2023-11-06 DIAGNOSIS — Z170 Estrogen receptor positive status [ER+]: Secondary | ICD-10-CM | POA: Diagnosis not present

## 2023-11-06 DIAGNOSIS — C50811 Malignant neoplasm of overlapping sites of right female breast: Secondary | ICD-10-CM | POA: Diagnosis not present

## 2023-11-06 DIAGNOSIS — C50211 Malignant neoplasm of upper-inner quadrant of right female breast: Secondary | ICD-10-CM | POA: Diagnosis not present

## 2023-11-06 LAB — RAD ONC ARIA SESSION SUMMARY
Course Elapsed Days: 25
Plan Fractions Treated to Date: 18
Plan Prescribed Dose Per Fraction: 1.8 Gy
Plan Total Fractions Prescribed: 28
Plan Total Prescribed Dose: 50.4 Gy
Reference Point Dosage Given to Date: 32.4 Gy
Reference Point Session Dosage Given: 1.8 Gy
Session Number: 18

## 2023-11-07 ENCOUNTER — Other Ambulatory Visit: Payer: Self-pay

## 2023-11-07 ENCOUNTER — Ambulatory Visit
Admission: RE | Admit: 2023-11-07 | Discharge: 2023-11-07 | Disposition: A | Discharge status: Home / Self Care | Payer: Medicare Other | Source: Ambulatory Visit | Attending: Radiation Oncology

## 2023-11-07 DIAGNOSIS — Z170 Estrogen receptor positive status [ER+]: Secondary | ICD-10-CM | POA: Diagnosis not present

## 2023-11-07 DIAGNOSIS — C50211 Malignant neoplasm of upper-inner quadrant of right female breast: Secondary | ICD-10-CM | POA: Diagnosis not present

## 2023-11-07 DIAGNOSIS — Z510 Encounter for antineoplastic radiation therapy: Secondary | ICD-10-CM | POA: Diagnosis not present

## 2023-11-07 DIAGNOSIS — C50811 Malignant neoplasm of overlapping sites of right female breast: Secondary | ICD-10-CM | POA: Diagnosis not present

## 2023-11-07 LAB — RAD ONC ARIA SESSION SUMMARY
Course Elapsed Days: 26
Plan Fractions Treated to Date: 19
Plan Prescribed Dose Per Fraction: 1.8 Gy
Plan Total Fractions Prescribed: 28
Plan Total Prescribed Dose: 50.4 Gy
Reference Point Dosage Given to Date: 34.2 Gy
Reference Point Session Dosage Given: 1.8 Gy
Session Number: 19

## 2023-11-08 ENCOUNTER — Ambulatory Visit
Admission: RE | Admit: 2023-11-08 | Discharge: 2023-11-08 | Disposition: A | Discharge status: Home / Self Care | Payer: Medicare Other | Source: Ambulatory Visit | Attending: Radiation Oncology | Admitting: Radiation Oncology

## 2023-11-08 ENCOUNTER — Other Ambulatory Visit: Payer: Self-pay

## 2023-11-08 DIAGNOSIS — C50211 Malignant neoplasm of upper-inner quadrant of right female breast: Secondary | ICD-10-CM | POA: Diagnosis not present

## 2023-11-08 DIAGNOSIS — Z510 Encounter for antineoplastic radiation therapy: Secondary | ICD-10-CM | POA: Diagnosis not present

## 2023-11-08 DIAGNOSIS — Z170 Estrogen receptor positive status [ER+]: Secondary | ICD-10-CM | POA: Diagnosis not present

## 2023-11-08 DIAGNOSIS — C50811 Malignant neoplasm of overlapping sites of right female breast: Secondary | ICD-10-CM | POA: Diagnosis not present

## 2023-11-08 LAB — RAD ONC ARIA SESSION SUMMARY
Course Elapsed Days: 27
Plan Fractions Treated to Date: 20
Plan Prescribed Dose Per Fraction: 1.8 Gy
Plan Total Fractions Prescribed: 28
Plan Total Prescribed Dose: 50.4 Gy
Reference Point Dosage Given to Date: 36 Gy
Reference Point Session Dosage Given: 1.8 Gy
Session Number: 20

## 2023-11-09 ENCOUNTER — Other Ambulatory Visit: Payer: Self-pay

## 2023-11-09 ENCOUNTER — Ambulatory Visit
Admission: RE | Admit: 2023-11-09 | Discharge: 2023-11-09 | Disposition: A | Discharge status: Home / Self Care | Payer: Medicare Other | Source: Ambulatory Visit | Attending: Radiation Oncology | Admitting: Radiation Oncology

## 2023-11-09 DIAGNOSIS — C50211 Malignant neoplasm of upper-inner quadrant of right female breast: Secondary | ICD-10-CM | POA: Diagnosis not present

## 2023-11-09 DIAGNOSIS — Z170 Estrogen receptor positive status [ER+]: Secondary | ICD-10-CM | POA: Diagnosis not present

## 2023-11-09 DIAGNOSIS — Z510 Encounter for antineoplastic radiation therapy: Secondary | ICD-10-CM | POA: Diagnosis not present

## 2023-11-09 DIAGNOSIS — C50811 Malignant neoplasm of overlapping sites of right female breast: Secondary | ICD-10-CM | POA: Diagnosis not present

## 2023-11-09 LAB — RAD ONC ARIA SESSION SUMMARY
Course Elapsed Days: 28
Plan Fractions Treated to Date: 21
Plan Prescribed Dose Per Fraction: 1.8 Gy
Plan Total Fractions Prescribed: 28
Plan Total Prescribed Dose: 50.4 Gy
Reference Point Dosage Given to Date: 37.8 Gy
Reference Point Session Dosage Given: 1.8 Gy
Session Number: 21

## 2023-11-10 ENCOUNTER — Ambulatory Visit
Admission: RE | Admit: 2023-11-10 | Discharge: 2023-11-10 | Disposition: A | Discharge status: Home / Self Care | Payer: Medicare Other | Source: Ambulatory Visit | Attending: Radiation Oncology | Admitting: Radiation Oncology

## 2023-11-10 ENCOUNTER — Other Ambulatory Visit: Payer: Self-pay

## 2023-11-10 DIAGNOSIS — C50811 Malignant neoplasm of overlapping sites of right female breast: Secondary | ICD-10-CM | POA: Diagnosis not present

## 2023-11-10 DIAGNOSIS — Z510 Encounter for antineoplastic radiation therapy: Secondary | ICD-10-CM | POA: Diagnosis not present

## 2023-11-10 LAB — RAD ONC ARIA SESSION SUMMARY
Course Elapsed Days: 29
Plan Fractions Treated to Date: 22
Plan Prescribed Dose Per Fraction: 1.8 Gy
Plan Total Fractions Prescribed: 28
Plan Total Prescribed Dose: 50.4 Gy
Reference Point Dosage Given to Date: 39.6 Gy
Reference Point Session Dosage Given: 1.8 Gy
Session Number: 22

## 2023-11-13 ENCOUNTER — Other Ambulatory Visit: Payer: Self-pay

## 2023-11-13 ENCOUNTER — Ambulatory Visit
Admission: RE | Admit: 2023-11-13 | Discharge: 2023-11-13 | Disposition: A | Discharge status: Home / Self Care | Payer: Medicare Other | Source: Ambulatory Visit | Attending: Radiation Oncology | Admitting: Radiation Oncology

## 2023-11-13 DIAGNOSIS — C50811 Malignant neoplasm of overlapping sites of right female breast: Secondary | ICD-10-CM | POA: Diagnosis not present

## 2023-11-13 DIAGNOSIS — C50211 Malignant neoplasm of upper-inner quadrant of right female breast: Secondary | ICD-10-CM | POA: Diagnosis not present

## 2023-11-13 DIAGNOSIS — Z510 Encounter for antineoplastic radiation therapy: Secondary | ICD-10-CM | POA: Diagnosis not present

## 2023-11-13 DIAGNOSIS — Z170 Estrogen receptor positive status [ER+]: Secondary | ICD-10-CM | POA: Diagnosis not present

## 2023-11-13 LAB — RAD ONC ARIA SESSION SUMMARY
Course Elapsed Days: 32
Plan Fractions Treated to Date: 23
Plan Prescribed Dose Per Fraction: 1.8 Gy
Plan Total Fractions Prescribed: 28
Plan Total Prescribed Dose: 50.4 Gy
Reference Point Dosage Given to Date: 41.4 Gy
Reference Point Session Dosage Given: 1.8 Gy
Session Number: 23

## 2023-11-14 ENCOUNTER — Other Ambulatory Visit: Payer: Self-pay

## 2023-11-14 ENCOUNTER — Ambulatory Visit
Admission: RE | Admit: 2023-11-14 | Discharge: 2023-11-14 | Disposition: A | Discharge status: Home / Self Care | Payer: Medicare Other | Source: Ambulatory Visit | Attending: Radiation Oncology

## 2023-11-14 DIAGNOSIS — C50811 Malignant neoplasm of overlapping sites of right female breast: Secondary | ICD-10-CM | POA: Diagnosis not present

## 2023-11-14 DIAGNOSIS — C50211 Malignant neoplasm of upper-inner quadrant of right female breast: Secondary | ICD-10-CM | POA: Diagnosis not present

## 2023-11-14 DIAGNOSIS — Z170 Estrogen receptor positive status [ER+]: Secondary | ICD-10-CM | POA: Diagnosis not present

## 2023-11-14 DIAGNOSIS — Z510 Encounter for antineoplastic radiation therapy: Secondary | ICD-10-CM | POA: Diagnosis not present

## 2023-11-14 LAB — RAD ONC ARIA SESSION SUMMARY
Course Elapsed Days: 33
Plan Fractions Treated to Date: 24
Plan Prescribed Dose Per Fraction: 1.8 Gy
Plan Total Fractions Prescribed: 28
Plan Total Prescribed Dose: 50.4 Gy
Reference Point Dosage Given to Date: 43.2 Gy
Reference Point Session Dosage Given: 1.8 Gy
Session Number: 24

## 2023-11-15 ENCOUNTER — Other Ambulatory Visit: Payer: Self-pay

## 2023-11-15 ENCOUNTER — Ambulatory Visit (HOSPITAL_BASED_OUTPATIENT_CLINIC_OR_DEPARTMENT_OTHER)
Admission: RE | Admit: 2023-11-15 | Discharge: 2023-11-15 | Disposition: A | Discharge status: Home / Self Care | Payer: Medicare Other | Source: Ambulatory Visit | Attending: Hematology and Oncology | Admitting: Hematology and Oncology

## 2023-11-15 ENCOUNTER — Ambulatory Visit
Admission: RE | Admit: 2023-11-15 | Discharge: 2023-11-15 | Disposition: A | Discharge status: Home / Self Care | Payer: Medicare Other | Source: Ambulatory Visit | Attending: Radiation Oncology | Admitting: Radiation Oncology

## 2023-11-15 DIAGNOSIS — Z13820 Encounter for screening for osteoporosis: Secondary | ICD-10-CM | POA: Diagnosis not present

## 2023-11-15 DIAGNOSIS — Z780 Asymptomatic menopausal state: Secondary | ICD-10-CM | POA: Insufficient documentation

## 2023-11-15 DIAGNOSIS — C50811 Malignant neoplasm of overlapping sites of right female breast: Secondary | ICD-10-CM | POA: Insufficient documentation

## 2023-11-15 DIAGNOSIS — C50211 Malignant neoplasm of upper-inner quadrant of right female breast: Secondary | ICD-10-CM | POA: Diagnosis not present

## 2023-11-15 DIAGNOSIS — Z170 Estrogen receptor positive status [ER+]: Secondary | ICD-10-CM | POA: Diagnosis not present

## 2023-11-15 DIAGNOSIS — M85832 Other specified disorders of bone density and structure, left forearm: Secondary | ICD-10-CM | POA: Diagnosis not present

## 2023-11-15 DIAGNOSIS — Z510 Encounter for antineoplastic radiation therapy: Secondary | ICD-10-CM | POA: Diagnosis not present

## 2023-11-15 LAB — RAD ONC ARIA SESSION SUMMARY
Course Elapsed Days: 34
Plan Fractions Treated to Date: 25
Plan Prescribed Dose Per Fraction: 1.8 Gy
Plan Total Fractions Prescribed: 28
Plan Total Prescribed Dose: 50.4 Gy
Reference Point Dosage Given to Date: 45 Gy
Reference Point Session Dosage Given: 1.8 Gy
Session Number: 25

## 2023-11-16 ENCOUNTER — Other Ambulatory Visit: Payer: Self-pay

## 2023-11-16 ENCOUNTER — Ambulatory Visit
Admission: RE | Admit: 2023-11-16 | Discharge: 2023-11-16 | Disposition: A | Discharge status: Home / Self Care | Payer: Medicare Other | Source: Ambulatory Visit | Attending: Radiation Oncology | Admitting: Radiation Oncology

## 2023-11-16 DIAGNOSIS — Z510 Encounter for antineoplastic radiation therapy: Secondary | ICD-10-CM | POA: Diagnosis not present

## 2023-11-16 DIAGNOSIS — C50811 Malignant neoplasm of overlapping sites of right female breast: Secondary | ICD-10-CM | POA: Diagnosis not present

## 2023-11-16 DIAGNOSIS — C50211 Malignant neoplasm of upper-inner quadrant of right female breast: Secondary | ICD-10-CM | POA: Diagnosis not present

## 2023-11-16 DIAGNOSIS — Z170 Estrogen receptor positive status [ER+]: Secondary | ICD-10-CM | POA: Diagnosis not present

## 2023-11-16 LAB — RAD ONC ARIA SESSION SUMMARY
Course Elapsed Days: 35
Plan Fractions Treated to Date: 26
Plan Prescribed Dose Per Fraction: 1.8 Gy
Plan Total Fractions Prescribed: 28
Plan Total Prescribed Dose: 50.4 Gy
Reference Point Dosage Given to Date: 46.8 Gy
Reference Point Session Dosage Given: 1.8 Gy
Session Number: 26

## 2023-11-17 ENCOUNTER — Ambulatory Visit
Admission: RE | Admit: 2023-11-17 | Discharge: 2023-11-17 | Disposition: A | Discharge status: Home / Self Care | Payer: Medicare Other | Source: Ambulatory Visit | Attending: Radiation Oncology | Admitting: Radiation Oncology

## 2023-11-17 ENCOUNTER — Other Ambulatory Visit: Payer: Self-pay

## 2023-11-17 DIAGNOSIS — C50811 Malignant neoplasm of overlapping sites of right female breast: Secondary | ICD-10-CM | POA: Diagnosis not present

## 2023-11-17 DIAGNOSIS — Z510 Encounter for antineoplastic radiation therapy: Secondary | ICD-10-CM | POA: Diagnosis not present

## 2023-11-17 DIAGNOSIS — Z170 Estrogen receptor positive status [ER+]: Secondary | ICD-10-CM | POA: Diagnosis not present

## 2023-11-17 DIAGNOSIS — C50211 Malignant neoplasm of upper-inner quadrant of right female breast: Secondary | ICD-10-CM | POA: Diagnosis not present

## 2023-11-17 LAB — RAD ONC ARIA SESSION SUMMARY
Course Elapsed Days: 36
Plan Fractions Treated to Date: 27
Plan Prescribed Dose Per Fraction: 1.8 Gy
Plan Total Fractions Prescribed: 28
Plan Total Prescribed Dose: 50.4 Gy
Reference Point Dosage Given to Date: 48.6 Gy
Reference Point Session Dosage Given: 1.8 Gy
Session Number: 27

## 2023-11-20 ENCOUNTER — Ambulatory Visit
Admission: RE | Admit: 2023-11-20 | Discharge: 2023-11-20 | Disposition: A | Discharge status: Home / Self Care | Payer: Medicare Other | Source: Ambulatory Visit | Attending: Radiation Oncology | Admitting: Radiation Oncology

## 2023-11-20 ENCOUNTER — Other Ambulatory Visit: Payer: Self-pay

## 2023-11-20 DIAGNOSIS — Z170 Estrogen receptor positive status [ER+]: Secondary | ICD-10-CM | POA: Diagnosis not present

## 2023-11-20 DIAGNOSIS — C50811 Malignant neoplasm of overlapping sites of right female breast: Secondary | ICD-10-CM | POA: Diagnosis not present

## 2023-11-20 DIAGNOSIS — Z510 Encounter for antineoplastic radiation therapy: Secondary | ICD-10-CM | POA: Insufficient documentation

## 2023-11-20 DIAGNOSIS — C50211 Malignant neoplasm of upper-inner quadrant of right female breast: Secondary | ICD-10-CM | POA: Diagnosis not present

## 2023-11-20 LAB — RAD ONC ARIA SESSION SUMMARY
Course Elapsed Days: 39
Plan Fractions Treated to Date: 28
Plan Prescribed Dose Per Fraction: 1.8 Gy
Plan Total Fractions Prescribed: 28
Plan Total Prescribed Dose: 50.4 Gy
Reference Point Dosage Given to Date: 50.4 Gy
Reference Point Session Dosage Given: 1.8 Gy
Session Number: 28

## 2023-11-21 ENCOUNTER — Ambulatory Visit
Admission: RE | Admit: 2023-11-21 | Discharge: 2023-11-21 | Disposition: A | Discharge status: Home / Self Care | Payer: Medicare Other | Source: Ambulatory Visit | Attending: Radiation Oncology | Admitting: Radiation Oncology

## 2023-11-21 ENCOUNTER — Other Ambulatory Visit: Payer: Self-pay

## 2023-11-21 DIAGNOSIS — Z510 Encounter for antineoplastic radiation therapy: Secondary | ICD-10-CM | POA: Diagnosis not present

## 2023-11-21 DIAGNOSIS — Z170 Estrogen receptor positive status [ER+]: Secondary | ICD-10-CM | POA: Diagnosis not present

## 2023-11-21 DIAGNOSIS — C50211 Malignant neoplasm of upper-inner quadrant of right female breast: Secondary | ICD-10-CM | POA: Diagnosis not present

## 2023-11-21 DIAGNOSIS — C50811 Malignant neoplasm of overlapping sites of right female breast: Secondary | ICD-10-CM | POA: Diagnosis not present

## 2023-11-21 LAB — RAD ONC ARIA SESSION SUMMARY
Course Elapsed Days: 40
Plan Fractions Treated to Date: 1
Plan Prescribed Dose Per Fraction: 2 Gy
Plan Total Fractions Prescribed: 5
Plan Total Prescribed Dose: 10 Gy
Reference Point Dosage Given to Date: 2 Gy
Reference Point Session Dosage Given: 2 Gy
Session Number: 29

## 2023-11-22 ENCOUNTER — Ambulatory Visit
Admission: RE | Admit: 2023-11-22 | Discharge: 2023-11-22 | Disposition: A | Discharge status: Home / Self Care | Payer: Medicare Other | Source: Ambulatory Visit | Attending: Radiation Oncology | Admitting: Radiation Oncology

## 2023-11-22 ENCOUNTER — Other Ambulatory Visit: Payer: Self-pay

## 2023-11-22 DIAGNOSIS — C50811 Malignant neoplasm of overlapping sites of right female breast: Secondary | ICD-10-CM | POA: Diagnosis not present

## 2023-11-22 DIAGNOSIS — Z510 Encounter for antineoplastic radiation therapy: Secondary | ICD-10-CM | POA: Diagnosis not present

## 2023-11-22 DIAGNOSIS — C50211 Malignant neoplasm of upper-inner quadrant of right female breast: Secondary | ICD-10-CM | POA: Diagnosis not present

## 2023-11-22 DIAGNOSIS — Z170 Estrogen receptor positive status [ER+]: Secondary | ICD-10-CM | POA: Diagnosis not present

## 2023-11-22 LAB — RAD ONC ARIA SESSION SUMMARY
Course Elapsed Days: 41
Plan Fractions Treated to Date: 2
Plan Prescribed Dose Per Fraction: 2 Gy
Plan Total Fractions Prescribed: 5
Plan Total Prescribed Dose: 10 Gy
Reference Point Dosage Given to Date: 4 Gy
Reference Point Session Dosage Given: 2 Gy
Session Number: 30

## 2023-11-23 ENCOUNTER — Ambulatory Visit
Admission: RE | Admit: 2023-11-23 | Discharge: 2023-11-23 | Disposition: A | Discharge status: Home / Self Care | Payer: Medicare Other | Source: Ambulatory Visit | Attending: Radiation Oncology | Admitting: Radiation Oncology

## 2023-11-23 ENCOUNTER — Other Ambulatory Visit: Payer: Self-pay

## 2023-11-23 DIAGNOSIS — C50811 Malignant neoplasm of overlapping sites of right female breast: Secondary | ICD-10-CM | POA: Diagnosis not present

## 2023-11-23 DIAGNOSIS — Z510 Encounter for antineoplastic radiation therapy: Secondary | ICD-10-CM | POA: Diagnosis not present

## 2023-11-23 LAB — RAD ONC ARIA SESSION SUMMARY
Course Elapsed Days: 42
Plan Fractions Treated to Date: 3
Plan Prescribed Dose Per Fraction: 2 Gy
Plan Total Fractions Prescribed: 5
Plan Total Prescribed Dose: 10 Gy
Reference Point Dosage Given to Date: 6 Gy
Reference Point Session Dosage Given: 2 Gy
Session Number: 31

## 2023-11-24 ENCOUNTER — Ambulatory Visit
Admission: RE | Admit: 2023-11-24 | Discharge: 2023-11-24 | Disposition: A | Discharge status: Home / Self Care | Source: Ambulatory Visit | Attending: Radiation Oncology | Admitting: Radiation Oncology

## 2023-11-24 ENCOUNTER — Ambulatory Visit
Admission: RE | Admit: 2023-11-24 | Discharge: 2023-11-24 | Disposition: A | Discharge status: Home / Self Care | Payer: Medicare Other | Source: Ambulatory Visit | Attending: Radiation Oncology | Admitting: Radiation Oncology

## 2023-11-24 ENCOUNTER — Other Ambulatory Visit: Payer: Self-pay

## 2023-11-24 DIAGNOSIS — Z510 Encounter for antineoplastic radiation therapy: Secondary | ICD-10-CM | POA: Diagnosis not present

## 2023-11-24 DIAGNOSIS — C50811 Malignant neoplasm of overlapping sites of right female breast: Secondary | ICD-10-CM | POA: Diagnosis not present

## 2023-11-24 LAB — RAD ONC ARIA SESSION SUMMARY
Course Elapsed Days: 43
Plan Fractions Treated to Date: 4
Plan Prescribed Dose Per Fraction: 2 Gy
Plan Total Fractions Prescribed: 5
Plan Total Prescribed Dose: 10 Gy
Reference Point Dosage Given to Date: 8 Gy
Reference Point Session Dosage Given: 2 Gy
Session Number: 32

## 2023-11-27 ENCOUNTER — Other Ambulatory Visit: Payer: Self-pay

## 2023-11-27 ENCOUNTER — Ambulatory Visit
Admission: RE | Admit: 2023-11-27 | Discharge: 2023-11-27 | Disposition: A | Discharge status: Home / Self Care | Payer: Medicare Other | Source: Ambulatory Visit | Attending: Radiation Oncology | Admitting: Radiation Oncology

## 2023-11-27 DIAGNOSIS — C50211 Malignant neoplasm of upper-inner quadrant of right female breast: Secondary | ICD-10-CM | POA: Diagnosis not present

## 2023-11-27 DIAGNOSIS — C50811 Malignant neoplasm of overlapping sites of right female breast: Secondary | ICD-10-CM | POA: Diagnosis not present

## 2023-11-27 DIAGNOSIS — Z510 Encounter for antineoplastic radiation therapy: Secondary | ICD-10-CM | POA: Diagnosis not present

## 2023-11-27 DIAGNOSIS — Z170 Estrogen receptor positive status [ER+]: Secondary | ICD-10-CM | POA: Diagnosis not present

## 2023-11-27 LAB — RAD ONC ARIA SESSION SUMMARY
Course Elapsed Days: 46
Plan Fractions Treated to Date: 5
Plan Prescribed Dose Per Fraction: 2 Gy
Plan Total Fractions Prescribed: 5
Plan Total Prescribed Dose: 10 Gy
Reference Point Dosage Given to Date: 10 Gy
Reference Point Session Dosage Given: 2 Gy
Session Number: 33

## 2023-11-28 NOTE — Radiation Completion Notes (Addendum)
  Radiation Oncology         [phone] ________________________________  Name: [name] [name] MRN: [id number]  Date of Service: 11/27/2023  DOB: 08-Oct-1950  End of Treatment Note  Diagnosis: Stage IA, pT1cN0M0, grade 2, ER/PR positive invasive mammary carcinoma of the right breast   Intent: Curative     ==========DELIVERED PLANS==========  First Treatment Date: 2023-10-12 Last Treatment Date: 2023-11-27   Plan Name: Breast_R Site: Breast, Right Technique: 3D Mode: Photon Dose Per Fraction: 1.8 Gy Prescribed Dose (Delivered / Prescribed): 50.4 Gy / 50.4 Gy Prescribed Fxs (Delivered / Prescribed): 28 / 28   Plan Name: Breast_R_Bst Site: Breast, Right Technique: Electron Mode: Electron Dose Per Fraction: 2 Gy Prescribed Dose (Delivered / Prescribed): 10 Gy / 10 Gy Prescribed Fxs (Delivered / Prescribed): 5 / 5     ==========ON TREATMENT VISIT DATES========== 2023-10-13, 2023-10-20, 2023-10-27, 2023-11-02, 2023-11-09, 2023-11-17, 2023-11-24      See weekly On Treatment Notes in Epic for details in the Media tab (listed as Progress notes on the On Treatment Visit Dates listed above). The patient tolerated radiation. She developed anticipated skin changes in the treatment field.   The patient will receive a call in about one month from the radiation oncology department. She will continue follow up with Dr. Al Pimple as well.      Osker Mason, PAC

## 2023-12-12 ENCOUNTER — Telehealth: Payer: Self-pay | Admitting: *Deleted

## 2023-12-12 NOTE — Telephone Encounter (Signed)
 This RN spoke with pt her call stating she had episode on Sunday 3/23 at approx 9pm - " I got up and started making brownies and while I was stirring them - I suddenly had the most severe hot flash that I had to put a fan on - then I felt dizzy so I sat down and continued to stir the brownies - but then felt very weak and my vision got blurry and like the room went dim"  She said the above was about 15 minutes in duration.  She recovered and checked her blood pressure which was in normal range.  She had no further sequela or further occurrence.  Pt completed radiation on 11/27/2023 and has not started the arimidex ( due to start 12/19/2023).  This RN discussed above likely not related to her cancer history but more of a vagal response that was random.  Informed her to discuss with her primary MD as well as to monitor- review medications with pt taking BP meds  Discussed potential onset of hot flashes when she starts the arimidex - which she will take in am and monitor.  Pt understands to call if further concerns as well as this note will be forwarded to MD for review of above.

## 2023-12-22 NOTE — Progress Notes (Signed)
  Radiation Oncology         [phone] ________________________________  Name: [name] [name] MRN: [id number]  Date of Service: 12/22/2023  DOB: [birth year]  Post Treatment Telephone Note  Diagnosis:  Stage IA, pT1cN0M0, grade 2, ER/PR positive invasive mammary carcinoma of the right breast (as documented in provider EOT note)  The patient was available for call today.   Symptoms of fatigue have improved since completing therapy.  Symptoms of skin changes have improved since completing therapy.  Patient reports an episode of a hot flash, weakness, and darkening vision, lasting 15 min on 12/03/2023. Patient states "She recovered quickly and was not alarmed." Dr. Al Pimple is aware of this.  The patient was encouraged to avoid sun exposure in the area of prior treatment for up to one year following radiation with either sunscreen or by the style of clothing worn in the sun.  The patient has scheduled follow up with her medical oncologist Dr. Al Pimple for ongoing surveillance, and was encouraged to call if she develops concerns or questions regarding radiation.   This concludes the interaction.  Ruel Favors, LPN

## 2023-12-25 ENCOUNTER — Inpatient Hospital Stay
Admission: RE | Admit: 2023-12-25 | Discharge: 2023-12-25 | Disposition: A | Discharge status: Home / Self Care | Source: Ambulatory Visit

## 2024-01-19 ENCOUNTER — Encounter: Payer: Self-pay | Admitting: *Deleted

## 2024-01-23 ENCOUNTER — Inpatient Hospital Stay: Payer: Medicare Other | Attending: Adult Health | Admitting: Adult Health

## 2024-01-23 ENCOUNTER — Encounter: Payer: Self-pay | Admitting: Adult Health

## 2024-01-23 VITALS — BP 140/66 | HR 65 | Temp 98.0°F | Resp 18 | Ht 62.0 in | Wt 139.9 lb

## 2024-01-23 DIAGNOSIS — Z923 Personal history of irradiation: Secondary | ICD-10-CM | POA: Diagnosis not present

## 2024-01-23 DIAGNOSIS — Z800 Family history of malignant neoplasm of digestive organs: Secondary | ICD-10-CM | POA: Diagnosis not present

## 2024-01-23 DIAGNOSIS — Z170 Estrogen receptor positive status [ER+]: Secondary | ICD-10-CM | POA: Diagnosis not present

## 2024-01-23 DIAGNOSIS — C50811 Malignant neoplasm of overlapping sites of right female breast: Secondary | ICD-10-CM

## 2024-01-23 DIAGNOSIS — Z1721 Progesterone receptor positive status: Secondary | ICD-10-CM | POA: Insufficient documentation

## 2024-01-23 DIAGNOSIS — Z79811 Long term (current) use of aromatase inhibitors: Secondary | ICD-10-CM | POA: Insufficient documentation

## 2024-01-23 DIAGNOSIS — Z803 Family history of malignant neoplasm of breast: Secondary | ICD-10-CM | POA: Insufficient documentation

## 2024-01-23 DIAGNOSIS — M8580 Other specified disorders of bone density and structure, unspecified site: Secondary | ICD-10-CM | POA: Diagnosis not present

## 2024-01-23 DIAGNOSIS — Z806 Family history of leukemia: Secondary | ICD-10-CM | POA: Insufficient documentation

## 2024-01-23 DIAGNOSIS — Z1732 Human epidermal growth factor receptor 2 negative status: Secondary | ICD-10-CM | POA: Diagnosis not present

## 2024-01-23 NOTE — Progress Notes (Signed)
 SURVIVORSHIP VISIT:  BRIEF ONCOLOGIC HISTORY:  Oncology History  Malignant neoplasm of overlapping sites of right breast in female, estrogen receptor positive (HCC)  06/29/2023 Mammogram   Mammogram October 2024 showed right breast mass at 1:00 at the palpable site of concern 4 cm from the nipple measuring 7 x 5 x 6 mm.  Adjacent to this is a complex circumscribed mass measuring 1.6 x 1.1 x 1.2 cm.  Ultrasound of the right axilla demonstrated normal lymph nodes.   07/03/2023 Pathology Results   Right breast needle core biopsy showed invasive mammary carcinoma with mucinous features, DCIS with cribriform pattern and intermediate grade, overall grade of the invasive, (2, prognostics showed ER 9089% strong staining, PR 99% positive strong staining, HER2 0 and Ki-67 of 10%   07/10/2023 Initial Diagnosis   Malignant neoplasm of upper-inner quadrant of right breast in female, estrogen receptor positive (HCC)   07/10/2023 Cancer Staging   Staging form: Breast, AJCC 8th Edition - Clinical: Stage IA (cT1c, cN0, cM0, G2, ER+, PR+, HER2-) - Signed by Bettejane Brownie, PA-C on 07/10/2023 Method of lymph node assessment: Clinical Histologic grading system: 3 grade system    Genetic Testing   [facility] CancerNext Panel+RNA was Negative. Report date is 07/27/2023.   The CancerNext gene panel offered by [employer] includes sequencing, rearrangement analysis, and RNA analysis for the following 34 genes:   APC, ATM, AXIN2, BARD1, BMPR1A, BRCA1, BRCA2, BRIP1, CDH1, CDK4, CDKN2A, CHEK2, DICER1, HOXB13, EPCAM, GREM1, MLH1, MSH2, MSH3, MSH6, MUTYH, NF1, NTHL1, PALB2, PMS2, POLD1, POLE, PTEN, RAD51C, RAD51D, SMAD4, SMARCA4, STK11, and TP53.    08/11/2023 Definitive Surgery   A. BREAST, RIGHT W/SEEDS, LUMPECTOMY:  Invasive ductal carcinoma with extracellular mucin, two tumors, 1.9 and  0.5 cm, grade 2  Ductal carcinoma in situ: Cribriform, intermediate nuclear grade  Margins, invasive:- Negative       Closest, invasive: 1 mm, anterior (coil clip)  Margins, DCIS: Negative      Closest, DCIS: 2 mm, inferior  Lymphovascular invasion: Not identified  Prognostic markers:  ER positive, PR positive, Her2 negative, Ki-67 10%  Other: Atypical ductal hyperplasia and intraductal papillomas  See oncology table   B. BREAST, RIGHT ADDITIONAL INFERIOR MEDIAL MARGIN, EXCISION:  Ductal carcinoma in situ, intermediate grade  Atypical ductal hyperplasia and intraductal papillomas  DCIS 2 mm from inferior margin     08/11/2023 Oncotype testing   14/4%, <1% chemo benefit   08/11/2023 Cancer Staging   Staging form: Breast, AJCC 8th Edition - Pathologic stage from 08/11/2023: Stage IA (pT1c, pN0, cM0, G2, ER+, PR+, HER2-) - Signed by Percival Brace, NP on 09/18/2023 Stage prefix: Initial diagnosis Histologic grading system: 3 grade system   10/12/2023 - 11/27/2023 Radiation Therapy   Plan Name: Breast_R Site: Breast, Right Technique: 3D Mode: Photon Dose Per Fraction: 1.8 Gy Prescribed Dose (Delivered / Prescribed): 50.4 Gy / 50.4 Gy Prescribed Fxs (Delivered / Prescribed): 28 / 28   Plan Name: Breast_R_Bst Site: Breast, Right Technique: Electron Mode: Electron Dose Per Fraction: 2 Gy Prescribed Dose (Delivered / Prescribed): 10 Gy / 10 Gy Prescribed Fxs (Delivered / Prescribed): 5 / 5   12/2023 -  Anti-estrogen oral therapy   Anastrozole      INTERVAL HISTORY:  [name] [name] to review her survivorship care plan detailing her treatment course for breast cancer, as well as monitoring long-term side effects of that treatment, education regarding health maintenance, screening, and overall wellness and health promotion.     Overall, [name] [name] reports feeling  quite well.  She is taking anastrozole  daily and tolerates it moderately well.  REVIEW OF SYSTEMS:  Review of Systems  Constitutional:  Negative for appetite change, chills, fatigue, fever and unexpected weight change.  HENT:    Negative for hearing loss, lump/mass and trouble swallowing.   Eyes:  Negative for eye problems and icterus.  Respiratory:  Negative for chest tightness, cough and shortness of breath.   Cardiovascular:  Negative for chest pain, leg swelling and palpitations.  Gastrointestinal:  Negative for abdominal distention, abdominal pain, constipation, diarrhea, nausea and vomiting.  Endocrine: Negative for hot flashes.  Genitourinary:  Negative for difficulty urinating.   Musculoskeletal:  Negative for arthralgias.  Skin:  Negative for itching and rash.  Neurological:  Negative for dizziness, extremity weakness, headaches and numbness.  Hematological:  Negative for adenopathy. Does not bruise/bleed easily.  Psychiatric/Behavioral:  Negative for depression. The patient is not nervous/anxious.    Breast: Denies any new nodularity, masses, tenderness, nipple changes, or nipple discharge.       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 Edited by Joann Mu, MD   GERD (gastroesophageal reflux disease)    Headache(784.0)    Hypercholesterolemia    Hypertension    Idiopathic urticaria    Varicose veins of lower extremities    Past Surgical History:  Procedure Laterality Date   AUGMENTATION MAMMAPLASTY Bilateral    benign breast biopsy     bilateral breast implants     BREAST BIOPSY     unsure which breast and when   BREAST BIOPSY Right 07/03/2023   US  RT BREAST BX W LOC DEV EA ADD LESION IMG BX SPEC US  GUIDE 07/03/2023 [facility]   BREAST BIOPSY Right 07/03/2023   US  RT BREAST BX W  LOC DEV 1ST LESION IMG BX SPEC US  GUIDE 07/03/2023 [facility]   BREAST BIOPSY Right 08/08/2023   US  RT RADIOACTIVE SEED EA ADD LESION 08/08/2023 [facility]   BREAST BIOPSY  08/08/2023   US  RT RADIOACTIVE SEED LOC 08/08/2023 [facility]   BREAST IMPLANT REMOVAL Bilateral 08/11/2023   Procedure: BILATERAL REMOVAL BREAST IMPLANTS;  Surgeon: Thornell Flirt, DO;  Location: [facility];  Service: Plastics;  Laterality: Bilateral;   BREAST LUMPECTOMY WITH RADIOACTIVE SEED LOCALIZATION Right 08/11/2023   Procedure: RIGHT BREAST RADIOACTIVE SEED LOCALIZED LUMPECTOMY x2;  Surgeon: Caralyn Chandler, MD;  Location: [facility];  Service: General;  Laterality: Right;   cataract right eye w/ lens implant     COLONOSCOPY  03/20/2023   COLONOSCOPY  2012   Dr Adan Holms, normal   excision of large anal polyp     MASTOPEXY Bilateral 08/11/2023   Procedure: MASTOPEXY;  Surgeon: Thornell Flirt, DO;  Location: [facility];  Service: Plastics;  Laterality: Bilateral;   PERIPHERAL VASCULAR CATHETERIZATION N/A 02/11/2016   Procedure: Renal Angiography;  Surgeon: Arvil Lauber, MD;  Location: [facility];  Service: Cardiovascular;  Laterality: N/A;   PLACEMENT OF BREAST IMPLANTS Bilateral 08/11/2023   Procedure: PLACEMENT OF SILCONE IMPLANTS;  Surgeon: Thornell Flirt, DO;  Location: [facility];  Service: Plastics;  Laterality: Bilateral;   POLYPECTOMY     VAGINAL HYSTERECTOMY     varicose vein       ALLERGIES:  No Known Allergies   CURRENT MEDICATIONS:  Outpatient Encounter Medications as of 01/23/2024  Medication Sig   amLODipine  (NORVASC ) 5 MG tablet TAKE 1 TABLET BY MOUTH EVERY DAY   anastrozole  (ARIMIDEX ) 1 MG tablet Take 1 tablet (1 mg total) by mouth daily.   aspirin  EC 81 MG tablet Take 81 mg by mouth daily.   BIOTIN PO Take by mouth.   cholecalciferol (VITAMIN D ) 1000 units tablet Take 1,000 Units  by mouth daily.   nebivolol  (BYSTOLIC ) 10 MG tablet TAKE 1 TABLET BY MOUTH EVERY DAY   rosuvastatin  (CRESTOR ) 10 MG tablet TAKE 1 TABLET BY MOUTH EVERY DAY   vitamin E 400 UNIT capsule Take 400 Units by mouth daily.   No facility-administered encounter medications on file as of 01/23/2024.     ONCOLOGIC FAMILY HISTORY:  Family History  Problem Relation Age of Onset   Hypertension Mother    Hyperlipidemia Mother    Diabetes Mother    Breast cancer Mother 43   Pulmonary embolism Mother    Deep vein thrombosis Mother    Pancreatic cancer Mother 49   Dementia Father    Leukemia Maternal Aunt    Sickle cell anemia Cousin    Colon cancer Neg Hx    Esophageal cancer Neg Hx    Stomach cancer Neg Hx    Colon polyps Neg Hx    Rectal cancer Neg Hx      SOCIAL HISTORY:  Social History   Socioeconomic History   Marital status: Single    Spouse name: Not on file   Number of children: 0   Years of education: 12   Highest education level: Not on file  Occupational History    Comment: [employer]  Tobacco Use   Smoking status: Never   Smokeless tobacco: Never  Vaping Use   Vaping status: Never Used  Substance and Sexual Activity   Alcohol use: No    Alcohol/week: 0.0 standard drinks of alcohol   Drug use: No   Sexual activity: Not on file  Other Topics Concern   Not on file  Social History Narrative   Lives alone   No caffeine use   Social Drivers of Corporate investment banker Strain: Low Risk  (08/29/2023)   Overall Financial Resource Strain (CARDIA)    Difficulty of Paying Living Expenses: Not hard at all  Food Insecurity: No Food Insecurity (08/29/2023)   Hunger Vital Sign    Worried About Running Out of Food in the Last Year: Never true    Ran Out of Food in the Last Year: Never true  Transportation Needs: No Transportation Needs (08/29/2023)   PRAPARE - Administrator, Civil Service (Medical): No    Lack of Transportation (Non-Medical): No  Physical  Activity: Inactive (08/29/2023)   Exercise Vital Sign    Days of Exercise per Week: 0 days    Minutes of Exercise per Session: 0 min  Stress: No Stress Concern Present (08/29/2023)   [employer] of Occupational Health - Occupational Stress Questionnaire    Feeling of Stress : Not at all  Social Connections: Socially Isolated (08/29/2023)   Social Connection and Isolation Panel [NHANES]    Frequency of Communication with Friends and Family: More than three times a week  Frequency of Social Gatherings with Friends and Family: Once a week    Attends Religious Services: Never    Database administrator or Organizations: No    Attends Banker Meetings: Never    Marital Status: Never married  Intimate Partner Violence: Not At Risk (08/29/2023)   Humiliation, Afraid, Rape, and Kick questionnaire    Fear of Current or Ex-Partner: No    Emotionally Abused: No    Physically Abused: No    Sexually Abused: No     OBSERVATIONS/OBJECTIVE:  BP (!) 140/66 (BP Location: Left Arm, Patient Position: Sitting)   Pulse 65   Temp 98 F (36.7 C) (Temporal)   Resp 18   Ht 5\' 2"  (1.575 m)   Wt 139 lb 14.4 oz (63.5 kg)   LMP  (LMP Unknown)   SpO2 100%   BMI 25.59 kg/m  GENERAL: Patient is a well appearing female in no acute distress HEENT:  Sclerae anicteric.  Oropharynx clear and moist. No ulcerations or evidence of oropharyngeal candidiasis. Neck is supple.  NODES:  No cervical, supraclavicular, or axillary lymphadenopathy palpated.  BREAST EXAM: Right breast status postlumpectomy and radiation no sign of local recurrence left breast is benign. LUNGS:  Clear to auscultation bilaterally.  No wheezes or rhonchi. HEART:  Regular rate and rhythm. No murmur appreciated. ABDOMEN:  Soft, nontender.  Positive, normoactive bowel sounds. No organomegaly palpated. MSK:  No focal spinal tenderness to palpation. Full range of motion bilaterally in the upper extremities. EXTREMITIES:  No  peripheral edema.   SKIN:  Clear with no obvious rashes or skin changes. No nail dyscrasia. NEURO:  Nonfocal. Well oriented.  Appropriate affect.   LABORATORY DATA:  None for this visit.  DIAGNOSTIC IMAGING:  None for this visit.      ASSESSMENT AND PLAN:  Ms.. [name] is a pleasant 73 y.o. female with Stage 1A right breast invasive ductal carcinoma, ER+/PR+/HER2-, diagnosed in 06/2023, treated with lumpectomy, adjuvant radiation therapy, and anti-estrogen therapy with anastrozole  beginning in 12/2023.  She presents to the Survivorship Clinic for our initial meeting and routine follow-up post-completion of treatment for breast cancer.    1. Stage IA right breast cancer:  [name] [name] is continuing to recover from definitive treatment for breast cancer. She will follow-up with her medical oncologist, Dr. Arno Bibles in 06/2024 with history and physical exam per surveillance protocol.  She will continue her anti-estrogen therapy with Anastrozole . Thus far, she is tolerating the Anastrozole  well, with minimal side effects. Her mammogram is due 06/2024; orders placed today.   Today, a comprehensive survivorship care plan and treatment summary was reviewed with the patient today detailing her breast cancer diagnosis, treatment course, potential late/long-term effects of treatment, appropriate follow-up care with recommendations for the future, and patient education resources.  A copy of this summary, along with a letter will be sent to the patient's primary care provider via mail/fax/In Basket message after today's visit.    2. Bone health:  Given [name] [name]'s age/history of breast cancer and her current treatment regimen including anti-estrogen therapy with Anastrozole , she is at risk for bone demineralization.  Her last DEXA scan was 10/2023, which showed osteopenia with a t score of -1.1 in the left forearm.  She was given education on specific activities to promote bone health.  3. Cancer screening:  Due to  [name] [name]'s history and her age, she should receive screening for skin cancers, colon cancer, and gynecologic cancers.  The information and recommendations are listed  on the patient's comprehensive care plan/treatment summary and were reviewed in detail with the patient.    4. Health maintenance and wellness promotion: [name] [name] was encouraged to consume 5-7 servings of fruits and vegetables per day. We reviewed the "Nutrition Rainbow" handout.  She was also encouraged to engage in moderate to vigorous exercise for 30 minutes per day most days of the week.  She was instructed to limit her alcohol consumption and continue to abstain from tobacco use.     5. Support services/counseling: It is not uncommon for this period of the patient's cancer care trajectory to be one of many emotions and stressors.   She was given information regarding our available services and encouraged to contact me with any questions or for help enrolling in any of our support group/programs.    Follow up instructions:    -Return to cancer center 06/2024 for f/u with Dr. Arno Bibles  -Mammogram due in 06/2024 -She is welcome to return back to the Survivorship Clinic at any time; no additional follow-up needed at this time.  -Consider referral back to survivorship as a long-term survivor for continued surveillance  The patient was provided an opportunity to ask questions and all were answered. The patient agreed with the plan and demonstrated an understanding of the instructions.   Total encounter time:40 minutes*in face-to-face visit time, chart review, lab review, care coordination, order entry, and documentation of the encounter time.    Alwin Baars, NP 01/23/24 11:17 AM Medical Oncology and Hematology [facility] [address] [place], [place] [address] Tel. [phone]    Fax. [phone]  *Total Encounter Time as defined by the Centers for Medicare and Medicaid Services includes, in addition to the  face-to-face time of a patient visit (documented in the note above) non-face-to-face time: obtaining and reviewing outside history, ordering and reviewing medications, tests or procedures, care coordination (communications with other health care professionals or caregivers) and documentation in the medical record.

## 2024-01-31 ENCOUNTER — Ambulatory Visit: Payer: Medicare Other | Admitting: Physician Assistant

## 2024-02-11 ENCOUNTER — Other Ambulatory Visit: Payer: Self-pay | Admitting: Physician Assistant

## 2024-02-23 ENCOUNTER — Other Ambulatory Visit: Payer: Self-pay | Admitting: Physician Assistant

## 2024-03-01 ENCOUNTER — Encounter: Payer: Self-pay | Admitting: Physician Assistant

## 2024-03-01 ENCOUNTER — Ambulatory Visit (INDEPENDENT_AMBULATORY_CARE_PROVIDER_SITE_OTHER): Admitting: Physician Assistant

## 2024-03-01 VITALS — BP 118/70 | HR 59 | Temp 97.3°F | Ht 62.0 in | Wt 136.2 lb

## 2024-03-01 DIAGNOSIS — Z131 Encounter for screening for diabetes mellitus: Secondary | ICD-10-CM

## 2024-03-01 DIAGNOSIS — Z170 Estrogen receptor positive status [ER+]: Secondary | ICD-10-CM

## 2024-03-01 DIAGNOSIS — I10 Essential (primary) hypertension: Secondary | ICD-10-CM

## 2024-03-01 DIAGNOSIS — E782 Mixed hyperlipidemia: Secondary | ICD-10-CM

## 2024-03-01 DIAGNOSIS — F5101 Primary insomnia: Secondary | ICD-10-CM

## 2024-03-01 DIAGNOSIS — C50811 Malignant neoplasm of overlapping sites of right female breast: Secondary | ICD-10-CM | POA: Diagnosis not present

## 2024-03-01 MED ORDER — LORAZEPAM 0.5 MG PO TABS
ORAL_TABLET | ORAL | 0 refills | Status: AC
Start: 2024-03-01 — End: ?

## 2024-03-01 NOTE — Progress Notes (Signed)
 Patient ID: [name] [name], female    DOB: 11-02-1950, 73 y.o.   MRN: [id number]   Assessment & Plan:  Essential hypertension -     CBC with Differential/Platelet -     Comprehensive metabolic panel with GFR -     Lipid panel -     Hemoglobin A1c  Primary insomnia -     CBC with Differential/Platelet -     Comprehensive metabolic panel with GFR -     Lipid panel -     Hemoglobin A1c  Mixed hyperlipidemia -     Lipid panel  Screening for diabetes mellitus -     Comprehensive metabolic panel with GFR -     Hemoglobin A1c  Malignant neoplasm of overlapping sites of right breast in female, estrogen receptor positive (HCC)  Other orders -     LORazepam ; Take 1/2 tab at bedtime prn anxiety and insomnia.  Dispense: 30 tablet; Refill: 0    Assessment & Plan Breast Cancer (Stage 1A) Stage 1A right breast cancer diagnosed in October 2024. Completed surgery and chemotherapy. Currently on anastrozole , which she tolerates well. Mammogram due in October 2025. Comprehensive survivorship [name] plan discussed. Oncology follow-up scheduled for October. - Continue anastrozole  - Schedule mammogram in October 2025 - Follow up with oncology in October  Insomnia Difficulty sleeping, potentially exacerbated by cancer treatment. Previously used lorazepam  for situational anxiety and sleep. Discussed risks of lorazepam  including habit formation, dizziness, and fall risk. Prefers not to take daily medication for sleep. Discussed alternative sleep aids like melatonin, but she has tried it before without success. Offered Rozerem as a non-habit forming alternative, but she declined due to preference for as-needed medication. - Prescribe lorazepam  0.5 mg, 30 tablets, take half tablet as needed for anxiety or sleep - PDMP reviewed today, no red flags, filling appropriately.    Hypertension Chronic hypertension managed with amlodipine  and nebivolol . Blood pressure is well-controlled on current  medications. - Refill amlodipine  5 mg - Refill nebivolol  10 mg  Hyperlipidemia Chronic hyperlipidemia managed with rosuvastatin . Recent cholesterol panel not available, needs to be checked. - Refill rosuvastatin  10 mg - Order lipid panel   General Health Maintenance Up to date with colon cancer screening and dental visits. Eye exam due soon. No new skin changes or moles. No smoking or vaping. Discussed importance of physical activity. - Encourage regular physical activity - Schedule eye exam  Follow-up Follow-up needed to monitor sleep and overall health. Labs needed to check cholesterol and prediabetes markers due to recent body changes from cancer treatment. - Schedule follow-up appointment in 6 months - Order CBC, CMP, A1c, and lipid panel      Return in about 6 months (around 08/31/2024) for recheck/follow-up.    Subjective:    Chief Complaint  Patient presents with   Medication Refill    Pt in office today for medication refill;    Hypertension    Medication Refill  Hypertension   Discussed the use of AI scribe software for clinical note transcription with the patient, who gave verbal consent to proceed.  History of Present Illness [name] [name] is a 73 year old female with breast cancer who presents for follow-up [name].  She was diagnosed with breast cancer in October 2024 after discovering a lump during a self-examination. An initial mammogram in June 2024 was normal, but a subsequent mammogram confirmed the diagnosis. She underwent surgery and chemotherapy and is currently on anastrozole , which she tolerates well. No new skin  changes or moles are noted. She is up to date with colon cancer screening, having had a colonoscopy in July 2024. She no longer has her uterus or cervix and reports no vaginal changes or bleeding.  She has a history of osteopenia, confirmed by a bone density scan in February 2025. Her current medications include anastrozole , amlodipine  5  mg for hypertension, nebivolol  10 mg, and rosuvastatin  10 mg for hyperlipidemia. She has previously used lorazepam  for sleep issues, particularly since her cancer diagnosis, and reports difficulty sleeping, often staying awake until 4 AM. She has tried melatonin in the past without success.  She denies smoking or vaping and maintains regular dental and eye check-ups, with her last dental visit a month ago and her last eye exam less than a year ago. She does not engage in regular physical activity, citing lack of motivation and the heat as barriers. She prefers not to take daily medication for sleep, using lorazepam  only as needed for specific situations like anxiety-inducing events.     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 Edited by Joann Mu, MD   GERD (gastroesophageal reflux disease)    Headache(784.0)    Hypercholesterolemia    Hypertension    Idiopathic urticaria    Varicose veins of lower extremities     Past Surgical History:  Procedure Laterality Date   AUGMENTATION MAMMAPLASTY Bilateral    benign breast biopsy     bilateral breast implants     BREAST BIOPSY     unsure which breast and when   BREAST BIOPSY Right 07/03/2023   US  RT BREAST BX W LOC DEV EA ADD LESION IMG BX SPEC US  GUIDE 07/03/2023 [facility]   BREAST BIOPSY Right 07/03/2023   US  RT BREAST BX W LOC DEV 1ST LESION IMG BX SPEC US  GUIDE 07/03/2023 [facility]   BREAST BIOPSY Right 08/08/2023   US  RT RADIOACTIVE SEED EA ADD LESION 08/08/2023 [facility]   BREAST BIOPSY  08/08/2023   US  RT RADIOACTIVE SEED LOC 08/08/2023  [facility]   BREAST IMPLANT REMOVAL Bilateral 08/11/2023   Procedure: BILATERAL REMOVAL BREAST IMPLANTS;  Surgeon: Thornell Flirt, DO;  Location: [facility];  Service: Plastics;  Laterality: Bilateral;   BREAST LUMPECTOMY WITH RADIOACTIVE SEED LOCALIZATION Right 08/11/2023   Procedure: RIGHT BREAST RADIOACTIVE SEED LOCALIZED LUMPECTOMY x2;  Surgeon: Caralyn Chandler, MD;  Location: [facility];  Service: General;  Laterality: Right;   cataract right eye w/ lens implant     COLONOSCOPY  03/20/2023   COLONOSCOPY  2012   Dr Adan Holms, normal   excision of large anal polyp     MASTOPEXY Bilateral 08/11/2023   Procedure: MASTOPEXY;  Surgeon: Thornell Flirt, DO;  Location: [facility];  Service: Plastics;  Laterality: Bilateral;   PERIPHERAL VASCULAR CATHETERIZATION N/A 02/11/2016   Procedure: Renal Angiography;  Surgeon: Arvil Lauber, MD;  Location: [facility];  Service: Cardiovascular;  Laterality: N/A;   PLACEMENT OF BREAST IMPLANTS Bilateral 08/11/2023   Procedure: PLACEMENT OF SILCONE IMPLANTS;  Surgeon: Thornell Flirt, DO;  Location:   SURGERY CENTER;  Service: Plastics;  Laterality: Bilateral;   POLYPECTOMY     VAGINAL HYSTERECTOMY     varicose vein      Family History  Problem Relation Age of Onset   Hypertension Mother    Hyperlipidemia Mother    Diabetes Mother    Breast cancer Mother 43   Pulmonary embolism Mother    Deep vein thrombosis Mother    Pancreatic cancer Mother 6   Dementia Father    Leukemia Maternal Aunt    Sickle cell anemia Cousin    Colon cancer Neg Hx    Esophageal cancer Neg Hx    Stomach cancer Neg Hx    Colon polyps Neg Hx    Rectal cancer Neg Hx     Social History   Tobacco Use   Smoking status: Never   Smokeless tobacco: Never  Vaping Use   Vaping status: Never Used  Substance Use Topics   Alcohol use: No    Alcohol/week: 0.0 standard drinks of alcohol    Drug use: No     No Known Allergies  Review of Systems NEGATIVE UNLESS OTHERWISE INDICATED IN HPI      Objective:     BP 118/70 (BP Location: Left Arm, Patient Position: Sitting, Cuff Size: Normal)   Pulse (!) 59   Temp (!) 97.3 F (36.3 C) (Temporal)   Ht 5' 2 (1.575 m)   Wt 136 lb 4 oz (61.8 kg)   LMP  (LMP Unknown)   SpO2 99%   BMI 24.92 kg/m   Wt Readings from Last 3 Encounters:  03/01/24 136 lb 4 oz (61.8 kg)  01/23/24 139 lb 14.4 oz (63.5 kg)  10/31/23 138 lb 11.2 oz (62.9 kg)    BP Readings from Last 3 Encounters:  03/01/24 118/70  01/23/24 (!) 140/66  10/31/23 (!) 142/65     Physical Exam Vitals and nursing note reviewed.  Constitutional:      Appearance: Normal appearance. She is normal weight. She is not toxic-appearing.  HENT:     Right Ear: External ear normal.     Left Ear: External ear normal.   Eyes:     Extraocular Movements: Extraocular movements intact.     Conjunctiva/sclera: Conjunctivae normal.     Pupils: Pupils are equal, round, and reactive to light.    Cardiovascular:     Rate and Rhythm: Regular rhythm. Bradycardia present.     Pulses: Normal pulses.     Heart sounds: Normal heart sounds.  Pulmonary:     Effort: Pulmonary effort is normal.     Breath sounds: Normal breath sounds.   Musculoskeletal:        General: Normal range of motion.     Cervical back: Normal range of motion and neck supple.     Right lower leg: No edema.     Left lower leg: No edema.   Skin:    General: Skin is warm and dry.   Neurological:     General: No focal deficit present.     Mental Status: She is alert and oriented to person, place, and time.   Psychiatric:        Mood and Affect: Mood normal.        Behavior: Behavior normal.             Nainoa Woldt M Ashante Snelling, PA-C

## 2024-03-01 NOTE — Patient Instructions (Addendum)
 Labs today  Please work on the following to help with sleep:  -Sleep only long enough to feel rested then get out of bed -Go to bed and get up at the same time every day. -Do not try to force yourself to sleep. If you can't sleep, get out of bed adn try again later. -Have coffee, tea, and other foods that have caffeine only in the morning. -Avoid alcohol -Keep your bedroom dark, cool, quiet, and free of reminders of work or other things that cause you stress -Exercise several days a week, but not right before bed -Avoid looking at phones or reading devices (e-books) that give off light before bed. This can make it harder to fall asleep  We discussed Lorazepam  and restarting this medication. You are aware of possible risks with this medication. Please take only as directed.

## 2024-03-02 LAB — COMPREHENSIVE METABOLIC PANEL WITH GFR
ALT: 29 IU/L (ref 0–32)
AST: 34 IU/L (ref 0–40)
Albumin: 4.3 g/dL (ref 3.9–4.9)
Alkaline Phosphatase: 101 IU/L (ref 44–121)
BUN/Creatinine Ratio: 15 (ref 12–28)
BUN: 19 mg/dL (ref 8–27)
Bilirubin Total: 0.3 mg/dL (ref 0.0–1.2)
CO2: 24 mmol/L (ref 20–29)
Calcium: 9.9 mg/dL (ref 8.7–10.3)
Chloride: 103 mmol/L (ref 96–106)
Creatinine, Ser: 1.23 mg/dL — ABNORMAL HIGH (ref 0.57–1.00)
Globulin, Total: 2.8 g/dL (ref 1.5–4.5)
Glucose: 84 mg/dL (ref 70–99)
Potassium: 3.7 mmol/L (ref 3.5–5.2)
Sodium: 143 mmol/L (ref 134–144)
Total Protein: 7.1 g/dL (ref 6.0–8.5)
eGFR: 47 mL/min/{1.73_m2} — ABNORMAL LOW (ref >59)

## 2024-03-02 LAB — CBC WITH DIFFERENTIAL/PLATELET
Basophils Absolute: 0 10*3/uL (ref 0.0–0.2)
Basos: 1 %
EOS (ABSOLUTE): 0.1 10*3/uL (ref 0.0–0.4)
Eos: 2 %
Hematocrit: 36.7 % (ref 34.0–46.6)
Hemoglobin: 12 g/dL (ref 11.1–15.9)
Immature Grans (Abs): 0 10*3/uL (ref 0.0–0.1)
Immature Granulocytes: 0 %
Lymphocytes Absolute: 0.7 10*3/uL (ref 0.7–3.1)
Lymphs: 14 %
MCH: 29.3 pg (ref 26.6–33.0)
MCHC: 32.7 g/dL (ref 31.5–35.7)
MCV: 90 fL (ref 79–97)
Monocytes Absolute: 0.5 10*3/uL (ref 0.1–0.9)
Monocytes: 11 %
Neutrophils Absolute: 3.4 10*3/uL (ref 1.4–7.0)
Neutrophils: 72 %
Platelets: 250 10*3/uL (ref 150–450)
RBC: 4.1 x10E6/uL (ref 3.77–5.28)
RDW: 13.3 % (ref 11.7–15.4)
WBC: 4.7 10*3/uL (ref 3.4–10.8)

## 2024-03-02 LAB — LIPID PANEL
Chol/HDL Ratio: 2 ratio (ref 0.0–4.4)
Cholesterol, Total: 146 mg/dL (ref 100–199)
HDL: 74 mg/dL (ref >39)
LDL Chol Calc (NIH): 59 mg/dL (ref 0–99)
Triglycerides: 64 mg/dL (ref 0–149)
VLDL Cholesterol Cal: 13 mg/dL (ref 5–40)

## 2024-03-02 LAB — HEMOGLOBIN A1C
Est. average glucose Bld gHb Est-mCnc: 123 mg/dL
Hgb A1c MFr Bld: 5.9 % — ABNORMAL HIGH (ref 4.8–5.6)

## 2024-03-03 ENCOUNTER — Ambulatory Visit: Payer: Self-pay | Admitting: Physician Assistant

## 2024-03-03 DIAGNOSIS — N289 Disorder of kidney and ureter, unspecified: Secondary | ICD-10-CM

## 2024-03-04 NOTE — Telephone Encounter (Signed)
 Spoke with patient about results and notes from PCP. Patient acknowledged understanding with no questions or concerns. Set up appt for lab.

## 2024-03-06 ENCOUNTER — Other Ambulatory Visit: Payer: Self-pay | Admitting: Physician Assistant

## 2024-03-06 DIAGNOSIS — Z23 Encounter for immunization: Secondary | ICD-10-CM | POA: Diagnosis not present

## 2024-03-07 ENCOUNTER — Telehealth: Payer: Self-pay | Admitting: *Deleted

## 2024-03-07 MED ORDER — NEBIVOLOL HCL 10 MG PO TABS: ORAL_TABLET | ORAL | 1 refills | Status: DC

## 2024-03-07 NOTE — Telephone Encounter (Signed)
 Copied from CRM [id number]. Topic: Clinical - Prescription Issue >> Mar 06, 2024  4:33 PM Chrystal Crape R wrote: Pt is calling to check on the status of her nebivolol  (BYSTOLIC ) 10 MG tablet [[id number]]

## 2024-03-08 ENCOUNTER — Other Ambulatory Visit

## 2024-03-19 ENCOUNTER — Ambulatory Visit (INDEPENDENT_AMBULATORY_CARE_PROVIDER_SITE_OTHER): Payer: Medicare Other | Admitting: Plastic Surgery

## 2024-03-19 ENCOUNTER — Encounter: Payer: Self-pay | Admitting: Plastic Surgery

## 2024-03-19 VITALS — BP 109/64 | HR 68 | Ht 62.0 in | Wt 135.2 lb

## 2024-03-19 DIAGNOSIS — C50811 Malignant neoplasm of overlapping sites of right female breast: Secondary | ICD-10-CM | POA: Diagnosis not present

## 2024-03-19 DIAGNOSIS — Z170 Estrogen receptor positive status [ER+]: Secondary | ICD-10-CM | POA: Diagnosis not present

## 2024-03-19 NOTE — Progress Notes (Signed)
   Subjective:    Patient ID: Lawrnce JONELLE [name], female    DOB: 02/17/1951, 73 y.o.   MRN: [id number]  The patient is a 74 year old female here for follow-up after undergoing surgery in November 2024.  She had bilateral removal of breast implants with placement of silicone implants and capsulotomies.  She has Mentor smooth round high profile gel 200 cc implants in on both sides. The implants and breasts are soft.  No signs or issues noted.  Patient is happy.     Review of Systems  Constitutional: Negative.   HENT: Negative.    Eyes: Negative.   Respiratory: Negative.    Cardiovascular: Negative.   Gastrointestinal: Negative.   Endocrine: Negative.   Genitourinary: Negative.   Musculoskeletal: Negative.        Objective:   Physical Exam Vitals reviewed.  HENT:     Head: Atraumatic.   Cardiovascular:     Rate and Rhythm: Normal rate.     Pulses: Normal pulses.  Pulmonary:     Effort: Pulmonary effort is normal.   Skin:    General: Skin is warm.     Capillary Refill: Capillary refill takes less than 2 seconds.   Neurological:     Mental Status: She is alert and oriented to person, place, and time.   Psychiatric:        Mood and Affect: Mood normal.        Behavior: Behavior normal.        Thought Content: Thought content normal.        Judgment: Judgment normal.        Assessment & Plan:     ICD-10-CM   1. Malignant neoplasm of overlapping sites of right breast in female, estrogen receptor positive (HCC)  C50.811    Z17.0       Pictures were obtained of the patient and placed in the chart with the patient's or guardian's permission.  Plan follow-up in 1 year.  Patient is scheduled for mammograms at the end of this year so continue with that and then we will keep an eye on that.

## 2024-04-01 DIAGNOSIS — C50211 Malignant neoplasm of upper-inner quadrant of right female breast: Secondary | ICD-10-CM | POA: Diagnosis not present

## 2024-04-01 DIAGNOSIS — Z170 Estrogen receptor positive status [ER+]: Secondary | ICD-10-CM | POA: Diagnosis not present

## 2024-05-11 ENCOUNTER — Other Ambulatory Visit: Payer: Self-pay | Admitting: Physician Assistant

## 2024-05-16 DIAGNOSIS — Z01419 Encounter for gynecological examination (general) (routine) without abnormal findings: Secondary | ICD-10-CM | POA: Diagnosis not present

## 2024-06-04 DIAGNOSIS — H35413 Lattice degeneration of retina, bilateral: Secondary | ICD-10-CM | POA: Diagnosis not present

## 2024-07-02 ENCOUNTER — Ambulatory Visit
Admission: RE | Admit: 2024-07-02 | Discharge: 2024-07-02 | Disposition: A | Discharge status: Home / Self Care | Source: Ambulatory Visit | Attending: Adult Health | Admitting: Adult Health

## 2024-07-02 ENCOUNTER — Other Ambulatory Visit: Payer: Self-pay | Admitting: Adult Health

## 2024-07-02 DIAGNOSIS — Z170 Estrogen receptor positive status [ER+]: Secondary | ICD-10-CM

## 2024-07-02 DIAGNOSIS — R928 Other abnormal and inconclusive findings on diagnostic imaging of breast: Secondary | ICD-10-CM | POA: Diagnosis not present

## 2024-07-02 HISTORY — DX: Personal history of irradiation: Z92.3

## 2024-07-03 ENCOUNTER — Telehealth: Payer: Self-pay

## 2024-07-03 NOTE — Telephone Encounter (Signed)
 Spoke with patient and confirmed appointment on 10/16

## 2024-07-04 ENCOUNTER — Inpatient Hospital Stay: Payer: Medicare Other | Attending: Hematology and Oncology | Admitting: Hematology and Oncology

## 2024-07-04 VITALS — BP 115/58 | HR 71 | Temp 97.3°F | Resp 16 | Wt 129.6 lb

## 2024-07-04 DIAGNOSIS — Z17411 Hormone receptor positive with human epidermal growth factor receptor 2 negative status: Secondary | ICD-10-CM | POA: Insufficient documentation

## 2024-07-04 DIAGNOSIS — R42 Dizziness and giddiness: Secondary | ICD-10-CM | POA: Insufficient documentation

## 2024-07-04 DIAGNOSIS — Z170 Estrogen receptor positive status [ER+]: Secondary | ICD-10-CM

## 2024-07-04 DIAGNOSIS — R630 Anorexia: Secondary | ICD-10-CM | POA: Insufficient documentation

## 2024-07-04 DIAGNOSIS — F19982 Other psychoactive substance use, unspecified with psychoactive substance-induced sleep disorder: Secondary | ICD-10-CM

## 2024-07-04 DIAGNOSIS — C50811 Malignant neoplasm of overlapping sites of right female breast: Secondary | ICD-10-CM | POA: Diagnosis present

## 2024-07-04 DIAGNOSIS — Z79899 Other long term (current) drug therapy: Secondary | ICD-10-CM | POA: Diagnosis not present

## 2024-07-04 DIAGNOSIS — R634 Abnormal weight loss: Secondary | ICD-10-CM | POA: Insufficient documentation

## 2024-07-04 DIAGNOSIS — Z860100 Personal history of colon polyps, unspecified: Secondary | ICD-10-CM | POA: Diagnosis not present

## 2024-07-04 DIAGNOSIS — G4700 Insomnia, unspecified: Secondary | ICD-10-CM | POA: Insufficient documentation

## 2024-07-04 DIAGNOSIS — Z79811 Long term (current) use of aromatase inhibitors: Secondary | ICD-10-CM | POA: Insufficient documentation

## 2024-07-04 NOTE — Progress Notes (Signed)
 BRIEF ONCOLOGIC HISTORY:  Oncology History  Malignant neoplasm of overlapping sites of right breast in female, estrogen receptor positive (HCC)  06/29/2023 Mammogram   Mammogram October 2024 showed right breast mass at 1:00 at the palpable site of concern 4 cm from the nipple measuring 7 x 5 x 6 mm.  Adjacent to this is a complex circumscribed mass measuring 1.6 x 1.1 x 1.2 cm.  Ultrasound of the right axilla demonstrated normal lymph nodes.   07/03/2023 Pathology Results   Right breast needle core biopsy showed invasive mammary carcinoma with mucinous features, DCIS with cribriform pattern and intermediate grade, overall grade of the invasive, (2, prognostics showed ER 9089% strong staining, PR 99% positive strong staining, HER2 0 and Ki-67 of 10%   07/10/2023 Initial Diagnosis   Malignant neoplasm of upper-inner quadrant of right breast in female, estrogen receptor positive (HCC)   07/10/2023 Cancer Staging   Staging form: Breast, AJCC 8th Edition - Clinical: Stage IA (cT1c, cN0, cM0, G2, ER+, PR+, HER2-) - Signed by Lanell Donald Stagger, PA-C on 07/10/2023 Method of lymph node assessment: Clinical Histologic grading system: 3 grade system    Genetic Testing   [facility] CancerNext Panel+RNA was Negative. Report date is 07/27/2023.   The CancerNext gene panel offered by [employer] includes sequencing, rearrangement analysis, and RNA analysis for the following 34 genes:   APC, ATM, AXIN2, BARD1, BMPR1A, BRCA1, BRCA2, BRIP1, CDH1, CDK4, CDKN2A, CHEK2, DICER1, HOXB13, EPCAM, GREM1, MLH1, MSH2, MSH3, MSH6, MUTYH, NF1, NTHL1, PALB2, PMS2, POLD1, POLE, PTEN, RAD51C, RAD51D, SMAD4, SMARCA4, STK11, and TP53.    08/11/2023 Definitive Surgery   A. BREAST, RIGHT W/SEEDS, LUMPECTOMY:  Invasive ductal carcinoma with extracellular mucin, two tumors, 1.9 and  0.5 cm, grade 2  Ductal carcinoma in situ: Cribriform, intermediate nuclear grade  Margins, invasive:- Negative      Closest, invasive: 1  mm, anterior (coil clip)  Margins, DCIS: Negative      Closest, DCIS: 2 mm, inferior  Lymphovascular invasion: Not identified  Prognostic markers:  ER positive, PR positive, Her2 negative, Ki-67 10%  Other: Atypical ductal hyperplasia and intraductal papillomas  See oncology table   B. BREAST, RIGHT ADDITIONAL INFERIOR MEDIAL MARGIN, EXCISION:  Ductal carcinoma in situ, intermediate grade  Atypical ductal hyperplasia and intraductal papillomas  DCIS 2 mm from inferior margin     08/11/2023 Oncotype testing   14/4%, <1% chemo benefit   08/11/2023 Cancer Staging   Staging form: Breast, AJCC 8th Edition - Pathologic stage from 08/11/2023: Stage IA (pT1c, pN0, cM0, G2, ER+, PR+, HER2-) - Signed by Crawford Morna Pickle, NP on 09/18/2023 Stage prefix: Initial diagnosis Histologic grading system: 3 grade system   10/12/2023 - 11/27/2023 Radiation Therapy   Plan Name: Breast_R Site: Breast, Right Technique: 3D Mode: Photon Dose Per Fraction: 1.8 Gy Prescribed Dose (Delivered / Prescribed): 50.4 Gy / 50.4 Gy Prescribed Fxs (Delivered / Prescribed): 28 / 28   Plan Name: Breast_R_Bst Site: Breast, Right Technique: Electron Mode: Electron Dose Per Fraction: 2 Gy Prescribed Dose (Delivered / Prescribed): 10 Gy / 10 Gy Prescribed Fxs (Delivered / Prescribed): 5 / 5   12/2023 -  Anti-estrogen oral therapy   Anastrozole      INTERVAL HISTORY:   Discussed the use of AI scribe software for clinical note transcription with the patient, who gave verbal consent to proceed.  History of Present Illness [name] [name] is a 73 year old female with breast cancer who presents with sleep disturbances and dizziness.  She  has been experiencing significant sleep disturbances since starting anastrozole  approximately six months ago, describing being 'wide awake' until two or three in the morning and only sleeping for about three hours per night. She does not take naps during the day and has not  tried any sleep aids like melatonin. She is unsure if the medication is taken in the morning or at night.  A recent mammogram performed two days ago was reported to show post-treatment changes in the right breast. She has not been exercising but has purchased a treadmill. She reports weight loss, currently weighing 129 pounds, and attributes this to a lack of appetite and dietary restrictions.   She experiences intermittent sensations of 'needles' in her back, which are not associated with soreness and occur sporadically.  She reports episodes of dizziness, particularly when standing up or walking, describing it as a 'drop' sensation. She is concerned about the risk of falling during these episodes. She is on two blood pressure medications, including amlodipine . She has mild kidney impairment noted in previous labs, with mildly elevated creatinine levels observed since at least a year ago.  Rest of the pertinent 10 point ROS reviewed and neg   PAST MEDICAL/SURGICAL HISTORY:  Past Medical History:  Diagnosis Date   Anxiety    Breast cancer (HCC)    Colonic polyp    Fibromuscular dysplasia of bilateral renal arteries    FMD (facioscapulohumeral muscular dystrophy)  appears to have been entered by mistake in 01/2016. 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 Edited by Darlyn, MD   GERD (gastroesophageal reflux disease)    Headache(784.0)    Hypercholesterolemia    Hypertension    Idiopathic urticaria    Personal history of radiation therapy    Varicose veins of lower extremities    Past Surgical History:  Procedure Laterality Date   AUGMENTATION MAMMAPLASTY Bilateral    benign breast biopsy     bilateral breast implants     BREAST BIOPSY     unsure which breast and when   BREAST BIOPSY Right 07/03/2023   US  RT BREAST BX W LOC DEV EA ADD LESION IMG BX SPEC US   GUIDE 07/03/2023 [facility]   BREAST BIOPSY Right 07/03/2023   US  RT BREAST BX W LOC DEV 1ST LESION IMG BX SPEC US  GUIDE 07/03/2023 [facility]   BREAST BIOPSY Right 08/08/2023   US  RT RADIOACTIVE SEED EA ADD LESION 08/08/2023 [facility]   BREAST BIOPSY  08/08/2023   US  RT RADIOACTIVE SEED LOC 08/08/2023 [facility]   BREAST IMPLANT REMOVAL Bilateral 08/11/2023   Procedure: BILATERAL REMOVAL BREAST IMPLANTS;  Surgeon: Lowery Estefana RAMAN, DO;  Location: [facility];  Service: Plastics;  Laterality: Bilateral;   BREAST LUMPECTOMY     BREAST LUMPECTOMY WITH RADIOACTIVE SEED LOCALIZATION Right 08/11/2023   Procedure: RIGHT BREAST RADIOACTIVE SEED LOCALIZED LUMPECTOMY x2;  Surgeon: Curvin Deward MOULD, MD;  Location: [facility];  Service: General;  Laterality: Right;   cataract right eye w/ lens implant     COLONOSCOPY  03/20/2023   COLONOSCOPY  2012   Dr Jakie, normal   excision of large anal polyp     MASTOPEXY Bilateral 08/11/2023   Procedure: MASTOPEXY;  Surgeon: Lowery Estefana RAMAN, DO;  Location: [facility];  Service: Plastics;  Laterality: Bilateral;   PERIPHERAL VASCULAR CATHETERIZATION N/A 02/11/2016   Procedure: Renal  Angiography;  Surgeon: Redell LITTIE Door, MD;  Location: [facility];  Service: Cardiovascular;  Laterality: N/A;   PLACEMENT OF BREAST IMPLANTS Bilateral 08/11/2023   Procedure: PLACEMENT OF SILCONE IMPLANTS;  Surgeon: Lowery Estefana RAMAN, DO;  Location: [facility];  Service: Plastics;  Laterality: Bilateral;   POLYPECTOMY     VAGINAL HYSTERECTOMY     varicose vein       ALLERGIES:  No Known Allergies   CURRENT MEDICATIONS:  Outpatient Encounter Medications as of 07/04/2024  Medication Sig   amLODipine  (NORVASC ) 5 MG tablet TAKE 1 TABLET BY MOUTH EVERY DAY   anastrozole  (ARIMIDEX ) 1 MG tablet Take 1 tablet (1 mg total) by mouth daily.   aspirin  EC 81 MG tablet Take  81 mg by mouth daily.   BIOTIN PO Take by mouth.   cholecalciferol (VITAMIN D ) 1000 units tablet Take 1,000 Units by mouth daily.   LORazepam  (ATIVAN ) 0.5 MG tablet Take 1/2 tab at bedtime prn anxiety and insomnia.   nebivolol  (BYSTOLIC ) 10 MG tablet TAKE 1 TABLET BY MOUTH EVERY DAY   rosuvastatin  (CRESTOR ) 10 MG tablet TAKE 1 TABLET BY MOUTH EVERY DAY   vitamin E 400 UNIT capsule Take 400 Units by mouth daily.   No facility-administered encounter medications on file as of 07/04/2024.     ONCOLOGIC FAMILY HISTORY:  Family History  Problem Relation Age of Onset   Hypertension Mother    Hyperlipidemia Mother    Diabetes Mother    Breast cancer Mother 18   Pulmonary embolism Mother    Deep vein thrombosis Mother    Pancreatic cancer Mother 43   Dementia Father    Leukemia Maternal Aunt    Sickle cell anemia Cousin    Colon cancer Neg Hx    Esophageal cancer Neg Hx    Stomach cancer Neg Hx    Colon polyps Neg Hx    Rectal cancer Neg Hx      SOCIAL HISTORY:  Social History   Socioeconomic History   Marital status: Single    Spouse name: Not on file   Number of children: 0   Years of education: 12   Highest education level: Not on file  Occupational History    Comment: [employer]  Tobacco Use   Smoking status: Never   Smokeless tobacco: Never  Vaping Use   Vaping status: Never Used  Substance and Sexual Activity   Alcohol use: No    Alcohol/week: 0.0 standard drinks of alcohol   Drug use: No   Sexual activity: Not on file  Other Topics Concern   Not on file  Social History Narrative   Lives alone   No caffeine use   Social Drivers of Corporate investment banker Strain: Low Risk  (08/29/2023)   Overall Financial Resource Strain (CARDIA)    Difficulty of Paying Living Expenses: Not hard at all  Food Insecurity: No Food Insecurity (08/29/2023)   Hunger Vital Sign    Worried About Running Out of Food in the Last Year: Never true    Ran Out of Food in the Last Year:  Never true  Transportation Needs: No Transportation Needs (08/29/2023)   PRAPARE - Administrator, Civil Service (Medical): No    Lack of Transportation (Non-Medical): No  Physical Activity: Inactive (08/29/2023)   Exercise Vital Sign    Days of Exercise per Week: 0 days    Minutes of Exercise per Session: 0 min  Stress: No Stress  Concern Present (08/29/2023)   [employer] of Occupational Health - Occupational Stress Questionnaire    Feeling of Stress : Not at all  Social Connections: Socially Isolated (08/29/2023)   Social Connection and Isolation Panel    Frequency of Communication with Friends and Family: More than three times a week    Frequency of Social Gatherings with Friends and Family: Once a week    Attends Religious Services: Never    Database administrator or Organizations: No    Attends Banker Meetings: Never    Marital Status: Never married  Intimate Partner Violence: Not At Risk (08/29/2023)   Humiliation, Afraid, Rape, and Kick questionnaire    Fear of Current or Ex-Partner: No    Emotionally Abused: No    Physically Abused: No    Sexually Abused: No     OBSERVATIONS/OBJECTIVE:  BP (!) 115/58 (BP Location: Left Arm, Patient Position: Sitting)   Pulse 71   Temp (!) 97.3 F (36.3 C) (Temporal)   Resp 16   Wt 129 lb 9.6 oz (58.8 kg)   LMP  (LMP Unknown)   SpO2 100%   BMI 23.70 kg/m   GENERAL: Patient is a well appearing female in no acute distress HEENT:  Sclerae anicteric.  Oropharynx clear and moist. No ulcerations or evidence of oropharyngeal candidiasis. Neck is supple.  NODES:  No cervical, supraclavicular, or axillary lymphadenopathy palpated.  BREAST EXAM: Right breast status postlumpectomy and radiation no sign of local recurrence left breast is benign. CTA bilaterally, RRR No LE edema.  LABORATORY DATA:  None for this visit.  DIAGNOSTIC IMAGING:  None for this visit.      ASSESSMENT AND PLAN:  Ms.. Belanger is  a pleasant 73 y.o. female with Stage 1A right breast invasive ductal carcinoma, ER+/PR+/HER2-, diagnosed in 06/2023, treated with lumpectomy, adjuvant radiation therapy, and anti-estrogen therapy with anastrozole  beginning in 12/2023.   Assessment & Plan Right breast cancer, no current evidence of disease Recent mammogram showed post-treatment changes with no malignancy. On anastrozole  for 6 months. - Continue anastrozole  therapy for a total duration of 5 yrs.  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 She declined sleep aids. - Advised that sleep pattern changes may improve after several months of use. - Offered alternative anti estrogen therapy, she declined.  Unintentional weight loss Weight at 129 lbs, decreased appetite,  - Monitor weight and dietary intake.  Dizziness likely orthostatic, possibly related to antihypertensive therapy Dizziness with positional changes, possibly related to antihypertensive therapy. Blood pressure 115/58. - Ensure adequate hydration and avoid sudden positional changes. - Reduce amlodipine  to 2.5 mg if dizziness persists.  Chronic mild kidney impairment Mild creatinine impairment consistent with previous results, no significant changes. - Primary care is monitoring the condition.  Intermittent back paresthesia Intermittent needle sensation in back, likely nerve-related and positional. - Monitor symptoms.  Time spent: 40 min  *Total Encounter Time as defined by the Centers for Medicare and Medicaid Services includes, in addition to the face-to-face time of a patient visit (documented in the note above) non-face-to-face time: obtaining and reviewing outside history, ordering and reviewing medications, tests or procedures, care coordination (communications with other health care professionals or caregivers) and documentation in the medical record.

## 2024-07-04 NOTE — Progress Notes (Signed)
 BRIEF ONCOLOGIC HISTORY:  Oncology History  Malignant neoplasm of overlapping sites of right breast in female, estrogen receptor positive (HCC)  06/29/2023 Mammogram   Mammogram October 2024 showed right breast mass at 1:00 at the palpable site of concern 4 cm from the nipple measuring 7 x 5 x 6 mm.  Adjacent to this is a complex circumscribed mass measuring 1.6 x 1.1 x 1.2 cm.  Ultrasound of the right axilla demonstrated normal lymph nodes.   07/03/2023 Pathology Results   Right breast needle core biopsy showed invasive mammary carcinoma with mucinous features, DCIS with cribriform pattern and intermediate grade, overall grade of the invasive, (2, prognostics showed ER 9089% strong staining, PR 99% positive strong staining, HER2 0 and Ki-67 of 10%   07/10/2023 Initial Diagnosis   Malignant neoplasm of upper-inner quadrant of right breast in female, estrogen receptor positive (HCC)   07/10/2023 Cancer Staging   Staging form: Breast, AJCC 8th Edition - Clinical: Stage IA (cT1c, cN0, cM0, G2, ER+, PR+, HER2-) - Signed by Lanell Donald Stagger, PA-C on 07/10/2023 Method of lymph node assessment: Clinical Histologic grading system: 3 grade system    Genetic Testing   [facility] CancerNext Panel+RNA was Negative. Report date is 07/27/2023.   The CancerNext gene panel offered by [employer] includes sequencing, rearrangement analysis, and RNA analysis for the following 34 genes:   APC, ATM, AXIN2, BARD1, BMPR1A, BRCA1, BRCA2, BRIP1, CDH1, CDK4, CDKN2A, CHEK2, DICER1, HOXB13, EPCAM, GREM1, MLH1, MSH2, MSH3, MSH6, MUTYH, NF1, NTHL1, PALB2, PMS2, POLD1, POLE, PTEN, RAD51C, RAD51D, SMAD4, SMARCA4, STK11, and TP53.    08/11/2023 Definitive Surgery   A. BREAST, RIGHT W/SEEDS, LUMPECTOMY:  Invasive ductal carcinoma with extracellular mucin, two tumors, 1.9 and  0.5 cm, grade 2  Ductal carcinoma in situ: Cribriform, intermediate nuclear grade  Margins, invasive:- Negative      Closest, invasive: 1 mm,  anterior (coil clip)  Margins, DCIS: Negative      Closest, DCIS: 2 mm, inferior  Lymphovascular invasion: Not identified  Prognostic markers:  ER positive, PR positive, Her2 negative, Ki-67 10%  Other: Atypical ductal hyperplasia and intraductal papillomas  See oncology table   B. BREAST, RIGHT ADDITIONAL INFERIOR MEDIAL MARGIN, EXCISION:  Ductal carcinoma in situ, intermediate grade  Atypical ductal hyperplasia and intraductal papillomas  DCIS 2 mm from inferior margin     08/11/2023 Oncotype testing   14/4%, <1% chemo benefit   08/11/2023 Cancer Staging   Staging form: Breast, AJCC 8th Edition - Pathologic stage from 08/11/2023: Stage IA (pT1c, pN0, cM0, G2, ER+, PR+, HER2-) - Signed by Crawford Morna Pickle, NP on 09/18/2023 Stage prefix: Initial diagnosis Histologic grading system: 3 grade system   10/12/2023 - 11/27/2023 Radiation Therapy   Plan Name: Breast_R Site: Breast, Right Technique: 3D Mode: Photon Dose Per Fraction: 1.8 Gy Prescribed Dose (Delivered / Prescribed): 50.4 Gy / 50.4 Gy Prescribed Fxs (Delivered / Prescribed): 28 / 28   Plan Name: Breast_R_Bst Site: Breast, Right Technique: Electron Mode: Electron Dose Per Fraction: 2 Gy Prescribed Dose (Delivered / Prescribed): 10 Gy / 10 Gy Prescribed Fxs (Delivered / Prescribed): 5 / 5   12/2023 -  Anti-estrogen oral therapy   Anastrozole      INTERVAL HISTORY:      REVIEW OF SYSTEMS:  Review of Systems  Constitutional:  Negative for appetite change, chills, fatigue, fever and unexpected weight change.  HENT:   Negative for hearing loss, lump/mass and trouble swallowing.   Eyes:  Negative for eye problems and icterus.  Respiratory:  Negative for chest tightness, cough and shortness of breath.   Cardiovascular:  Negative for chest pain, leg swelling and palpitations.  Gastrointestinal:  Negative for abdominal distention, abdominal pain, constipation, diarrhea, nausea and vomiting.  Endocrine:  Negative for hot flashes.  Genitourinary:  Negative for difficulty urinating.   Musculoskeletal:  Negative for arthralgias.  Skin:  Negative for itching and rash.  Neurological:  Negative for dizziness, extremity weakness, headaches and numbness.  Hematological:  Negative for adenopathy. Does not bruise/bleed easily.  Psychiatric/Behavioral:  Negative for depression. The patient is not nervous/anxious.    Breast: Denies any new nodularity, masses, tenderness, nipple changes, or nipple discharge.       PAST MEDICAL/SURGICAL HISTORY:  Past Medical History:  Diagnosis Date   Anxiety    Breast cancer [facility])    Colonic polyp    Fibromuscular dysplasia of bilateral renal arteries    FMD (facioscapulohumeral muscular dystrophy)  appears to have been entered by mistake in 01/2016. 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 Edited by Darlyn, MD   GERD (gastroesophageal reflux disease)    Headache(784.0)    Hypercholesterolemia    Hypertension    Idiopathic urticaria    Personal history of radiation therapy    Varicose veins of lower extremities    Past Surgical History:  Procedure Laterality Date   AUGMENTATION MAMMAPLASTY Bilateral    benign breast biopsy     bilateral breast implants     BREAST BIOPSY     unsure which breast and when   BREAST BIOPSY Right 07/03/2023   US  RT BREAST BX W LOC DEV EA ADD LESION IMG BX SPEC US  GUIDE 07/03/2023 [facility]   BREAST BIOPSY Right 07/03/2023   US  RT BREAST BX W LOC DEV 1ST LESION IMG BX SPEC US  GUIDE 07/03/2023 [facility]   BREAST BIOPSY Right 08/08/2023   US  RT RADIOACTIVE SEED EA ADD LESION 08/08/2023 [facility]   BREAST BIOPSY  08/08/2023   US  RT RADIOACTIVE SEED LOC 08/08/2023 [facility]   BREAST IMPLANT REMOVAL Bilateral 08/11/2023   Procedure: BILATERAL REMOVAL BREAST IMPLANTS;   Surgeon: Lowery Estefana RAMAN, DO;  Location: [facility];  Service: Plastics;  Laterality: Bilateral;   BREAST LUMPECTOMY     BREAST LUMPECTOMY WITH RADIOACTIVE SEED LOCALIZATION Right 08/11/2023   Procedure: RIGHT BREAST RADIOACTIVE SEED LOCALIZED LUMPECTOMY x2;  Surgeon: Curvin Deward MOULD, MD;  Location: [facility];  Service: General;  Laterality: Right;   cataract right eye w/ lens implant     COLONOSCOPY  03/20/2023   COLONOSCOPY  2012   Dr Jakie, normal   excision of large anal polyp     MASTOPEXY Bilateral 08/11/2023   Procedure: MASTOPEXY;  Surgeon: Lowery Estefana RAMAN, DO;  Location: [facility];  Service: Plastics;  Laterality: Bilateral;   PERIPHERAL VASCULAR CATHETERIZATION N/A 02/11/2016   Procedure: Renal Angiography;  Surgeon: Redell LITTIE Door, MD;  Location: [facility] INVASIVE CV LAB;  Service: Cardiovascular;  Laterality: N/A;   PLACEMENT OF BREAST IMPLANTS Bilateral 08/11/2023   Procedure: PLACEMENT OF SILCONE IMPLANTS;  Surgeon: Lowery Estefana RAMAN, DO;  Location: [facility];  Service: Plastics;  Laterality: Bilateral;   POLYPECTOMY     VAGINAL HYSTERECTOMY     varicose vein       ALLERGIES:  No Known Allergies   CURRENT MEDICATIONS:  Outpatient Encounter  Medications as of 07/04/2024  Medication Sig   amLODipine  (NORVASC ) 5 MG tablet TAKE 1 TABLET BY MOUTH EVERY DAY   anastrozole  (ARIMIDEX ) 1 MG tablet Take 1 tablet (1 mg total) by mouth daily.   aspirin  EC 81 MG tablet Take 81 mg by mouth daily.   BIOTIN PO Take by mouth.   cholecalciferol (VITAMIN D ) 1000 units tablet Take 1,000 Units by mouth daily.   LORazepam  (ATIVAN ) 0.5 MG tablet Take 1/2 tab at bedtime prn anxiety and insomnia.   nebivolol  (BYSTOLIC ) 10 MG tablet TAKE 1 TABLET BY MOUTH EVERY DAY   rosuvastatin  (CRESTOR ) 10 MG tablet TAKE 1 TABLET BY MOUTH EVERY DAY   vitamin E 400 UNIT capsule Take 400 Units by mouth daily.   No facility-administered  encounter medications on file as of 07/04/2024.     ONCOLOGIC FAMILY HISTORY:  Family History  Problem Relation Age of Onset   Hypertension Mother    Hyperlipidemia Mother    Diabetes Mother    Breast cancer Mother 58   Pulmonary embolism Mother    Deep vein thrombosis Mother    Pancreatic cancer Mother 63   Dementia Father    Leukemia Maternal Aunt    Sickle cell anemia Cousin    Colon cancer Neg Hx    Esophageal cancer Neg Hx    Stomach cancer Neg Hx    Colon polyps Neg Hx    Rectal cancer Neg Hx      SOCIAL HISTORY:  Social History   Socioeconomic History   Marital status: Single    Spouse name: Not on file   Number of children: 0   Years of education: 12   Highest education level: Not on file  Occupational History    Comment: [employer]  Tobacco Use   Smoking status: Never   Smokeless tobacco: Never  Vaping Use   Vaping status: Never Used  Substance and Sexual Activity   Alcohol use: No    Alcohol/week: 0.0 standard drinks of alcohol   Drug use: No   Sexual activity: Not on file  Other Topics Concern   Not on file  Social History Narrative   Lives alone   No caffeine use   Social Drivers of Corporate investment banker Strain: Low Risk  (08/29/2023)   Overall Financial Resource Strain (CARDIA)    Difficulty of Paying Living Expenses: Not hard at all  Food Insecurity: No Food Insecurity (08/29/2023)   Hunger Vital Sign    Worried About Running Out of Food in the Last Year: Never true    Ran Out of Food in the Last Year: Never true  Transportation Needs: No Transportation Needs (08/29/2023)   PRAPARE - Administrator, Civil Service (Medical): No    Lack of Transportation (Non-Medical): No  Physical Activity: Inactive (08/29/2023)   Exercise Vital Sign    Days of Exercise per Week: 0 days    Minutes of Exercise per Session: 0 min  Stress: No Stress Concern Present (08/29/2023)   [employer] of Occupational Health - Occupational Stress  Questionnaire    Feeling of Stress : Not at all  Social Connections: Socially Isolated (08/29/2023)   Social Connection and Isolation Panel    Frequency of Communication with Friends and Family: More than three times a week    Frequency of Social Gatherings with Friends and Family: Once a week    Attends Religious Services: Never    Database administrator or Organizations: No  Attends Banker Meetings: Never    Marital Status: Never married  Intimate Partner Violence: Not At Risk (08/29/2023)   Humiliation, Afraid, Rape, and Kick questionnaire    Fear of Current or Ex-Partner: No    Emotionally Abused: No    Physically Abused: No    Sexually Abused: No     OBSERVATIONS/OBJECTIVE:  BP (!) 115/58 (BP Location: Left Arm, Patient Position: Sitting)   Pulse 71   Temp (!) 97.3 F (36.3 C) (Temporal)   Resp 16   Wt 129 lb 9.6 oz (58.8 kg)   LMP  (LMP Unknown)   SpO2 100%   BMI 23.70 kg/m  GENERAL: Patient is a well appearing female in no acute distress HEENT:  Sclerae anicteric.  Oropharynx clear and moist. No ulcerations or evidence of oropharyngeal candidiasis. Neck is supple.  NODES:  No cervical, supraclavicular, or axillary lymphadenopathy palpated.  BREAST EXAM: Right breast status postlumpectomy and radiation no sign of local recurrence left breast is benign. LUNGS:  Clear to auscultation bilaterally.  No wheezes or rhonchi. HEART:  Regular rate and rhythm. No murmur appreciated. ABDOMEN:  Soft, nontender.  Positive, normoactive bowel sounds. No organomegaly palpated. MSK:  No focal spinal tenderness to palpation. Full range of motion bilaterally in the upper extremities. EXTREMITIES:  No peripheral edema.   SKIN:  Clear with no obvious rashes or skin changes. No nail dyscrasia. NEURO:  Nonfocal. Well oriented.  Appropriate affect.   LABORATORY DATA:  None for this visit.  DIAGNOSTIC IMAGING:  None for this visit.      ASSESSMENT AND PLAN:  Ms..  Mancebo is a pleasant 73 y.o. female with Stage 1A right breast invasive ductal carcinoma, ER+/PR+/HER2-, diagnosed in 06/2023, treated with lumpectomy, adjuvant radiation therapy, and anti-estrogen therapy with anastrozole  beginning in 12/2023.     *Total Encounter Time as defined by the Centers for Medicare and Medicaid Services includes, in addition to the face-to-face time of a patient visit (documented in the note above) non-face-to-face time: obtaining and reviewing outside history, ordering and reviewing medications, tests or procedures, care coordination (communications with other health care professionals or caregivers) and documentation in the medical record.

## 2024-07-29 ENCOUNTER — Ambulatory Visit: Admitting: Physician Assistant

## 2024-07-30 ENCOUNTER — Ambulatory Visit: Admitting: Physician Assistant

## 2024-07-30 ENCOUNTER — Encounter: Payer: Self-pay | Admitting: Physician Assistant

## 2024-07-30 VITALS — BP 134/84 | Temp 97.0°F | Ht 62.0 in | Wt 128.8 lb

## 2024-07-30 DIAGNOSIS — M67442 Ganglion, left hand: Secondary | ICD-10-CM | POA: Diagnosis not present

## 2024-07-30 DIAGNOSIS — R7303 Prediabetes: Secondary | ICD-10-CM | POA: Diagnosis not present

## 2024-07-30 DIAGNOSIS — R634 Abnormal weight loss: Secondary | ICD-10-CM

## 2024-07-30 DIAGNOSIS — N1831 Chronic kidney disease, stage 3a: Secondary | ICD-10-CM

## 2024-07-30 DIAGNOSIS — I10 Essential (primary) hypertension: Secondary | ICD-10-CM

## 2024-07-30 DIAGNOSIS — Z23 Encounter for immunization: Secondary | ICD-10-CM

## 2024-07-30 DIAGNOSIS — E559 Vitamin D deficiency, unspecified: Secondary | ICD-10-CM

## 2024-07-30 DIAGNOSIS — F5101 Primary insomnia: Secondary | ICD-10-CM | POA: Diagnosis not present

## 2024-07-30 LAB — URINALYSIS, ROUTINE W REFLEX MICROSCOPIC
Hgb urine dipstick: NEGATIVE
Leukocytes,Ua: NEGATIVE
Nitrite: NEGATIVE
RBC / HPF: NONE SEEN (ref 0–?)
Specific Gravity, Urine: 1.02 (ref 1.000–1.030)
Total Protein, Urine: 30 — AB
Urine Glucose: NEGATIVE
Urobilinogen, UA: 0.2 (ref 0.0–1.0)
pH: 6 (ref 5.0–8.0)

## 2024-07-30 LAB — CBC WITH DIFFERENTIAL/PLATELET
Basophils Absolute: 0 K/uL (ref 0.0–0.1)
Basophils Relative: 0.6 % (ref 0.0–3.0)
Eosinophils Absolute: 0.1 K/uL (ref 0.0–0.7)
Eosinophils Relative: 2.3 % (ref 0.0–5.0)
HCT: 37.5 % (ref 36.0–46.0)
Hemoglobin: 12.3 g/dL (ref 12.0–15.0)
Lymphocytes Relative: 23.9 % (ref 12.0–46.0)
Lymphs Abs: 1 K/uL (ref 0.7–4.0)
MCHC: 32.8 g/dL (ref 30.0–36.0)
MCV: 89.3 fl (ref 78.0–100.0)
Monocytes Absolute: 0.6 K/uL (ref 0.1–1.0)
Monocytes Relative: 13 % — ABNORMAL HIGH (ref 3.0–12.0)
Neutro Abs: 2.6 K/uL (ref 1.4–7.7)
Neutrophils Relative %: 60.2 % (ref 43.0–77.0)
Platelets: 247 K/uL (ref 150.0–400.0)
RBC: 4.2 Mil/uL (ref 3.87–5.11)
RDW: 13.7 % (ref 11.5–15.5)
WBC: 4.3 K/uL (ref 4.0–10.5)

## 2024-07-30 LAB — TSH: TSH: 3.09 u[IU]/mL (ref 0.35–5.50)

## 2024-07-30 LAB — COMPREHENSIVE METABOLIC PANEL WITH GFR
ALT: 30 U/L (ref 0–35)
AST: 37 U/L (ref 0–37)
Albumin: 4.3 g/dL (ref 3.5–5.2)
Alkaline Phosphatase: 92 U/L (ref 39–117)
BUN: 20 mg/dL (ref 6–23)
CO2: 30 meq/L (ref 19–32)
Calcium: 9.6 mg/dL (ref 8.4–10.5)
Chloride: 100 meq/L (ref 96–112)
Creatinine, Ser: 1.27 mg/dL — ABNORMAL HIGH (ref 0.40–1.20)
GFR: 42.58 mL/min — ABNORMAL LOW (ref >60.00)
Glucose, Bld: 73 mg/dL (ref 70–99)
Potassium: 3.2 meq/L — ABNORMAL LOW (ref 3.5–5.1)
Sodium: 139 meq/L (ref 135–145)
Total Bilirubin: 0.4 mg/dL (ref 0.2–1.2)
Total Protein: 7.5 g/dL (ref 6.0–8.3)

## 2024-07-30 LAB — VITAMIN D 25 HYDROXY (VIT D DEFICIENCY, FRACTURES): VITD: 49.18 ng/mL (ref 30.00–100.00)

## 2024-07-30 LAB — MICROALBUMIN / CREATININE URINE RATIO
Creatinine,U: 228 mg/dL
Microalb Creat Ratio: 58.2 mg/g — ABNORMAL HIGH (ref 0.0–30.0)
Microalb, Ur: 13.3 mg/dL — ABNORMAL HIGH (ref 0.0–1.9)

## 2024-07-30 LAB — HEMOGLOBIN A1C: Hgb A1c MFr Bld: 5.9 % (ref 4.6–6.5)

## 2024-07-30 MED ORDER — AMLODIPINE BESYLATE 2.5 MG PO TABS: 2.5000 mg | ORAL_TABLET | Freq: Every day | ORAL | 0 refills | Status: DC

## 2024-07-30 NOTE — Progress Notes (Signed)
 Patient ID: [name] [name], female    DOB: 1950/12/23, 73 y.o.   MRN: [id number]   Assessment & Plan:  Essential hypertension -     Comprehensive metabolic panel with GFR  Abnormal weight loss -     CBC with Differential/Platelet -     Comprehensive metabolic panel with GFR -     TSH -     Hemoglobin A1c -     Microalbumin / creatinine urine ratio -     Urinalysis, Routine w reflex microscopic  Prediabetes -     Hemoglobin A1c  Chronic kidney disease, stage 3a (HCC) -     Microalbumin / creatinine urine ratio -     Urinalysis, Routine w reflex microscopic  Immunization due -     Flu vaccine HIGH DOSE PF(Fluzone Trivalent)  Vitamin D  deficiency -     VITAMIN D  25 Hydroxy (Vit-D Deficiency, Fractures)  Primary insomnia  Ganglion cyst of tendon sheath of left hand -     Ambulatory referral to Sports Medicine  Other orders -     amLODIPine  Besylate; Take 1 tablet (2.5 mg total) by mouth daily.  Dispense: 30 tablet; Refill: 0     Assessment & Plan Essential hypertension with medication adjustment and orthostatic symptoms Hypertension with recent orthostatic symptoms, likely due to weight loss and current medication regimen. Blood pressure readings have been low, with recent episodes of dizziness and weakness. Current medications include amlodipine  5 mg and nebivolol  10 mg. Weight loss of 12 pounds this year may necessitate medication adjustment. - Reduced amlodipine  to 2.5 mg daily. - Continue nebivolol  10 mg daily. - Track blood pressure at home, morning and evening. - Will recheck blood pressure in 4-6 weeks. - Consider compression socks to support blood pressure.  Unintentional weight loss with dietary changes and recent cancer treatment Unintentional weight loss of 12 pounds since January, with dietary changes post-cancer treatment. Weight loss noted after cessation of radiation therapy in March. No exercise or intentional weight loss efforts reported. Possible  contributing factors include dietary restrictions and recent cancer treatment. - Ordered labs to check thyroid  function and kidney function. - Monitor weight and dietary intake.  Dizziness Intermittent dizziness, likely related to orthostatic hypotension and recent weight loss. No orthostatic hypotension noted on exam today. Symptoms include weakness and need to sit during episodes. - Adjusted amlodipine  as per hypertension plan. - Monitor symptoms and blood pressure at home.  Chronic kidney disease Chronic kidney disease noted on labs. No specific stage mentioned. - Ordered labs to recheck kidney function.  Primary insomnia Difficulty falling asleep, likely related to age-related changes in melatonin production. No desire to use prescription sleep aids. Possible contribution from cancer medications. - Recommended over-the-counter melatonin 5-10 mg, taken around 5-6 PM. - Encouraged good sleep hygiene practices.  Ganglion cyst, left hand Ganglion cyst on left hand, increasing in size and causing discomfort. No signs of infection. Possible need for intervention if symptoms worsen. - Referred to sports medicine group for ultrasound and possible drainage of cyst.  Prediabetes A1c of 5.9% noted in June. Dietary changes have been implemented to manage blood sugar levels. - Continue dietary management for prediabetes.  General Health Maintenance Routine health maintenance discussed, including recent flu vaccination and upcoming mammogram. - Scheduled mammogram for next year.     F/up as scheduled in December     Subjective:    Chief Complaint  Patient presents with   Dizziness    Having really bad dizziness  to where she will almost pass out. Things will go dark. Cancer Dr wondering if the meds should be reduced due to weight loss. 102/65 was Oct 23. 95/62 Nov. 11. Was unable to get urine since last visit. There was an hr wait in lab.    Mass    Bump on left hand. First noticed  it in August.     HPI Discussed the use of AI scribe software for clinical note transcription with the patient, who gave verbal consent to proceed.  History of Present Illness [name] [name] is a 73 year old female with prediabetes and chronic kidney disease who presents for a follow-up visit.  She experiences dizziness and feeling unwell, particularly noting episodes where her blood pressure drops significantly. On October 16, her blood pressure was recorded at 115/58 mmHg, and she recalls another instance where it was 95/something. These episodes occur while she is engaged in activities such as cooking, causing her to feel nervous, with weak legs, necessitating her to sit down for 5-10 minutes until the symptoms pass. She has not adjusted her blood pressure medications, which include amlodipine  5 mg and nebivolol  10 mg.  She has experienced a weight loss of 12 pounds since January, dropping from 140 pounds to 128 pounds. She reports that she has been avoiding certain foods like sugar, red meat, and bread, as advised by her medical team. She notes that she is not trying to lose weight and is not engaging in exercise, but the weight is 'just dropping off.'  She reports sleep disturbances, stating she does not get sleepy and can remain awake for hours even when attempting to sleep. She avoids taking sleep medications but occasionally uses lorazepam  for sleep when necessary. She engages in activities like watching TV and doing puzzles during the night.  She mentions a growth on her left hand that has been increasing in size, which she describes as not being a callus and causing discomfort when gripping objects. She is right-handed and notes that the growth is bothersome.  No bleeding issues, abnormal stomach pains, chest pain, or headaches. She notes swelling in her legs only if she sits for extended periods.     Past Medical History:  Diagnosis Date   Anxiety    Breast cancer [facility])     Colonic polyp    Fibromuscular dysplasia of bilateral renal arteries    FMD (facioscapulohumeral muscular dystrophy)  appears to have been entered by mistake in 01/2016. 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 Edited by Darlyn, MD   GERD (gastroesophageal reflux disease)    Headache(784.0)    Hypercholesterolemia    Hypertension    Idiopathic urticaria    Personal history of radiation therapy    Varicose veins of lower extremities     Past Surgical History:  Procedure Laterality Date   AUGMENTATION MAMMAPLASTY Bilateral    benign breast biopsy     bilateral breast implants     BREAST BIOPSY     unsure which breast and when   BREAST BIOPSY Right 07/03/2023   US  RT BREAST BX W LOC DEV EA ADD LESION IMG BX SPEC US  GUIDE 07/03/2023 [facility]   BREAST BIOPSY Right 07/03/2023   US  RT BREAST BX W LOC DEV 1ST LESION IMG BX SPEC US  GUIDE 07/03/2023 [facility]   BREAST BIOPSY Right 08/08/2023   US  RT RADIOACTIVE SEED  EA ADD LESION 08/08/2023 [facility]   BREAST BIOPSY  08/08/2023   US  RT RADIOACTIVE SEED LOC 08/08/2023 [facility]   BREAST IMPLANT REMOVAL Bilateral 08/11/2023   Procedure: BILATERAL REMOVAL BREAST IMPLANTS;  Surgeon: Lowery Estefana RAMAN, DO;  Location: [facility];  Service: Plastics;  Laterality: Bilateral;   BREAST LUMPECTOMY     BREAST LUMPECTOMY WITH RADIOACTIVE SEED LOCALIZATION Right 08/11/2023   Procedure: RIGHT BREAST RADIOACTIVE SEED LOCALIZED LUMPECTOMY x2;  Surgeon: Curvin Deward MOULD, MD;  Location: [facility];  Service: General;  Laterality: Right;   cataract right eye w/ lens implant     COLONOSCOPY  03/20/2023   COLONOSCOPY  2012   Dr Jakie, normal   excision of large anal polyp     MASTOPEXY Bilateral 08/11/2023   Procedure: MASTOPEXY;  Surgeon: Lowery Estefana RAMAN,  DO;  Location: [facility];  Service: Plastics;  Laterality: Bilateral;   PERIPHERAL VASCULAR CATHETERIZATION N/A 02/11/2016   Procedure: Renal Angiography;  Surgeon: Redell LITTIE Door, MD;  Location: [facility] CV [facility];  Service: Cardiovascular;  Laterality: N/A;   PLACEMENT OF BREAST IMPLANTS Bilateral 08/11/2023   Procedure: PLACEMENT OF SILCONE IMPLANTS;  Surgeon: Lowery Estefana RAMAN, DO;  Location: [facility];  Service: Plastics;  Laterality: Bilateral;   POLYPECTOMY     VAGINAL HYSTERECTOMY     varicose vein      Family History  Problem Relation Age of Onset   Hypertension Mother    Hyperlipidemia Mother    Diabetes Mother    Breast cancer Mother 43   Pulmonary embolism Mother    Deep vein thrombosis Mother    Pancreatic cancer Mother 73   Dementia Father    Leukemia Maternal Aunt    Sickle cell anemia Cousin    Colon cancer Neg Hx    Esophageal cancer Neg Hx    Stomach cancer Neg Hx    Colon polyps Neg Hx    Rectal cancer Neg Hx     Social History   Tobacco Use   Smoking status: Never   Smokeless tobacco: Never  Vaping Use   Vaping status: Never Used  Substance Use Topics   Alcohol use: No    Alcohol/week: 0.0 standard drinks of alcohol   Drug use: No     No Known Allergies  Review of Systems NEGATIVE UNLESS OTHERWISE INDICATED IN HPI      Objective:     BP 134/84 (BP Location: Right Arm, Patient Position: Standing)   Temp (!) 97 F (36.1 C) (Temporal)   Ht 5' 2 (1.575 m)   Wt 128 lb 12.8 oz (58.4 kg)   LMP  (LMP Unknown)   SpO2 98%   BMI 23.56 kg/m   Wt Readings from Last 3 Encounters:  07/30/24 128 lb 12.8 oz (58.4 kg)  07/04/24 129 lb 9.6 oz (58.8 kg)  03/19/24 135 lb 3.2 oz (61.3 kg)    BP Readings from Last 3 Encounters:  07/30/24 134/84  07/04/24 (!) 115/58  03/19/24 109/64     Physical Exam          Kristina Bertone M Julicia Krieger, PA-C

## 2024-07-30 NOTE — Patient Instructions (Addendum)
 FRONT DESK PLEASE CHECK IF WE CAN COMBINE SCHEDULING FOR HER IN DECEMBER WITH MY FOLLOW UP VISIT AND HER AWV WITH TINA TO BE ON SAME DAY   VISIT SUMMARY: Today, we addressed your dizziness, weight loss, sleep issues, and a growth on your left hand. We also reviewed your blood pressure and kidney function, and discussed your prediabetes management and general health maintenance.  YOUR PLAN: ESSENTIAL HYPERTENSION WITH ORTHOSTATIC SYMPTOMS: Your blood pressure has been low, causing dizziness and weakness, likely due to weight loss and your current medication. -Reduce amlodipine  to 2.5 mg daily. -Continue nebivolol  10 mg daily. -Track your blood pressure at home, both in the morning and evening. -We will recheck your blood pressure in 4-6 weeks. -Consider wearing compression socks to help support your blood pressure.  UNINTENTIONAL WEIGHT LOSS: You have lost 12 pounds since January, possibly due to dietary changes and recent cancer treatment. -We have ordered labs to check your thyroid  and kidney function. -Monitor your weight and dietary intake.  DIZZINESS: You have been experiencing dizziness, likely related to low blood pressure and recent weight loss. -Monitor your symptoms and blood pressure at home.  CHRONIC KIDNEY DISEASE: You have chronic kidney disease, and we need to monitor your kidney function. -We have ordered labs to recheck your kidney function.  PRIMARY INSOMNIA: You have difficulty falling asleep, likely due to age-related changes in melatonin production. -Try over-the-counter melatonin 5-10 mg, taken around 5-6 PM. -Practice good sleep hygiene.  GANGLION CYST, LEFT HAND: You have a ganglion cyst on your left hand that is causing discomfort. -We have referred you to a sports medicine group for an ultrasound and possible drainage of the cyst.  PREDIABETES: Your A1c was 5.9% in June, and you have made dietary changes to manage your blood sugar levels. -Continue with your  dietary management for prediabetes.  GENERAL HEALTH MAINTENANCE: We discussed routine health maintenance, including your recent flu vaccination and upcoming mammogram. -Your mammogram is scheduled for next year.                      Contains text generated by Abridge.                                 Contains text generated by Abridge.

## 2024-07-31 ENCOUNTER — Ambulatory Visit: Payer: Self-pay | Admitting: Physician Assistant

## 2024-08-06 DIAGNOSIS — H18413 Arcus senilis, bilateral: Secondary | ICD-10-CM | POA: Diagnosis not present

## 2024-08-06 DIAGNOSIS — H26492 Other secondary cataract, left eye: Secondary | ICD-10-CM | POA: Diagnosis not present

## 2024-08-06 DIAGNOSIS — Z961 Presence of intraocular lens: Secondary | ICD-10-CM | POA: Diagnosis not present

## 2024-08-06 DIAGNOSIS — H40013 Open angle with borderline findings, low risk, bilateral: Secondary | ICD-10-CM | POA: Diagnosis not present

## 2024-08-10 ENCOUNTER — Other Ambulatory Visit: Payer: Self-pay | Admitting: Physician Assistant

## 2024-08-12 DIAGNOSIS — Z961 Presence of intraocular lens: Secondary | ICD-10-CM | POA: Diagnosis not present

## 2024-08-28 ENCOUNTER — Other Ambulatory Visit: Payer: Self-pay | Admitting: Physician Assistant

## 2024-09-02 ENCOUNTER — Ambulatory Visit: Admitting: Physician Assistant

## 2024-09-02 ENCOUNTER — Ambulatory Visit

## 2024-09-02 ENCOUNTER — Encounter: Payer: Self-pay | Admitting: Physician Assistant

## 2024-09-02 VITALS — BP 124/72 | HR 62 | Temp 97.8°F | Ht 62.0 in | Wt 128.2 lb

## 2024-09-02 DIAGNOSIS — I773 Arterial fibromuscular dysplasia: Secondary | ICD-10-CM | POA: Diagnosis not present

## 2024-09-02 DIAGNOSIS — I701 Atherosclerosis of renal artery: Secondary | ICD-10-CM | POA: Diagnosis not present

## 2024-09-02 DIAGNOSIS — I10 Essential (primary) hypertension: Secondary | ICD-10-CM

## 2024-09-02 DIAGNOSIS — R7303 Prediabetes: Secondary | ICD-10-CM

## 2024-09-02 DIAGNOSIS — M25522 Pain in left elbow: Secondary | ICD-10-CM

## 2024-09-02 DIAGNOSIS — N1831 Chronic kidney disease, stage 3a: Secondary | ICD-10-CM | POA: Diagnosis not present

## 2024-09-02 NOTE — Progress Notes (Unsigned)
 Patient ID: [name] [name], female    DOB: 06/30/1951, 73 y.o.   MRN: [id number]   Assessment & Plan:  There are no diagnoses linked to this encounter.    Assessment and Plan Assessment & Plan       No follow-ups on file.    Subjective:    Chief Complaint  Patient presents with   Prediabetes    Pt in office to f/u with PCP to discuss Prediabetes; pt c/o left elbow hurting; bone pain for the past month;     HPI Discussed the use of AI scribe software for clinical note transcription with the patient, who gave verbal consent to proceed.  History of Present Illness      Past Medical History:  Diagnosis Date   Anxiety    Breast cancer [facility])    Colonic polyp    Fibromuscular dysplasia of bilateral renal arteries    FMD (facioscapulohumeral muscular dystrophy)  appears to have been entered by mistake in 01/2016. 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 Edited by Darlyn, MD   GERD (gastroesophageal reflux disease)    Headache(784.0)    Hypercholesterolemia    Hypertension    Idiopathic urticaria    Personal history of radiation therapy    Varicose veins of lower extremities     Past Surgical History:  Procedure Laterality Date   AUGMENTATION MAMMAPLASTY Bilateral    benign breast biopsy     bilateral breast implants     BREAST BIOPSY     unsure which breast and when   BREAST BIOPSY Right 07/03/2023   US  RT BREAST BX W LOC DEV EA ADD LESION IMG BX SPEC US  GUIDE 07/03/2023 [facility]   BREAST BIOPSY Right 07/03/2023   US  RT BREAST BX W LOC DEV 1ST LESION IMG BX SPEC US  GUIDE 07/03/2023 [facility]   BREAST BIOPSY Right 08/08/2023   US  RT RADIOACTIVE SEED EA ADD LESION 08/08/2023 [facility]   BREAST BIOPSY  08/08/2023   US  RT RADIOACTIVE SEED LOC 08/08/2023 [facility]   BREAST IMPLANT REMOVAL  Bilateral 08/11/2023   Procedure: BILATERAL REMOVAL BREAST IMPLANTS;  Surgeon: Lowery Estefana RAMAN, DO;  Location: [facility];  Service: Plastics;  Laterality: Bilateral;   BREAST LUMPECTOMY     BREAST LUMPECTOMY WITH RADIOACTIVE SEED LOCALIZATION Right 08/11/2023   Procedure: RIGHT BREAST RADIOACTIVE SEED LOCALIZED LUMPECTOMY x2;  Surgeon: Curvin Deward MOULD, MD;  Location: [facility];  Service: General;  Laterality: Right;   cataract right eye w/ lens implant     COLONOSCOPY  03/20/2023   COLONOSCOPY  2012   Dr Jakie, normal   excision of large anal polyp     MASTOPEXY Bilateral 08/11/2023   Procedure: MASTOPEXY;  Surgeon: Lowery Estefana RAMAN, DO;  Location: [facility];  Service: Plastics;  Laterality: Bilateral;   PERIPHERAL VASCULAR CATHETERIZATION N/A 02/11/2016   Procedure: Renal Angiography;  Surgeon: Redell LITTIE Door, MD;  Location[facility] INVASIVE CV LAB;  Service: Cardiovascular;  Laterality: N/A;   PLACEMENT OF BREAST IMPLANTS Bilateral 08/11/2023   Procedure: PLACEMENT OF SILCONE IMPLANTS;  Surgeon: Lowery Estefana RAMAN, DO;  Location:  SURGERY CENTER;  Service: Plastics;  Laterality: Bilateral;   POLYPECTOMY     VAGINAL HYSTERECTOMY     varicose vein      Family History  Problem Relation Age of Onset  Hypertension Mother    Hyperlipidemia Mother    Diabetes Mother    Breast cancer Mother 53   Pulmonary embolism Mother    Deep vein thrombosis Mother    Pancreatic cancer Mother 36   Dementia Father    Leukemia Maternal Aunt    Sickle cell anemia Cousin    Colon cancer Neg Hx    Esophageal cancer Neg Hx    Stomach cancer Neg Hx    Colon polyps Neg Hx    Rectal cancer Neg Hx     Social History[1]   Allergies[2]  Review of Systems NEGATIVE UNLESS OTHERWISE INDICATED IN HPI      Objective:     BP 124/72 (BP Location: Left Arm, Patient Position: Sitting, Cuff Size: Normal)   Pulse 62   Temp 97.8 F (36.6 C)  (Temporal)   Ht 5' 2 (1.575 m)   Wt 128 lb 3.2 oz (58.2 kg)   LMP  (LMP Unknown)   SpO2 99%   BMI 23.45 kg/m   Wt Readings from Last 3 Encounters:  09/02/24 128 lb 3.2 oz (58.2 kg)  07/30/24 128 lb 12.8 oz (58.4 kg)  07/04/24 129 lb 9.6 oz (58.8 kg)    BP Readings from Last 3 Encounters:  09/02/24 124/72  07/30/24 134/84  07/04/24 (!) 115/58     Physical Exam          Tasheba Henson M Emmalin Jaquess, PA-C    [1]  Social History Tobacco Use   Smoking status: Never   Smokeless tobacco: Never  Vaping Use   Vaping status: Never Used  Substance Use Topics   Alcohol use: No    Alcohol/week: 0.0 standard drinks of alcohol   Drug use: No  [2] No Known Allergies

## 2024-09-04 ENCOUNTER — Other Ambulatory Visit: Payer: Self-pay | Admitting: Physician Assistant

## 2024-09-04 ENCOUNTER — Other Ambulatory Visit: Payer: Self-pay

## 2024-09-04 DIAGNOSIS — I773 Arterial fibromuscular dysplasia: Secondary | ICD-10-CM

## 2024-09-06 ENCOUNTER — Other Ambulatory Visit: Payer: Self-pay | Admitting: Physician Assistant

## 2024-09-09 ENCOUNTER — Ambulatory Visit: Admitting: Physician Assistant

## 2024-09-13 ENCOUNTER — Telehealth: Payer: Self-pay

## 2024-09-13 ENCOUNTER — Other Ambulatory Visit: Payer: Self-pay

## 2024-09-13 DIAGNOSIS — I10 Essential (primary) hypertension: Secondary | ICD-10-CM

## 2024-09-13 MED ORDER — NEBIVOLOL HCL 10 MG PO TABS: 10.0000 mg | ORAL_TABLET | Freq: Every day | ORAL | 1 refills | Status: AC

## 2024-09-13 NOTE — Telephone Encounter (Signed)
 I reached out to patient by phone, RX sent to updated pharmacy due to pharmacy on file not having Bystolic  in stock. Advised patient to reach back out if any other concerns, pt verbalized understanding.

## 2024-09-13 NOTE — Telephone Encounter (Signed)
 Copied from CRM #[id number]. Topic: Clinical - Prescription Issue >> Sep 13, 2024 10:43 AM Carlyon D wrote: Reason for CRM: PT is calling in regards to her being out of medication nebivolol  (BYSTOLIC ) 10 MG tabletion pt states pharmacy is out of that medication. Pt only has one medication to take right now and pt states its the blood pressure med tht was just lowered dosage. Pt would like a call back.

## 2024-09-16 ENCOUNTER — Ambulatory Visit: Payer: Self-pay | Admitting: Physician Assistant

## 2024-09-26 ENCOUNTER — Ambulatory Visit (INDEPENDENT_AMBULATORY_CARE_PROVIDER_SITE_OTHER)

## 2024-09-26 ENCOUNTER — Other Ambulatory Visit: Payer: Self-pay

## 2024-09-26 DIAGNOSIS — Z0000 Encounter for general adult medical examination without abnormal findings: Secondary | ICD-10-CM

## 2024-09-26 DIAGNOSIS — I701 Atherosclerosis of renal artery: Secondary | ICD-10-CM

## 2024-09-26 NOTE — Patient Instructions (Addendum)
 [name] [name],  Thank you for taking the time for your Medicare Wellness Visit. I appreciate your continued commitment to your health goals. Please review the care plan we discussed, and feel free to reach out if I can assist you further.  Please note that Annual Wellness Visits do not include a physical exam. Some assessments may be limited, especially if the visit was conducted virtually. If needed, we may recommend an in-person follow-up with your provider.  Ongoing Care Seeing your primary care provider every 3 to 6 months helps us  monitor your health and provide consistent, personalized care. APPT W/ MARDY DYKE, PA ON 01/02/25 @ 2:30 PM   Referrals If a referral was made during today's visit and you haven't received any updates within two weeks, please contact the referred provider directly to check on the status.  Recommended Screenings:  Health Maintenance  Topic Date Due   COVID-19 Vaccine (10 - 2025-26 season) 05/20/2024   DTaP/Tdap/Td vaccine (2 - Tdap) 03/01/2025*   Breast Cancer Screening  07/02/2025   Medicare Annual Wellness Visit  09/26/2025   Osteoporosis screening with Bone Density Scan  11/14/2026   Colon Cancer Screening  03/19/2030   Pneumococcal Vaccine for age over 20  Completed   Flu Shot  Completed   Hepatitis C Screening  Completed   Zoster (Shingles) Vaccine  Completed   Meningitis B Vaccine  Aged Out  *Topic was postponed. The date shown is not the original due date.     Vision: Annual vision screenings are recommended for early detection of glaucoma, cataracts, and diabetic retinopathy. These exams can also reveal signs of chronic conditions such as diabetes and high blood pressure.  Dental: Annual dental screenings help detect early signs of oral cancer, gum disease, and other conditions linked to overall health, including heart disease and diabetes.  Please see the attached documents for additional preventive care recommendations.   NEXT AWV  09/29/25  @ 3:00 PM IN PERSON

## 2024-09-26 NOTE — Progress Notes (Signed)
 "  Chief Complaint  Patient presents with   Medicare Wellness     Subjective:   CASI WESTERFELD is a 74 y.o. female who presents for a Medicare Annual Wellness Visit.  Visit info / Clinical Intake: Medicare Wellness Visit Type:: Subsequent Annual Wellness Visit Persons participating in visit and providing information:: patient Medicare Wellness Visit Mode:: Telephone If telephone:: video declined Since this visit was completed virtually, some vitals may be partially provided or unavailable. Missing vitals are due to the limitations of the virtual format.: Unable to obtain vitals - no equipment If Telephone or Video please confirm:: I connected with patient using audio/video enable telemedicine. I verified patient identity with two identifiers, discussed telehealth limitations, and patient agreed to proceed. Patient Location:: HOME Provider Location:: OFFICE Interpreter Needed?: No Pre-visit prep was completed: yes AWV questionnaire completed by patient prior to visit?: no Living arrangements:: (!) lives alone Patient's Overall Health Status Rating: good Typical amount of pain: none Does pain affect daily life?: no Are you currently prescribed opioids?: no  Dietary Habits and Nutritional Risks How many meals a day?: 2 Eats fruit and vegetables daily?: yes Most meals are obtained by: preparing own meals In the last 2 weeks, have you had any of the following?: none Diabetic:: no  Functional Status Activities of Daily Living (to include ambulation/medication): Independent Ambulation: Independent Medication Administration: Independent Home Management (perform basic housework or laundry): Independent Manage your own finances?: yes Primary transportation is: driving Concerns about vision?: no *vision screening is required for WT[facility]* ([facility]- [facility]) Concerns about hearing?: no  Fall Screening Falls in the past year?: 0 Number of falls in past year: 0 Was there an  injury with Fall?: 0 Fall Risk Category Calculator: 0 Patient Fall Risk Level: Low Fall Risk  Fall Risk Patient at Risk for Falls Due to: No Fall Risks Fall risk Follow up: Falls evaluation completed; Falls prevention discussed  Home and Transportation Safety: All rugs have non-skid backing?: yes All stairs or steps have railings?: yes Grab bars in the bathtub or shower?: yes Have non-skid surface in bathtub or shower?: yes Good home lighting?: yes Regular seat belt use?: yes Hospital stays in the last year:: no  Cognitive Assessment Difficulty concentrating, remembering, or making decisions? : yes (MEMORY) Will 6CIT or Mini Cog be Completed: yes What year is it?: 0 points What month is it?: 0 points Give patient an address phrase to remember (5 components): [address] [place], [place] About what time is it?: 0 points Count backwards from 20 to 1: 0 points Say the months of the year in reverse: 0 points Repeat the address phrase from earlier: 0 points 6 CIT Score: 0 points  Advance Directives (For Healthcare) Does Patient Have a Medical Advance Directive?: No Type of Advance Directive: Living will; Healthcare Power of Attorney Would patient like information on creating a medical advance directive?: No - Patient declined  Reviewed/Updated  Reviewed/Updated: Reviewed All (Medical, Surgical, Family, Medications, Allergies, Care Teams, Patient Goals)    Allergies (verified) Patient has no known allergies.   Current Medications (verified) Outpatient Encounter Medications as of 09/26/2024  Medication Sig   amLODipine  (NORVASC ) 2.5 MG tablet TAKE 1 TABLET BY MOUTH EVERY DAY   anastrozole  (ARIMIDEX ) 1 MG tablet Take 1 tablet (1 mg total) by mouth daily.   aspirin  EC 81 MG tablet Take 81 mg by mouth daily.   BIOTIN PO Take by mouth.   cholecalciferol (VITAMIN D ) 1000 units tablet Take 1,000  Units by mouth daily.   LORazepam  (ATIVAN ) 0.5 MG tablet Take 1/2 tab at bedtime prn  anxiety and insomnia.   nebivolol  (BYSTOLIC ) 10 MG tablet Take 1 tablet (10 mg total) by mouth daily.   rosuvastatin  (CRESTOR ) 10 MG tablet TAKE 1 TABLET BY MOUTH EVERY DAY   vitamin E 400 UNIT capsule Take 400 Units by mouth daily.   prednisoLONE acetate (PRED FORTE) 1 % ophthalmic suspension 1 drop 4 (four) times daily. (Patient not taking: Reported on 09/26/2024)   No facility-administered encounter medications on file as of 09/26/2024.    History: Past Medical History:  Diagnosis Date   Anxiety    Breast cancer (HCC)    Colonic polyp    Fibromuscular dysplasia of bilateral renal arteries    FMD (facioscapulohumeral muscular dystrophy)  appears to have been entered by mistake in 01/2016. 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 Edited by Darlyn, MD   GERD (gastroesophageal reflux disease)    Headache(784.0)    Hypercholesterolemia    Hypertension    Idiopathic urticaria    Personal history of radiation therapy    Varicose veins of lower extremities    Past Surgical History:  Procedure Laterality Date   AUGMENTATION MAMMAPLASTY Bilateral    benign breast biopsy     bilateral breast implants     BREAST BIOPSY     unsure which breast and when   BREAST BIOPSY Right 07/03/2023   US  RT BREAST BX W LOC DEV EA ADD LESION IMG BX SPEC US  GUIDE 07/03/2023 [facility]   BREAST BIOPSY Right 07/03/2023   US  RT BREAST BX W LOC DEV 1ST LESION IMG BX SPEC US  GUIDE 07/03/2023 [facility]   BREAST BIOPSY Right 08/08/2023   US  RT RADIOACTIVE SEED EA ADD LESION 08/08/2023 [facility]   BREAST BIOPSY  08/08/2023   US  RT RADIOACTIVE SEED LOC 08/08/2023 [facility]   BREAST IMPLANT REMOVAL Bilateral 08/11/2023   Procedure: BILATERAL REMOVAL BREAST IMPLANTS;  Surgeon: Lowery Estefana RAMAN, DO;  Location: [facility];  Service:  Plastics;  Laterality: Bilateral;   BREAST LUMPECTOMY     BREAST LUMPECTOMY WITH RADIOACTIVE SEED LOCALIZATION Right 08/11/2023   Procedure: RIGHT BREAST RADIOACTIVE SEED LOCALIZED LUMPECTOMY x2;  Surgeon: Curvin Deward MOULD, MD;  Location: [facility];  Service: General;  Laterality: Right;   cataract right eye w/ lens implant     COLONOSCOPY  03/20/2023   COLONOSCOPY  2012   Dr Jakie, normal   excision of large anal polyp     MASTOPEXY Bilateral 08/11/2023   Procedure: MASTOPEXY;  Surgeon: Lowery Estefana RAMAN, DO;  Location: [facility];  Service: Plastics;  Laterality: Bilateral;   PERIPHERAL VASCULAR CATHETERIZATION N/A 02/11/2016   Procedure: Renal Angiography;  Surgeon: Redell LITTIE Door, MD;  Location: [facility] INVASIVE CV LAB;  Service: Cardiovascular;  Laterality: N/A;   PLACEMENT OF BREAST IMPLANTS Bilateral 08/11/2023   Procedure: PLACEMENT OF SILCONE IMPLANTS;  Surgeon: Lowery Estefana RAMAN, DO;  Location: [facility];  Service: Plastics;  Laterality: Bilateral;   POLYPECTOMY     VAGINAL HYSTERECTOMY     varicose vein     Family History  Problem Relation Age of Onset   Hypertension Mother    Hyperlipidemia Mother    Diabetes Mother    Breast cancer Mother 39   Pulmonary embolism Mother  Deep vein thrombosis Mother    Pancreatic cancer Mother 47   Dementia Father    Leukemia Maternal Aunt    Sickle cell anemia Cousin    Colon cancer Neg Hx    Esophageal cancer Neg Hx    Stomach cancer Neg Hx    Colon polyps Neg Hx    Rectal cancer Neg Hx    Social History   Occupational History    Comment: [employer]  Tobacco Use   Smoking status: Never   Smokeless tobacco: Never  Vaping Use   Vaping status: Never Used  Substance and Sexual Activity   Alcohol use: No    Alcohol/week: 0.0 standard drinks of alcohol   Drug use: No   Sexual activity: Not on file   Tobacco Counseling Counseling given: Not Answered  SDOH Screenings   Food  Insecurity: No Food Insecurity (09/26/2024)  Housing: Unknown (09/26/2024)  Transportation Needs: No Transportation Needs (09/26/2024)  Utilities: Not At Risk (09/26/2024)  Depression (PHQ2-9): Low Risk (09/26/2024)  Financial Resource Strain: Low Risk (08/29/2023)  Physical Activity: Insufficiently Active (09/26/2024)  Social Connections: Socially Isolated (09/26/2024)  Stress: No Stress Concern Present (09/26/2024)  Tobacco Use: Low Risk (09/26/2024)  Health Literacy: Adequate Health Literacy (09/26/2024)   See flowsheets for full screening details  Depression Screen PHQ 2 & 9 Depression Scale- Over the past 2 weeks, how often have you been bothered by any of the following problems? Little interest or pleasure in doing things: 0 Feeling down, depressed, or hopeless (PHQ Adolescent also includes...irritable): 0 PHQ-2 Total Score: 0 Trouble falling or staying asleep, or sleeping too much: 0 Feeling tired or having little energy: 0 Poor appetite or overeating (PHQ Adolescent also includes...weight loss): 0 Feeling bad about yourself - or that you are a failure or have let yourself or your family down: 0 Trouble concentrating on things, such as reading the newspaper or watching television (PHQ Adolescent also includes...like school work): 0 Moving or speaking so slowly that other people could have noticed. Or the opposite - being so fidgety or restless that you have been moving around a lot more than usual: 0 Thoughts that you would be better off dead, or of hurting yourself in some way: 0 PHQ-9 Total Score: 0 If you checked off any problems, how difficult have these problems made it for you to do your work, take care of things at home, or get along with other people?: Not difficult at all  Depression Treatment Depression Interventions/Treatment : EYV7-0 Score <4 Follow-up Not Indicated     Goals Addressed             This Visit's Progress    DIET - EAT MORE FRUITS AND VEGETABLES                Objective:    There were no vitals filed for this visit. There is no height or weight on file to calculate BMI.  Hearing/Vision screen Hearing Screening - Comments:: NO AIDS Vision Screening - Comments:: [facility]  Immunizations and Health Maintenance Health Maintenance  Topic Date Due   COVID-19 Vaccine (10 - 2025-26 season) 05/20/2024   DTaP/Tdap/Td (2 - Tdap) 03/01/2025 (Originally 07/14/2020)   Mammogram  07/02/2025   Medicare Annual Wellness (AWV)  09/26/2025   Bone Density Scan  11/14/2025   Colonoscopy  03/19/2030   Pneumococcal Vaccine: 50+ Years  Completed   Influenza Vaccine  Completed   Hepatitis C Screening  Completed   Zoster Vaccines- Shingrix  Completed   Meningococcal B Vaccine  Aged Out        Assessment/Plan:  This is a routine wellness examination for Betul.  Patient Care Team: Allwardt, Mardy HERO, PA-C as PCP - General (Physician Assistant) Sarrah Browning, MD as Consulting Physician (Obstetrics and Gynecology) Curvin Deward MOULD, MD as Consulting Physician (General Surgery) Loretha Ash, MD as Consulting Physician (Hematology and Oncology) Dewey Rush, MD as Consulting Physician (Radiation Oncology) [facility], [place]  I have personally reviewed and noted the following in the patients chart:   Medical and social history Use of alcohol, tobacco or illicit drugs  Current medications and supplements including opioid prescriptions. Functional ability and status Nutritional status Physical activity Advanced directives List of other physicians Hospitalizations, surgeries, and ER visits in previous 12 months Vitals Screenings to include cognitive, depression, and falls Referrals and appointments  No orders of the defined types were placed in this encounter.  In addition, I have reviewed and discussed with patient certain preventive protocols, quality metrics, and best practice recommendations. A written personalized care  plan for preventive services as well as general preventive health recommendations were provided to patient.   Jhonnie GORMAN Das, LPN   04/19/7972   Return in 1 year (on 09/26/2025).  After Visit Summary: (MyChart) Due to this being a telephonic visit, the after visit summary with patients personalized plan was offered to patient via MyChart   Nurse Notes: UTD ON SHOTS EXCEPT TDAP; UTD ON MAMMOGRAM, COLONOSCOPY & BDS    "

## 2024-10-21 ENCOUNTER — Ambulatory Visit (HOSPITAL_COMMUNITY): Admitting: Surgery

## 2024-10-21 ENCOUNTER — Ambulatory Visit (HOSPITAL_COMMUNITY)

## 2024-11-04 ENCOUNTER — Ambulatory Visit (HOSPITAL_COMMUNITY)

## 2024-11-13 ENCOUNTER — Encounter: Admitting: Vascular Surgery

## 2025-01-02 ENCOUNTER — Ambulatory Visit: Admitting: Physician Assistant

## 2025-03-25 ENCOUNTER — Ambulatory Visit: Admitting: Plastic Surgery

## 2025-07-04 ENCOUNTER — Inpatient Hospital Stay: Admitting: Hematology and Oncology

## 2025-09-29 ENCOUNTER — Ambulatory Visit
